# Patient Record
Sex: Male | Born: 1935 | Race: White | Hispanic: No | Smoking: Former smoker
Health system: Southern US, Community
[De-identification: ages and names within clinical notes are randomized; demographics above are authoritative.]

## PROBLEM LIST (undated history)

## (undated) DIAGNOSIS — E785 Hyperlipidemia, unspecified: Secondary | ICD-10-CM

## (undated) DIAGNOSIS — E119 Type 2 diabetes mellitus without complications: Secondary | ICD-10-CM

## (undated) DIAGNOSIS — I6529 Occlusion and stenosis of unspecified carotid artery: Secondary | ICD-10-CM

## (undated) DIAGNOSIS — G309 Alzheimer's disease, unspecified: Secondary | ICD-10-CM

## (undated) DIAGNOSIS — M109 Gout, unspecified: Principal | ICD-10-CM

## (undated) DIAGNOSIS — F419 Anxiety disorder, unspecified: Secondary | ICD-10-CM

## (undated) DIAGNOSIS — I639 Cerebral infarction, unspecified: Secondary | ICD-10-CM

## (undated) DIAGNOSIS — C801 Malignant (primary) neoplasm, unspecified: Secondary | ICD-10-CM

## (undated) DIAGNOSIS — I4891 Unspecified atrial fibrillation: Secondary | ICD-10-CM

## (undated) DIAGNOSIS — F0280 Dementia in other diseases classified elsewhere without behavioral disturbance: Secondary | ICD-10-CM

## (undated) DIAGNOSIS — F0390 Unspecified dementia without behavioral disturbance: Secondary | ICD-10-CM

## (undated) DIAGNOSIS — I10 Essential (primary) hypertension: Secondary | ICD-10-CM

## (undated) DIAGNOSIS — I2510 Atherosclerotic heart disease of native coronary artery without angina pectoris: Secondary | ICD-10-CM

## (undated) HISTORY — DX: Unspecified atrial fibrillation: I48.91

## (undated) HISTORY — DX: Cerebral infarction, unspecified: I63.9

## (undated) HISTORY — DX: Essential (primary) hypertension: I10

## (undated) HISTORY — PX: PEG PLACEMENT: SHX5437

## (undated) HISTORY — PX: CHOLECYSTECTOMY: SHX55

## (undated) HISTORY — DX: Hyperlipidemia, unspecified: E78.5

## (undated) HISTORY — DX: Malignant (primary) neoplasm, unspecified: C80.1

## (undated) HISTORY — DX: Atherosclerotic heart disease of native coronary artery without angina pectoris: I25.10

## (undated) HISTORY — DX: Unspecified dementia, unspecified severity, without behavioral disturbance, psychotic disturbance, mood disturbance, and anxiety: F03.90

## (undated) HISTORY — DX: Gout, unspecified: M10.9

## (undated) HISTORY — DX: Anxiety disorder, unspecified: F41.9

## (undated) HISTORY — PX: CAROTID ENDARTERECTOMY: SUR193

## (undated) HISTORY — DX: Occlusion and stenosis of unspecified carotid artery: I65.29

## (undated) HISTORY — DX: Type 2 diabetes mellitus without complications: E11.9

---

## 1994-02-04 HISTORY — PX: ENDARTERECTOMY: SHX5162

## 1995-07-06 HISTORY — PX: OTHER SURGICAL HISTORY: SHX169

## 1997-02-04 ENCOUNTER — Encounter: Payer: Self-pay | Admitting: Family Medicine

## 1997-02-04 LAB — HM SIGMOIDOSCOPY

## 1997-02-04 LAB — CONVERTED CEMR LAB: PSA: 0.4 ng/mL

## 1998-04-05 ENCOUNTER — Encounter: Payer: Self-pay | Admitting: Family Medicine

## 1998-04-05 LAB — CONVERTED CEMR LAB: PSA: 0.4 ng/mL

## 1999-06-05 ENCOUNTER — Encounter: Payer: Self-pay | Admitting: Family Medicine

## 2000-05-05 ENCOUNTER — Encounter: Payer: Self-pay | Admitting: Family Medicine

## 2001-06-03 ENCOUNTER — Emergency Department (HOSPITAL_COMMUNITY): Admission: EM | Admit: 2001-06-03 | Discharge: 2001-06-03 | Payer: Self-pay | Admitting: Emergency Medicine

## 2001-06-04 ENCOUNTER — Encounter: Payer: Self-pay | Admitting: Family Medicine

## 2001-06-04 LAB — CONVERTED CEMR LAB: PSA: 0.4 ng/mL

## 2001-07-07 ENCOUNTER — Encounter: Payer: Self-pay | Admitting: Vascular Surgery

## 2001-07-08 ENCOUNTER — Ambulatory Visit (HOSPITAL_COMMUNITY): Admission: RE | Admit: 2001-07-08 | Discharge: 2001-07-08 | Payer: Self-pay | Admitting: Vascular Surgery

## 2002-03-09 ENCOUNTER — Encounter: Payer: Self-pay | Admitting: Vascular Surgery

## 2002-03-11 ENCOUNTER — Encounter (INDEPENDENT_AMBULATORY_CARE_PROVIDER_SITE_OTHER): Payer: Self-pay | Admitting: *Deleted

## 2002-03-11 ENCOUNTER — Inpatient Hospital Stay (HOSPITAL_COMMUNITY): Admission: RE | Admit: 2002-03-11 | Discharge: 2002-03-12 | Payer: Self-pay | Admitting: Vascular Surgery

## 2002-06-05 ENCOUNTER — Encounter: Payer: Self-pay | Admitting: Family Medicine

## 2002-08-16 ENCOUNTER — Encounter: Payer: Self-pay | Admitting: Gastroenterology

## 2002-08-16 DIAGNOSIS — D126 Benign neoplasm of colon, unspecified: Secondary | ICD-10-CM

## 2003-06-05 ENCOUNTER — Encounter: Payer: Self-pay | Admitting: Family Medicine

## 2003-12-16 ENCOUNTER — Ambulatory Visit: Payer: Self-pay | Admitting: Family Medicine

## 2004-01-27 ENCOUNTER — Ambulatory Visit: Payer: Self-pay | Admitting: Family Medicine

## 2004-06-04 ENCOUNTER — Encounter: Payer: Self-pay | Admitting: Family Medicine

## 2004-06-04 LAB — CONVERTED CEMR LAB: PSA: 0.44 ng/mL

## 2004-06-07 ENCOUNTER — Ambulatory Visit: Payer: Self-pay | Admitting: Family Medicine

## 2004-06-13 ENCOUNTER — Ambulatory Visit: Payer: Self-pay | Admitting: Family Medicine

## 2004-07-24 ENCOUNTER — Ambulatory Visit: Payer: Self-pay | Admitting: Family Medicine

## 2004-08-11 ENCOUNTER — Emergency Department (HOSPITAL_COMMUNITY): Admission: EM | Admit: 2004-08-11 | Discharge: 2004-08-11 | Payer: Self-pay | Admitting: Emergency Medicine

## 2004-08-14 ENCOUNTER — Ambulatory Visit: Payer: Self-pay | Admitting: Family Medicine

## 2004-09-04 ENCOUNTER — Ambulatory Visit: Payer: Self-pay | Admitting: Family Medicine

## 2004-09-04 ENCOUNTER — Ambulatory Visit: Payer: Self-pay | Admitting: Professional

## 2004-09-06 ENCOUNTER — Ambulatory Visit: Payer: Self-pay | Admitting: Family Medicine

## 2004-09-11 ENCOUNTER — Ambulatory Visit: Payer: Self-pay | Admitting: Professional

## 2004-11-02 ENCOUNTER — Ambulatory Visit: Payer: Self-pay | Admitting: Family Medicine

## 2004-11-06 ENCOUNTER — Ambulatory Visit: Payer: Self-pay | Admitting: Family Medicine

## 2005-01-14 ENCOUNTER — Ambulatory Visit: Payer: Self-pay | Admitting: Family Medicine

## 2005-02-07 ENCOUNTER — Ambulatory Visit: Payer: Self-pay | Admitting: Family Medicine

## 2005-02-12 ENCOUNTER — Ambulatory Visit: Payer: Self-pay | Admitting: Family Medicine

## 2005-03-08 ENCOUNTER — Ambulatory Visit: Payer: Self-pay | Admitting: Family Medicine

## 2005-06-12 ENCOUNTER — Ambulatory Visit: Payer: Self-pay | Admitting: Family Medicine

## 2005-06-12 LAB — CONVERTED CEMR LAB
Hgb A1c MFr Bld: 5.6 %
Microalbumin U total vol: 9.7 mg/L
PSA: 0.81 ng/mL

## 2005-06-14 ENCOUNTER — Ambulatory Visit: Payer: Self-pay | Admitting: Family Medicine

## 2005-07-02 ENCOUNTER — Ambulatory Visit: Payer: Self-pay | Admitting: Family Medicine

## 2005-12-16 ENCOUNTER — Ambulatory Visit: Payer: Self-pay | Admitting: Family Medicine

## 2006-06-16 ENCOUNTER — Encounter: Payer: Self-pay | Admitting: Family Medicine

## 2006-06-16 DIAGNOSIS — E785 Hyperlipidemia, unspecified: Secondary | ICD-10-CM

## 2006-06-16 DIAGNOSIS — F528 Other sexual dysfunction not due to a substance or known physiological condition: Secondary | ICD-10-CM

## 2006-06-16 DIAGNOSIS — I2510 Atherosclerotic heart disease of native coronary artery without angina pectoris: Secondary | ICD-10-CM

## 2006-06-16 DIAGNOSIS — Z862 Personal history of diseases of the blood and blood-forming organs and certain disorders involving the immune mechanism: Secondary | ICD-10-CM

## 2006-06-16 DIAGNOSIS — Z87898 Personal history of other specified conditions: Secondary | ICD-10-CM

## 2006-06-16 DIAGNOSIS — Z8639 Personal history of other endocrine, nutritional and metabolic disease: Secondary | ICD-10-CM

## 2006-06-16 DIAGNOSIS — F411 Generalized anxiety disorder: Secondary | ICD-10-CM

## 2006-06-16 DIAGNOSIS — I10 Essential (primary) hypertension: Secondary | ICD-10-CM

## 2006-06-16 DIAGNOSIS — I6529 Occlusion and stenosis of unspecified carotid artery: Secondary | ICD-10-CM

## 2006-06-18 ENCOUNTER — Ambulatory Visit: Payer: Self-pay | Admitting: Family Medicine

## 2006-06-18 DIAGNOSIS — E119 Type 2 diabetes mellitus without complications: Secondary | ICD-10-CM

## 2006-06-18 DIAGNOSIS — N400 Enlarged prostate without lower urinary tract symptoms: Secondary | ICD-10-CM

## 2006-06-18 HISTORY — DX: Type 2 diabetes mellitus without complications: E11.9

## 2006-06-18 LAB — CONVERTED CEMR LAB
ALT: 23 units/L (ref 0–40)
AST: 27 units/L (ref 0–37)
Albumin: 3.9 g/dL (ref 3.5–5.2)
BUN: 24 mg/dL — ABNORMAL HIGH (ref 6–23)
Basophils Absolute: 0.1 10*3/uL (ref 0.0–0.1)
Bilirubin, Direct: 0.1 mg/dL (ref 0.0–0.3)
Calcium: 9.3 mg/dL (ref 8.4–10.5)
Chloride: 103 meq/L (ref 96–112)
Cholesterol: 189 mg/dL (ref 0–200)
Eosinophils Absolute: 0.3 10*3/uL (ref 0.0–0.6)
GFR calc Af Amer: 68 mL/min
GFR calc non Af Amer: 57 mL/min
Glucose, Bld: 114 mg/dL — ABNORMAL HIGH (ref 70–99)
HDL: 29.3 mg/dL — ABNORMAL LOW (ref >39.0)
Lymphocytes Relative: 21.6 % (ref 12.0–46.0)
MCHC: 34.3 g/dL (ref 30.0–36.0)
MCV: 90.9 fL (ref 78.0–100.0)
Microalb Creat Ratio: 7.5 mg/g (ref 0.0–30.0)
Monocytes Relative: 14.4 % — ABNORMAL HIGH (ref 3.0–11.0)
Neutro Abs: 4.2 10*3/uL (ref 1.4–7.7)
PSA: 0.73 ng/mL (ref 0.10–4.00)
Platelets: 247 10*3/uL (ref 150–400)
RBC: 4.86 M/uL (ref 4.22–5.81)
TSH: 2.28 microintl units/mL (ref 0.35–5.50)
Total CHOL/HDL Ratio: 6.5
Triglycerides: 182 mg/dL — ABNORMAL HIGH (ref 0–149)
WBC: 7.3 10*3/uL (ref 4.5–10.5)

## 2006-06-20 ENCOUNTER — Ambulatory Visit: Payer: Self-pay | Admitting: Family Medicine

## 2006-06-22 LAB — CONVERTED CEMR LAB: OCCULT 3: NEGATIVE

## 2006-06-23 LAB — CONVERTED CEMR LAB: OCCULT 2: NEGATIVE

## 2006-06-24 ENCOUNTER — Ambulatory Visit: Payer: Self-pay | Admitting: Vascular Surgery

## 2006-07-07 ENCOUNTER — Ambulatory Visit: Payer: Self-pay | Admitting: Family Medicine

## 2006-12-17 ENCOUNTER — Encounter (INDEPENDENT_AMBULATORY_CARE_PROVIDER_SITE_OTHER): Payer: Self-pay | Admitting: Internal Medicine

## 2006-12-17 ENCOUNTER — Emergency Department (HOSPITAL_COMMUNITY): Admission: EM | Admit: 2006-12-17 | Discharge: 2006-12-17 | Payer: Self-pay | Admitting: Orthopedic Surgery

## 2006-12-18 ENCOUNTER — Ambulatory Visit: Payer: Self-pay | Admitting: Family Medicine

## 2006-12-18 DIAGNOSIS — R42 Dizziness and giddiness: Secondary | ICD-10-CM | POA: Insufficient documentation

## 2006-12-24 ENCOUNTER — Ambulatory Visit: Payer: Self-pay | Admitting: Family Medicine

## 2006-12-26 ENCOUNTER — Telehealth: Payer: Self-pay | Admitting: Family Medicine

## 2006-12-29 ENCOUNTER — Encounter (INDEPENDENT_AMBULATORY_CARE_PROVIDER_SITE_OTHER): Payer: Self-pay | Admitting: *Deleted

## 2007-01-27 ENCOUNTER — Telehealth: Payer: Self-pay | Admitting: Family Medicine

## 2007-04-13 ENCOUNTER — Ambulatory Visit: Payer: Self-pay | Admitting: Family Medicine

## 2007-04-13 DIAGNOSIS — M545 Low back pain: Secondary | ICD-10-CM

## 2007-04-14 ENCOUNTER — Encounter: Admission: RE | Admit: 2007-04-14 | Discharge: 2007-04-14 | Payer: Self-pay | Admitting: Family Medicine

## 2007-06-05 HISTORY — PX: OTHER SURGICAL HISTORY: SHX169

## 2007-06-23 ENCOUNTER — Telehealth: Payer: Self-pay | Admitting: Family Medicine

## 2007-06-23 ENCOUNTER — Ambulatory Visit: Payer: Self-pay | Admitting: Vascular Surgery

## 2007-06-24 ENCOUNTER — Ambulatory Visit: Payer: Self-pay | Admitting: Family Medicine

## 2007-07-09 ENCOUNTER — Ambulatory Visit: Payer: Self-pay | Admitting: Family Medicine

## 2007-09-04 ENCOUNTER — Ambulatory Visit: Payer: Self-pay | Admitting: Gastroenterology

## 2007-09-04 DIAGNOSIS — K219 Gastro-esophageal reflux disease without esophagitis: Secondary | ICD-10-CM

## 2007-09-16 LAB — HM COLONOSCOPY

## 2007-09-17 ENCOUNTER — Encounter: Payer: Self-pay | Admitting: Gastroenterology

## 2007-09-17 ENCOUNTER — Ambulatory Visit: Payer: Self-pay | Admitting: Gastroenterology

## 2007-09-22 ENCOUNTER — Encounter: Payer: Self-pay | Admitting: Gastroenterology

## 2007-11-04 ENCOUNTER — Telehealth: Payer: Self-pay | Admitting: Family Medicine

## 2007-12-02 LAB — HM DIABETES EYE EXAM

## 2007-12-07 ENCOUNTER — Ambulatory Visit: Payer: Self-pay | Admitting: Family Medicine

## 2007-12-07 LAB — CONVERTED CEMR LAB
ALT: 19 units/L (ref 0–53)
AST: 25 units/L (ref 0–37)
Albumin: 4.1 g/dL (ref 3.5–5.2)
Alkaline Phosphatase: 71 units/L (ref 39–117)
BUN: 12 mg/dL (ref 6–23)
Basophils Relative: 0.4 % (ref 0.0–3.0)
Chloride: 95 meq/L — ABNORMAL LOW (ref 96–112)
Creatinine, Ser: 1.1 mg/dL (ref 0.4–1.5)
Eosinophils Relative: 2.5 % (ref 0.0–5.0)
Glucose, Bld: 103 mg/dL — ABNORMAL HIGH (ref 70–99)
HCT: 48.1 % (ref 39.0–52.0)
HDL: 40.3 mg/dL (ref >39.0)
Monocytes Relative: 9.3 % (ref 3.0–12.0)
Neutrophils Relative %: 67.7 % (ref 43.0–77.0)
PSA: 0.65 ng/mL (ref 0.10–4.00)
Platelets: 266 10*3/uL (ref 150–400)
Potassium: 4.4 meq/L (ref 3.5–5.1)
RBC: 5.13 M/uL (ref 4.22–5.81)
Total CHOL/HDL Ratio: 4.6
Total Protein: 7.6 g/dL (ref 6.0–8.3)
VLDL: 32 mg/dL (ref 0–40)
WBC: 8.4 10*3/uL (ref 4.5–10.5)

## 2007-12-10 ENCOUNTER — Ambulatory Visit: Payer: Self-pay | Admitting: Family Medicine

## 2007-12-24 ENCOUNTER — Encounter: Payer: Self-pay | Admitting: Family Medicine

## 2008-01-19 ENCOUNTER — Ambulatory Visit: Payer: Self-pay | Admitting: Vascular Surgery

## 2008-02-26 ENCOUNTER — Ambulatory Visit: Payer: Self-pay | Admitting: Family Medicine

## 2008-03-22 ENCOUNTER — Encounter: Payer: Self-pay | Admitting: Family Medicine

## 2008-05-05 ENCOUNTER — Telehealth: Payer: Self-pay | Admitting: Family Medicine

## 2008-06-02 ENCOUNTER — Telehealth: Payer: Self-pay | Admitting: Family Medicine

## 2008-10-06 ENCOUNTER — Telehealth: Payer: Self-pay | Admitting: Family Medicine

## 2008-12-09 ENCOUNTER — Ambulatory Visit: Payer: Self-pay | Admitting: Family Medicine

## 2008-12-09 LAB — CONVERTED CEMR LAB
ALT: 14 units/L (ref 0–53)
AST: 19 units/L (ref 0–37)
BUN: 16 mg/dL (ref 6–23)
Basophils Relative: 0.5 % (ref 0.0–3.0)
CO2: 30 meq/L (ref 19–32)
Chloride: 97 meq/L (ref 96–112)
Cholesterol: 136 mg/dL (ref 0–200)
Creatinine,U: 117.1 mg/dL
Eosinophils Absolute: 0.3 10*3/uL (ref 0.0–0.7)
Glucose, Bld: 104 mg/dL — ABNORMAL HIGH (ref 70–99)
Hemoglobin: 14.7 g/dL (ref 13.0–17.0)
LDL Cholesterol: 69 mg/dL (ref 0–99)
Lymphocytes Relative: 22.1 % (ref 12.0–46.0)
MCHC: 35.2 g/dL (ref 30.0–36.0)
Microalb Creat Ratio: 17.9 mg/g (ref 0.0–30.0)
Monocytes Relative: 11.7 % (ref 3.0–12.0)
Neutro Abs: 5.1 10*3/uL (ref 1.4–7.7)
Potassium: 4.4 meq/L (ref 3.5–5.1)
RBC: 4.45 M/uL (ref 4.22–5.81)
Total Bilirubin: 1.2 mg/dL (ref 0.3–1.2)
Total Protein: 7.3 g/dL (ref 6.0–8.3)

## 2008-12-11 LAB — CONVERTED CEMR LAB: Vit D, 25-Hydroxy: 29 ng/mL — ABNORMAL LOW (ref 30–89)

## 2008-12-13 ENCOUNTER — Ambulatory Visit: Payer: Self-pay | Admitting: Family Medicine

## 2009-02-01 ENCOUNTER — Ambulatory Visit: Payer: Self-pay | Admitting: Vascular Surgery

## 2009-02-07 ENCOUNTER — Telehealth: Payer: Self-pay | Admitting: Family Medicine

## 2009-03-14 ENCOUNTER — Ambulatory Visit: Payer: Self-pay | Admitting: Family Medicine

## 2009-03-30 ENCOUNTER — Ambulatory Visit: Payer: Self-pay | Admitting: Family Medicine

## 2009-03-30 DIAGNOSIS — R5381 Other malaise: Secondary | ICD-10-CM

## 2009-03-30 DIAGNOSIS — R5383 Other fatigue: Secondary | ICD-10-CM

## 2009-03-30 DIAGNOSIS — R011 Cardiac murmur, unspecified: Secondary | ICD-10-CM

## 2009-03-30 LAB — CONVERTED CEMR LAB
Eosinophils Relative: 2.1 % (ref 0.0–5.0)
Glucose, Bld: 108 mg/dL — ABNORMAL HIGH (ref 70–99)
Lymphocytes Relative: 19.4 % (ref 12.0–46.0)
Monocytes Relative: 10.9 % (ref 3.0–12.0)
Neutrophils Relative %: 67.4 % (ref 43.0–77.0)
Platelets: 223 10*3/uL (ref 150.0–400.0)
WBC: 9.8 10*3/uL (ref 4.5–10.5)

## 2009-04-13 ENCOUNTER — Ambulatory Visit: Payer: Self-pay | Admitting: Family Medicine

## 2009-04-13 DIAGNOSIS — F0390 Unspecified dementia without behavioral disturbance: Secondary | ICD-10-CM | POA: Insufficient documentation

## 2009-04-13 DIAGNOSIS — H9209 Otalgia, unspecified ear: Secondary | ICD-10-CM | POA: Insufficient documentation

## 2009-04-17 ENCOUNTER — Telehealth: Payer: Self-pay | Admitting: Family Medicine

## 2009-05-04 ENCOUNTER — Ambulatory Visit: Payer: Self-pay | Admitting: Family Medicine

## 2009-05-10 ENCOUNTER — Telehealth: Payer: Self-pay | Admitting: Family Medicine

## 2009-05-16 ENCOUNTER — Telehealth: Payer: Self-pay | Admitting: Family Medicine

## 2009-05-17 ENCOUNTER — Ambulatory Visit: Payer: Self-pay | Admitting: Family Medicine

## 2009-05-18 ENCOUNTER — Encounter: Payer: Self-pay | Admitting: Family Medicine

## 2009-05-24 ENCOUNTER — Ambulatory Visit: Payer: Self-pay | Admitting: Family Medicine

## 2009-05-24 ENCOUNTER — Telehealth: Payer: Self-pay | Admitting: Family Medicine

## 2009-07-04 ENCOUNTER — Ambulatory Visit: Payer: Self-pay | Admitting: Family Medicine

## 2009-07-26 ENCOUNTER — Ambulatory Visit: Payer: Self-pay | Admitting: Family Medicine

## 2009-09-11 ENCOUNTER — Telehealth: Payer: Self-pay | Admitting: Family Medicine

## 2009-09-12 ENCOUNTER — Encounter (INDEPENDENT_AMBULATORY_CARE_PROVIDER_SITE_OTHER): Payer: Self-pay | Admitting: *Deleted

## 2009-10-22 ENCOUNTER — Emergency Department (HOSPITAL_COMMUNITY): Admission: EM | Admit: 2009-10-22 | Discharge: 2009-10-22 | Payer: Self-pay | Admitting: Family Medicine

## 2009-10-26 ENCOUNTER — Ambulatory Visit: Payer: Self-pay | Admitting: Family Medicine

## 2009-10-26 DIAGNOSIS — K644 Residual hemorrhoidal skin tags: Secondary | ICD-10-CM | POA: Insufficient documentation

## 2009-11-02 ENCOUNTER — Telehealth: Payer: Self-pay | Admitting: Family Medicine

## 2010-01-03 ENCOUNTER — Ambulatory Visit: Payer: Self-pay | Admitting: Family Medicine

## 2010-01-11 ENCOUNTER — Telehealth: Payer: Self-pay | Admitting: Family Medicine

## 2010-02-04 DIAGNOSIS — M109 Gout, unspecified: Secondary | ICD-10-CM

## 2010-02-04 HISTORY — DX: Gout, unspecified: M10.9

## 2010-02-12 ENCOUNTER — Ambulatory Visit
Admission: RE | Admit: 2010-02-12 | Discharge: 2010-02-12 | Payer: Self-pay | Source: Home / Self Care | Attending: Family Medicine | Admitting: Family Medicine

## 2010-02-12 DIAGNOSIS — J209 Acute bronchitis, unspecified: Secondary | ICD-10-CM | POA: Insufficient documentation

## 2010-02-21 ENCOUNTER — Ambulatory Visit
Admission: RE | Admit: 2010-02-21 | Discharge: 2010-02-21 | Payer: Self-pay | Source: Home / Self Care | Attending: Family Medicine | Admitting: Family Medicine

## 2010-02-23 ENCOUNTER — Encounter
Admission: RE | Admit: 2010-02-23 | Discharge: 2010-02-23 | Payer: Self-pay | Source: Home / Self Care | Attending: Family Medicine | Admitting: Family Medicine

## 2010-02-23 HISTORY — PX: OTHER SURGICAL HISTORY: SHX169

## 2010-03-06 ENCOUNTER — Ambulatory Visit: Admit: 2010-03-06 | Payer: Self-pay | Admitting: Vascular Surgery

## 2010-03-06 ENCOUNTER — Ambulatory Visit
Admission: RE | Admit: 2010-03-06 | Discharge: 2010-03-06 | Payer: Self-pay | Source: Home / Self Care | Attending: Vascular Surgery | Admitting: Vascular Surgery

## 2010-03-07 NOTE — Assessment & Plan Note (Signed)
OFFICE VISIT  ROI, JAFARI DOB:  [birth year]                                       03/06/2010 [id number]#:[id number]  This patient returns for continued follow-up regarding his carotid occlusive disease.  He has a long history of vascular issues having had a right carotid endarterectomy by me in 1996 with a redo in 2004 and a left carotid endarterectomy in 1996.  Since that time he has not shown any evidence of recurrent stenosis.  His family very concerned about progression of symptoms such as memory loss and instability but no specific hemispheric TIAs such as hemiparesis, aphasia, amaurosis fugax, diplopia, blurred vision or syncope.  He had a recent MRI study performed, ordered by Dr. Hetty Ely.  I have reviewed the report.  This reveals chronic cerebellar small vessel and medium sized vessel ischemia but no evidence of acute stroke or hemorrhage. He does have some generalized volume loss.  CHRONIC MEDICAL PROBLEMS: 1. Coronary artery disease, previous coronary bypass grafting 1997. 2. Hypertension. 3. History of anxiety. 4. Hyperlipidemia. 5. Negative for diabetes or COPD.  SOCIAL HISTORY:  He is widowed, has 5 children and has not smoked since 1965.  He does not use alcohol. Marland Kitchen  FAMILY HISTORY:  Mother died of gangrene, father died age 11 of heart failure.  REVIEW OF SYSTEMS:  He denies any chest pain, dyspnea on exertion.  Does admit to some instability with ambulation, memory loss.  No hemoptysis or chronic bronchitis.  Does have anxiety.  All other systems are negative in the review of systems.  PHYSICAL EXAMINATION:  Blood pressure 128/70, heart rate 62, respirations 12.  General:  Well-developed, elderly male who is in no apparent distress, alert and  oriented x3.  HEENT:  Exam is normal. EOMs intact.  Lungs:  Clear to auscultation.  No rhonchi or wheezing. Cardiovascular:  Regular rhythm, no murmurs.  Carotid pulses 3+.  Soft bruit on the  right.  Abdomen:  Soft, nontender with no masses. Musculoskeletal:  Exam is free of major deformities.  Neurologic: Reveals a slow response to questioning and no specific lesions.  Skin: Free of rashes.  I ordered a carotid duplex exam today which reveals some mild increased velocities in the right common carotid artery but otherwise no significant flow reduction noted.  I have reassured the family regarding these findings and the fact that he would need a neurologic evaluation regarding his memory loss and small vessel intracerebral disease.  We will see him on an annual basis to follow his extracranial cerebral circulation unless he develops any new hemispheric symptoms.    Quita Skye Hart [place], M.D. Electronically Signed  JDL/MEDQ  D:  03/06/2010  T:  03/07/2010  Job:  [id number]

## 2010-03-08 ENCOUNTER — Telehealth: Payer: Self-pay | Admitting: Family Medicine

## 2010-03-08 NOTE — Assessment & Plan Note (Signed)
Summary: 1 MONTH FOLLOW UP/[facility]   Vital Signs:  Patient profile:   77 year old male Weight:      176.75 pounds Temp:     98.1 degrees F oral Pulse rate:   64 / minute Pulse rhythm:   regular BP sitting:   148 / 74  (left arm) Cuff size:   large  Vitals Entered By: Sydell Axon LPN (Jul 04, 2009 11:58 AM) CC: One month follow-up, had to stop Aricept because he upset his stomach   History of Present Illness: Pt here with son for recheck of Alzheimers. He was unable to tolerated the Aricept as it made him nauseous no matter when or with what it was given. He is back to nornal with GI upset now having been off the medication now for two weeks. He has been contacted by [facility].  Problems Prior to Update: 1)  Dementia  (ICD-294.8) 2)  Ear Pain, Right  (ICD-388.70) 3)  Systolic Murmur  (ICD-785.2) 4)  Pharyngitis  (ICD-462) 5)  Fatigue  (ICD-780.79) 6)  Esophageal Reflux  (ICD-530.81) 7)  Fm Hx Malignant Neoplasm Gastrointestinal Tract  (ICD-V16.0) 8)  Polyp, Colon  (ICD-211.3) 9)  Low Back Pain, Chronic  (ICD-724.2) 10)  Dizziness  (ICD-780.4) 11)  Screening For Malignannt Neoplasm, Site Nec  (ICD-V76.49) 12)  Disorder, Carbohydrate Metabolism Nos  (ICD-271.9) 13)  Hyprtrphy Prostate Bng w/o Urinary Obst/luts  (ICD-600.00) 14)  Erectile Dysfunction  (ICD-302.72) 15)  Benign Prostatic Hypertrophy, Hx of  (ICD-V13.8) 16)  Glucose Intolerance, Hx of  (ICD-V12.2) 17)  Diverticulosis, Colon Diverticula At 30 Cm On Flex  (ICD-562.10) 18)  Carotid Artery Disease  (ICD-433.10) 19)  Allergic Reaction (CASHEWS)  (ICD-995.3) 20)  Hypertension  (ICD-401.9) 21)  Hyperlipidemia With Cad, Elevated Trig  (ICD-272.4) 22)  Coronary Artery Disease  (ICD-414.00) 23)  Anxiety  (ICD-300.00)  Medications Prior to Update: 1)  Sertraline Hcl 50 Mg Tabs (Sertraline Hcl) .... Take 1 1/2 Tablet By Mouth Once A Day 2)  Xanax 0.25 Mg Tabs (Alprazolam) .... Take One By Mouth Three Times A Day 3)   Metoprolol Tartrate 50 Mg Tabs (Metoprolol Tartrate) .... Take  1 1/2 By Mouth Two Times A Day 4)  Hydrochlorothiazide 12.5 Mg Caps (Hydrochlorothiazide) .... Take One By Mouth Daily 5)  Viagra 50 Mg Tabs (Sildenafil Citrate) 6)  Efudex 5 % Crea (Fluorouracil) .... Aplly Two Times A Day As Needed 7)  Simvastatin 80 Mg Tabs (Simvastatin) .... One Tab By Mouth At Night 8)  Aspirin 81 Mg Tbec (Aspirin) .... Take One By Mouth Daily 9)  Vicodin 5-500 Mg Tabs (Hydrocodone-Acetaminophen) .... One Tsab By Mouth Two Times A Day 10)  Prednisone 10 Mg Tabs (Prednisone) .... 6 Tabs By Mouth in Am For 6 Days, Then Decrease By 1 Pill Every Three Days Until  1/2 Pill For 6 Days. 11)  Aricept 5 Mg Tabs (Donepezil Hcl) .... One Tab By Mouth Once Daily  Allergies: 1)  ! * Aleve 2)  * Sulfa (Sulfonamides) Group 3)  Altace (Ramipril) 4)  Niaspan (Niacin (Antihyperlipidemic))  Physical Exam  General:  Well-developed,well-nourished,in mild  distress from right ear pain ; alert,appropriate and cooperative throughout examination Head:  Normocephalic and atraumatic without obvious abnormalities. No apparent alopecia but male pattern balding. Sinuses NT.  Eyes:  No corneal or conjunctival inflammation noted. EOMI. PERRLA. Ears:  External ear exam shows no significant lesions or deformities.  Otoscopic examination reveals clear canals, tympanic membranes are intact bilaterally without bulging  or discharge. TMs are dull w/o erythema but have poor mobility.  Hearing is grossly normal bilaterally. I see no tympanic rupture. Nose:  External nasal examination shows no deformity or inflammation. Nasal mucosa are pink and moist without lesions or exudates. Mouth:  Oral mucosa and oropharynx without lesions or exudates.  Teeth in good repair.  Neck:  no carotid bruit or thyromegaly no cervical or supraclavicular lymphadenopathy  Lungs:  Normal respiratory effort, chest expands symmetrically. Lungs are clear to auscultation,  no crackles or wheezes. Heart:  Normal rate and regular rhythm. S1 and S2 normal without gallop, click, rub. Early II-III/VI sys murmur heard throughout. Abdomen:  Bowel sounds positive,abdomen soft and non-tender without masses, organomegaly or hernias noted.   Impression & Recommendations:  Problem # 1:  DEMENTIA (ICD-294.8) Assessment Unchanged Unable to tolerate Aricept. Will try Namenda alone as Exaelon also mentions GI upset. Given start kit. Script sent in.  Problem # 2:  HYPERTENSION (ICD-401.9) Assessment: Unchanged Think this white coat as he checks athome and is very good. Will follow. His updated medication list for this problem includes:    Metoprolol Tartrate 50 Mg Tabs (Metoprolol tartrate) .Marland Kitchen... Take  1 1/2 by mouth two times a day    Hydrochlorothiazide 12.5 Mg Caps (Hydrochlorothiazide) .Marland Kitchen... Take one by mouth daily  BP today: 148/74 Prior BP: 150/74 (05/24/2009)  Labs Reviewed: K+: 4.4 (12/09/2008) Creat: : 1.3 (12/09/2008)   Chol: 136 (12/09/2008)   HDL: 35.60 (12/09/2008)   LDL: 69 (12/09/2008)   TG: 159.0 (12/09/2008)  Complete Medication List: 1)  Sertraline Hcl 50 Mg Tabs (Sertraline hcl) .... Take 1 1/2 tablet by mouth once a day 2)  Xanax 0.25 Mg Tabs (Alprazolam) .... Take one by mouth three times a day 3)  Metoprolol Tartrate 50 Mg Tabs (Metoprolol tartrate) .... Take  1 1/2 by mouth two times a day 4)  Hydrochlorothiazide 12.5 Mg Caps (Hydrochlorothiazide) .... Take one by mouth daily 5)  Viagra 50 Mg Tabs (Sildenafil citrate) 6)  Efudex 5 % Crea (Fluorouracil) .... Aplly two times a day as needed 7)  Simvastatin 80 Mg Tabs (Simvastatin) .... One tab by mouth at night 8)  Aspirin 81 Mg Tbec (Aspirin) .... Take one by mouth daily 9)  Vicodin 5-500 Mg Tabs (Hydrocodone-acetaminophen) .... One tsab by mouth two times a day 10)  Namenda 10 Mg Tabs (Memantine hcl) .... One tab by mouth two times a day  Patient Instructions: 1)  Start Namenda. 2)  RTC in  Oct for recheck, sooner as needed. Prescriptions: NAMENDA 10 MG TABS (MEMANTINE HCL) one tab by mouth two times a day  #60 x 12   Entered and Authorized by:   Shaune Leeks MD   Signed by:   Shaune Leeks MD on 07/04/2009   Method used:   Electronically to        [name] (retail)       [address]       [place], [place]  [id number]       Ph: [phone]       Fax: [phone]   RxID:   [id number]   Current Allergies (reviewed today): ! * ALEVE * SULFA (SULFONAMIDES) GROUP ALTACE (RAMIPRIL) NIASPAN (NIACIN (ANTIHYPERLIPIDEMIC))

## 2010-03-08 NOTE — Progress Notes (Signed)
Summary: refill request for xanax  Phone Note Refill Request Message from:  son [name] [phone]  Refills Requested: Medication #1:  [name] 0.25 MG TABS Take one by mouth three times a day [facility]  Initial call taken by: Lowella Petties CMA, AAMA,  January 11, 2010 2:41 PM  Follow-up for Phone Call        Called to pharmacy. Follow-up by: Lowella Petties CMA, AAMA,  January 11, 2010 3:26 PM    Prescriptions: [name] 0.25 MG TABS (ALPRAZOLAM) Take one by mouth three times a day  #90 x 3   Entered and Authorized by:   Shaune Leeks MD   Signed by:   Shaune Leeks MD on 01/11/2010   Method used:   Telephoned to ...       [name] (retail)       [address]       [place], [place]  [id number]       Ph: [phone]       Fax: [phone]   RxID:   [id number]

## 2010-03-08 NOTE — Progress Notes (Signed)
Summary: Ear pain  Phone Note Call from Patient Call back at Home Phone [phone]   Caller: Patient Call For: Shaune Leeks MD Summary of Call: Patient says that he is having ear pain times 2-3 days, no cold, no sinus problems.  He was seen for this on 03/30/2011and wanted to know if he can get a Rx called in to the pharmacy or if he needs to be seen.  [facility]ses [facility].  Please advise. Initial call taken by: Linde Gillis CMA Duncan Dull),  May 10, 2009 1:33 PM  Follow-up for Phone Call        No answer, LMOM. Will try again later. Shaune Leeks MD  May 10, 2009 5:20 PM  Spoke with son,. Did well on Prednisone until last night when pain returned. Now wants to start Vicodin again. Will try Predniosone with prolonged taper and use Vicodin sparingly as needed. Follow-up by: Shaune Leeks MD,  May 10, 2009 6:13 PM    New/Updated Medications: PREDNISONE 10 MG TABS (PREDNISONE) 6 tabs by mouth in AM for 6 days, then decrease by 1 pill every three days until  1/2 pill for 6 days. Prescriptions: PREDNISONE 10 MG TABS (PREDNISONE) 6 tabs by mouth in AM for 6 days, then decrease by 1 pill every three days until  1/2 pill for 6 days.  #84 x 0   Entered and Authorized by:   Shaune Leeks MD   Signed by:   Shaune Leeks MD on 05/10/2009   Method used:   Electronically to        [name] [facility] (retail)       [address]       [place], [place]  [id number]       Ph: [phone]       Fax: [phone]   RxID:   [id number]

## 2010-03-08 NOTE — Consult Note (Signed)
Summary: Dr.Dwight [facility],Note  Dr[facility]ight [facility],Note   Imported By: Beau Fanny 05/19/2009 15:12:31  _____________________________________________________________________  External Attachment:    Type:   Image     Comment:   External Document

## 2010-03-08 NOTE — Letter (Signed)
Summary: AD8 Dementia Screening Interview  AD8 Dementia Screening Interview   Imported By: Beau Fanny 05/25/2009 08:40:57  _____________________________________________________________________  External Attachment:    Type:   Image     Comment:   External Document

## 2010-03-08 NOTE — Assessment & Plan Note (Signed)
Summary: COUGH, CONGESTION/12:00   Vital Signs:  Patient profile:   77 year old male Height:      62.5 inches Weight:      180.50 pounds BMI:     32.61 O2 Sat:      94 % on Room air Temp:     98.5 degrees F oral Pulse rate:   72 / minute Pulse rhythm:   regular Resp:     24 per minute BP sitting:   128 / 70  (left arm) Cuff size:   large  Vitals Entered By: Lewanda Rife LPN (February 12, 2010 12:15 PM)  O2 Flow:  Room air CC: head and chest congestion and productive cough with clear to yellow phlegm   History of Present Illness: CC: cold sxs  presents with son and daughter.  1+ wk h/o chest congestion and cold.  + productive cough with yellow sputum, RN, ST.  + drainage.  +nausea.  children starting to hear rattle.  Tried mucinex, delsym, BC powders.  Son states dad a bit altered as well (talking at night).  No fevers/chills, abd pain, v/d.  No rashes.  No HA  No sick contacts, no smokers at home.  No h/o asthma.  also concerned about dementia - haven't noticed namenda improve sxs.  daughter feels drastic decline in last year.  son doesn't know if alzheimer's type or other.  Current Medications (verified): 1)  Sertraline Hcl 50 Mg Tabs (Sertraline Hcl) .... Take 1 1/2 Tablet By Mouth Once A Day 2)  Xanax 0.25 Mg Tabs (Alprazolam) .... Take One By Mouth Three Times A Day 3)  Metoprolol Tartrate 50 Mg Tabs (Metoprolol Tartrate) .... Take  1 1/2 By Mouth Two Times A Day 4)  Hydrochlorothiazide 12.5 Mg Caps (Hydrochlorothiazide) .... Take One By Mouth Daily 5)  Efudex 5 % Crea (Fluorouracil) .... Aplly Two Times A Day As Needed 6)  Simvastatin 80 Mg Tabs (Simvastatin) .... One Tab By Mouth At Night 7)  Aspirin 81 Mg Tbec (Aspirin) .... Take One By Mouth Daily 8)  Namenda 10 Mg Tabs (Memantine Hcl) .... One Tab By Mouth Two Times A Day 9)  Proctosol Hc 2.5 % Crea (Hydrocortisone) .... Apply Daily and After Each Bm As Needed 10)  Mucinex 600 Mg Xr12h-Tab (Guaifenesin) .... Otc As  Directed. 11)  Delsym 30 Mg/87ml Lqcr (Dextromethorphan Polistirex) .... Otc As Directed.  Allergies: 1)  ! * Aleve 2)  * Sulfa (Sulfonamides) Group 3)  Altace (Ramipril) 4)  Niaspan (Niacin (Antihyperlipidemic))  Past History:  Past Medical History: Anxiety Coronary artery disease Hyperlipidemia Hypertension Dementia  derm-- Dr Irene Limbo - [facility]  Social History: remote mild smoking hx Marital Status: Married Widow 1991( wife fell in [place], hit head on rock) Children:  5 Occupation: Retired from [employer] Son lives with patient, helps out at home.   Review of Systems       per HPI  Physical Exam  General:  Well-developed,well-nourished,in no acute distress; alert,appropriate and cooperative throughout examination, congested facially with deep voice. Head:  Normocephalic and atraumatic without obvious abnormalities. No apparent alopecia but male pattern balding.  Eyes:  PERRLA, EOMI, no injection Ears:  TMs clear bilaterally Nose:  nares clear bilaterally Mouth:  MMM, no pharyngeal erythema/edema Neck:  no carotid bruit or thyromegaly no cervical or supraclavicular lymphadenopathy  Lungs:  coarse.  + end exp wheezing/rhonchi bilaterally. Heart:  Normal rate and regular rhythm. S1 and S2 normal without gallop, click, rub. Pulses:  2+ rad pulses, good cap refill (<2 sec) Extremities:  no c/c/e Skin:  Intact without suspicious lesions or rashes, signif Aks and benign moles with aging skin changes as well.   Impression & Recommendations:  Problem # 1:  ACUTE BRONCHITIS (ICD-466.0) in elderly with h/o dementia.  no h/o asthma, COPD.  + wheezing so added albuterol inhaler.  cover atypicals with azithromycin.  bronchitis likely causing impairment in rest leading to decreased cognition.  would want to see improvement in bronchitis prior to readdressing dementia, will have pt f/u with pcp.  His updated medication list for this problem includes:    Mucinex 600  Mg Xr12h-tab (Guaifenesin) ..... Otc as directed.    Delsym 30 Mg/33ml Lqcr (Dextromethorphan polistirex) ..... Otc as directed.    Ventolin Hfa 108 (90 Base) Mcg/act Aers (Albuterol sulfate) .Marland Kitchen... 2 puffs q6 hours as needed cough    Zithromax Z-pak 250 Mg Tabs (Azithromycin) .Marland Kitchen... Take as directed  Complete Medication List: 1)  Sertraline Hcl 50 Mg Tabs (Sertraline hcl) .... Take 1 1/2 tablet by mouth once a day 2)  Xanax 0.25 Mg Tabs (Alprazolam) .... Take one by mouth three times a day 3)  Metoprolol Tartrate 50 Mg Tabs (Metoprolol tartrate) .... Take  1 1/2 by mouth two times a day 4)  Hydrochlorothiazide 12.5 Mg Caps (Hydrochlorothiazide) .... Take one by mouth daily 5)  Efudex 5 % Crea (Fluorouracil) .... Aplly two times a day as needed 6)  Simvastatin 80 Mg Tabs (Simvastatin) .... One tab by mouth at night 7)  Aspirin 81 Mg Tbec (Aspirin) .... Take one by mouth daily 8)  Namenda 10 Mg Tabs (Memantine hcl) .... One tab by mouth two times a day 9)  Proctosol Hc 2.5 % Crea (Hydrocortisone) .... Apply daily and after each bm as needed 10)  Mucinex 600 Mg Xr12h-tab (Guaifenesin) .... Otc as directed. 11)  Delsym 30 Mg/47ml Lqcr (Dextromethorphan polistirex) .... Otc as directed. 12)  Ventolin Hfa 108 (90 Base) Mcg/act Aers (Albuterol sulfate) .... 2 puffs q6 hours as needed cough 13)  Zithromax Z-pak 250 Mg Tabs (Azithromycin) .... Take as directed  Patient Instructions: 1)  return in 1-2 for follow up breathing with Dr. Hetty Ely as well as dementia. 2)  Sounds like a bronchitis. 3)  back down on mid day xanax dose. 4)  Take plenty of fluid and plenty of rest over next few days. 5)  Treat with continued mucinex and cheratussin. 6)  Antibiotic to take - zpack as well as albuterol inhaler to use. 7)  Return sooner if any concerns, fever >101.5 or cough worsening Prescriptions: ZITHROMAX Z-PAK 250 MG TABS (AZITHROMYCIN) take as directed  #1 x 0   Entered and Authorized by:   Eustaquio Boyden  MD   Signed by:   Eustaquio Boyden  MD on 02/12/2010   Method used:   Electronically to        [name] ([facility]ail)       [address]       [place], [place]  [id number]       Ph: [phone]       Fax: [phone]   RxID:   [id number] VENTOLIN HFA 108 (90 BASE) MCG/ACT AERS (ALBUTEROL SULFATE) 2 puffs q6 hours as needed cough  #1 x 1   Entered and Authorized by:   Eustaquio Boyden  MD   Signed by:   Eustaquio Boyden  MD on 02/12/2010   Method used:   Electronically  to        [name] ([facility]ail)       [address]       [place], [place]  [id number]       Ph: [phone]       Fax: [phone]   RxID:   [id number]    Orders Added: 1)  Est. Patient Level III [53664]    Current Allergies (reviewed today): ! * ALEVE * SULFA (SULFONAMIDES) GROUP ALTACE (RAMIPRIL) NIASPAN (NIACIN (ANTIHYPERLIPIDEMIC))

## 2010-03-08 NOTE — Progress Notes (Signed)
Summary: refill request for proctosol  Phone Note Refill Request Call back at [phone] Message from:  son  [name] Requested: Medication #1:  PROCTOSOL HC 2.5 % CREA Apply daily and after each BM. [facility] [address]  Initial call taken by: Lowella Petties CMA,  November 02, 2009 12:10 PM  Follow-up for Phone Call       Follow-up by: Crawford Givens MD,  November 02, 2009 1:27 PM    Prescriptions: PROCTOSOL HC 2.5 % CREA (HYDROCORTISONE) Apply daily and after each BM  #30g x 1   Entered and Authorized by:   Crawford Givens MD   Signed by:   Crawford Givens MD on 11/02/2009   Method used:   Electronically to        [name] [address] [address] ([facility]ail)       [address]       [place], [place]  [id number]       Ph: [phone]       Fax: [phone]   RxID:   [id number]

## 2010-03-08 NOTE — Letter (Signed)
Summary: [name] letter  [name] [facility]  [address] [name], [place] [address]   Phone: [phone]  Fax: [phone]       09/12/2009 MRN: [id number]  [name] [address] [place], [place]  [address]  Dear [name], [facility] [place], and [facility] announce the retirement of Arta Silence, M.D., from full-time practice at the [facility] office effective August 03, 2009 and his plans of returning part-time.  It is important to Dr. Hetty Ely and to our practice that you understand that [facility] has seven physicians in our office for your health care needs.  We will continue to offer the same exceptional care that you have today.    Dr. Hetty Ely has spoken to many of you about his plans for retirement and returning part-time in the fall.   We will continue to work with you through the transition to schedule appointments for you in the office and meet the high standards that [facility] is committed to.   Again, it is with great pleasure that we share the news that Dr. Hetty Ely will return to [facility] at [facility] in October of 2011 with a reduced schedule.    If you have any questions, or would like to request an appointment with one of our physicians, please call us at [phone] and press the option for Scheduling an appointment.  We take pleasure in providing you with excellent patient care and look forward to seeing you at your next office visit.  Our [facility] Physicians are:  Tillman Abide, M.D. Laurita Quint, M.D. Roxy Manns, M.D. Kerby Nora, M.D. Hannah Beat, M.D. Ruthe Mannan, M.D. We proudly welcomed Raechel Ache, M.D. and Eustaquio Boyden, M.D. to the practice in July/August 2011.  Sincerely,  [facility]

## 2010-03-08 NOTE — Assessment & Plan Note (Signed)
Summary: CONGESTION   Vital Signs:  Patient profile:   77 year old male Height:      62.5 inches Weight:      184 pounds BMI:     33.24 Temp:     97.5 degrees F oral Pulse rate:   68 / minute Pulse rhythm:   regular BP sitting:   130 / 94  (right arm) Cuff size:   large  Vitals Entered By: Linde Gillis CMA [facility]) (January 03, 2010 12:06 PM) CC: congestion   History of Present Illness: Pt here with his son. He had been seen for rectal pain and bleeding a few months ago. That is better now. He is here today, he has no headache, he has not had fever or chills, no ear pain, ears feel stopped up sometimes, having rhinitis that is occas clear, some ST, some cough that is occas productive of creamy green color, no N/V or abd pain. He has had the sxs of rhinitis since Fri, then progressed. He has taken BC powder and Nyquil. We discussed effect of decongestants. He has not had a flu shot this year.  Problems Prior to Update: 1)  Hemorrhoids, External  (ICD-455.3) 2)  Dementia  (ICD-294.8) 3)  Ear Pain, Right  (ICD-388.70) 4)  Systolic Murmur  (ICD-785.2) 5)  Pharyngitis  (ICD-462) 6)  Fatigue  (ICD-780.79) 7)  Esophageal Reflux  (ICD-530.81) 8)  Fm Hx Malignant Neoplasm Gastrointestinal Tract  (ICD-V16.0) 9)  Polyp, Colon  (ICD-211.3) 10)  Low Back Pain, Chronic  (ICD-724.2) 11)  Dizziness  (ICD-780.4) 12)  Screening For Malignannt Neoplasm, Site Nec  (ICD-V76.49) 13)  Disorder, Carbohydrate Metabolism Nos  (ICD-271.9) 14)  Hyprtrphy Prostate Bng w/o Urinary Obst/luts  (ICD-600.00) 15)  Erectile Dysfunction  (ICD-302.72) 16)  Benign Prostatic Hypertrophy, Hx of  (ICD-V13.8) 17)  Glucose Intolerance, Hx of  (ICD-V12.2) 18)  Diverticulosis, Colon Diverticula At 30 Cm On Flex  (ICD-562.10) 19)  Carotid Artery Disease  (ICD-433.10) 20)  Allergic Reaction (CASHEWS)  (ICD-995.3) 21)  Hypertension  (ICD-401.9) 22)  Hyperlipidemia With Cad, Elevated Trig  (ICD-272.4) 23)  Coronary  Artery Disease  (ICD-414.00) 24)  Anxiety  (ICD-300.00)  Medications Prior to Update: 1)  Sertraline Hcl 50 Mg Tabs (Sertraline Hcl) .... Take 1 1/2 Tablet By Mouth Once A Day 2)  Xanax 0.25 Mg Tabs (Alprazolam) .... Take One By Mouth Three Times A Day 3)  Metoprolol Tartrate 50 Mg Tabs (Metoprolol Tartrate) .... Take  1 1/2 By Mouth Two Times A Day 4)  Hydrochlorothiazide 12.5 Mg Caps (Hydrochlorothiazide) .... Take One By Mouth Daily 5)  Efudex 5 % Crea (Fluorouracil) .... Aplly Two Times A Day As Needed 6)  Simvastatin 80 Mg Tabs (Simvastatin) .... One Tab By Mouth At Night 7)  Aspirin 81 Mg Tbec (Aspirin) .... Take One By Mouth Daily 8)  Namenda 10 Mg Tabs (Memantine Hcl) .... One Tab By Mouth Two Times A Day 9)  Proctosol Hc 2.5 % Crea (Hydrocortisone) .... Apply Daily and After Each Bm  Current Medications (verified): 1)  Sertraline Hcl 50 Mg Tabs (Sertraline Hcl) .... Take 1 1/2 Tablet By Mouth Once A Day 2)  Xanax 0.25 Mg Tabs (Alprazolam) .... Take One By Mouth Three Times A Day 3)  Metoprolol Tartrate 50 Mg Tabs (Metoprolol Tartrate) .... Take  1 1/2 By Mouth Two Times A Day 4)  Hydrochlorothiazide 12.5 Mg Caps (Hydrochlorothiazide) .... Take One By Mouth Daily 5)  Efudex 5 % Crea (Fluorouracil) .... Aplly  Two Times A Day As Needed 6)  Simvastatin 80 Mg Tabs (Simvastatin) .... One Tab By Mouth At Night 7)  Aspirin 81 Mg Tbec (Aspirin) .... Take One By Mouth Daily 8)  Namenda 10 Mg Tabs (Memantine Hcl) .... One Tab By Mouth Two Times A Day 9)  Proctosol Hc 2.5 % Crea (Hydrocortisone) .... Apply Daily and After Each Bm  Allergies: 1)  ! * Aleve 2)  * Sulfa (Sulfonamides) Group 3)  Altace (Ramipril) 4)  Niaspan (Niacin (Antihyperlipidemic))  Physical Exam  General:  Well-developed,well-nourished,in no acute distress; alert,appropriate and cooperative throughout examination, congested facially with deep voice. Head:  Normocephalic and atraumatic without obvious abnormalities.  No apparent alopecia but male pattern balding. Sinuses NT tyo direct palpation..  Eyes:  Conjunctiva inflamed, palp>bulbar with significant tearing. Ears:  External ear exam shows no significant lesions or deformities.  Otoscopic examination reveals clear canals, tympanic membranes are intact bilaterally without bulging  or discharge. TMs are dull w/o erythema but have poor mobility.  Hearing is grossly normal bilaterally. I see no tympanic rupture. Nose:  External nasal examination shows no deformity or inflammation. Nasal mucosa are pink and moist without lesions or exudates. Mouth:  Oral mucosa and oropharynx without lesions or exudates.  Teeth in good repair.  Neck:  no carotid bruit or thyromegaly no cervical or supraclavicular lymphadenopathy  Lungs:  Normal respiratory effort, chest expands symmetrically. Lungs are clear to auscultation, no crackles or wheezes. Heart:  Normal rate and regular rhythm. S1 and S2 normal without gallop, click, rub. Early II-III/VI sys murmur heard throughout.   Impression & Recommendations:  Problem # 1:  URI (ICD-465.9) Assessment New  See instructions. His updated medication list for this problem includes:    Aspirin 81 Mg Tbec (Aspirin) .Marland Kitchen... Take one by mouth daily  Instructed on symptomatic treatment. Call if symptoms persist or worsen.   Problem # 2:  PHARYNGITIS (ICD-462) Assessment: Deteriorated  Recurrent, see instructions. His updated medication list for this problem includes:    Aspirin 81 Mg Tbec (Aspirin) .Marland Kitchen... Take one by mouth daily  Instructed to complete antibiotics and call if not improved in 48 hours.   Problem # 3:  HYPERTENSION (ICD-401.9) Assessment: Deteriorated Reassess next time as pt has been taking some decongestant.  His updated medication list for this problem includes:    Metoprolol Tartrate 50 Mg Tabs (Metoprolol tartrate) .Marland Kitchen... Take  1 1/2 by mouth two times a day    Hydrochlorothiazide 12.5 Mg Caps  (Hydrochlorothiazide) .Marland Kitchen... Take one by mouth daily  BP today: 130/94 Prior BP: 136/68 (10/26/2009)  Labs Reviewed: K+: 4.4 (12/09/2008) Creat: : 1.3 (12/09/2008)   Chol: 136 (12/09/2008)   HDL: 35.60 (12/09/2008)   LDL: 69 (12/09/2008)   TG: 159.0 (12/09/2008)  Complete Medication List: 1)  Sertraline Hcl 50 Mg Tabs (Sertraline hcl) .... Take 1 1/2 tablet by mouth once a day 2)  Xanax 0.25 Mg Tabs (Alprazolam) .... Take one by mouth three times a day 3)  Metoprolol Tartrate 50 Mg Tabs (Metoprolol tartrate) .... Take  1 1/2 by mouth two times a day 4)  Hydrochlorothiazide 12.5 Mg Caps (Hydrochlorothiazide) .... Take one by mouth daily 5)  Efudex 5 % Crea (Fluorouracil) .... Aplly two times a day as needed 6)  Simvastatin 80 Mg Tabs (Simvastatin) .... One tab by mouth at night 7)  Aspirin 81 Mg Tbec (Aspirin) .... Take one by mouth daily 8)  Namenda 10 Mg Tabs (Memantine hcl) .... One tab by mouth  two times a day 9)  Proctosol Hc 2.5 % Crea (Hydrocortisone) .... Apply daily and after each bm  Other Orders: Flu Vaccine 74yrs + MEDICARE PATIENTS (D6644) Administration Flu vaccine - MCR (I3474)  Patient Instructions: 1)  Take Guaifenesin by [facility] or [name] and getting MUCOUS RELIEF EXPECTORANT (400mg ), take 11/2 tabs by mouth AM and NOON. 2)  Drink lots of fluids anytime taking Guaifenesin.  3)  Take Tyl ES 2 tabs by mouth three times a day. 4)  Gargle with warm salt water every 1/2 hr for 2 days. 5)  Take Claritin (Loratadine) 10mg , Zyrtec (Cetirizine) 10mg  or Allegra (Fexofenadine) 180mg  daily for the eyes. 6)  Call if sxs worsen.  7)  Delsym at night is fine for cough. 8)  Flu shot today.   Orders Added: 1)  Est. Patient Level III [25956] 2)  Flu Vaccine 65yrs + MEDICARE PATIENTS [Q2039] 3)  Administration Flu vaccine - MCR [G0008]    Current Allergies (reviewed today): ! * ALEVE * SULFA (SULFONAMIDES) GROUP ALTACE (RAMIPRIL) NIASPAN (NIACIN  (ANTIHYPERLIPIDEMIC))   Flu Vaccine Consent Questions     Do you have a history of severe allergic reactions to this vaccine? no    Any prior history of allergic reactions to egg and/or gelatin? no    Do you have a sensitivity to the preservative Thimersol? no    Do you have a past history of Guillan-Barre Syndrome? no    Do you currently have an acute febrile illness? no    Have you ever had a severe reaction to latex? no    Vaccine information given and explained to patient? yes    Are you currently pregnant? no    Lot Number:[id number]   Exp Date:08/04/2010   Site Given  Left Deltoid [email or web address]

## 2010-03-08 NOTE — Assessment & Plan Note (Signed)
Summary: pain with bm/alc   Vital Signs:  Patient profile:   77 year old male Height:      62.5 inches Weight:      179.75 pounds BMI:     32.47 Temp:     97.6 degrees F oral Pulse rate:   80 / minute Pulse rhythm:   regular BP sitting:   136 / 68  (left arm) Cuff size:   large  Vitals Entered By: Delilah Shan CMA [facility]) (October 26, 2009 12:09 PM) CC: Pain with BM   History of Present Illness: Pain with BM.  "Burning all the time."  Started Saturday.  Sunday at [facility] and given proctosol.  No relief with this initially.  More pain Monday and Tuesday.  Blood in bed noted this AM.  Still with some burning, but pain is some better now.  No fevers.  [facility].  Colonsocopy 2009.    Allergies: 1)  ! * Aleve 2)  * Sulfa (Sulfonamides) Group 3)  Altace (Ramipril) 4)  Niaspan (Niacin (Antihyperlipidemic))  Social History: Marital Status: Married Widow 1991( wife fell in [place], hit head on rock) Children:  5 Occupation: Retired from [employer] Son lives with patient, helps out at home.   Review of Systems       See HPI.  Otherwise negative.    Physical Exam  General:  Rectal exam with ext hemorrhoid, oozing small amount of BRB.  no fissure seen.  int hemorroid not grossly bloody but noted on anoscopy.   Impression & Recommendations:  Problem # 1:  HEMORRHOIDS, EXTERNAL (ICD-455.3) d/w patient and son.  This should get better.  No reason to suspect other cause- patient was worried about this.  I reassured him.  Rec a pillow, stool softener, not reading on toilet and not straining.  use cream and follow up as needed.    Complete Medication List: 1)  Sertraline Hcl 50 Mg Tabs (Sertraline hcl) .... Take 1 1/2 tablet by mouth once a day 2)  Xanax 0.25 Mg Tabs (Alprazolam) .... Take one by mouth three times a day 3)  Metoprolol Tartrate 50 Mg Tabs (Metoprolol tartrate) .... Take  1 1/2 by mouth two times a day 4)  Hydrochlorothiazide 12.5 Mg Caps (Hydrochlorothiazide)  .... Take one by mouth daily 5)  Efudex 5 % Crea (Fluorouracil) .... Aplly two times a day as needed 6)  Simvastatin 80 Mg Tabs (Simvastatin) .... One tab by mouth at night 7)  Aspirin 81 Mg Tbec (Aspirin) .... Take one by mouth daily 8)  Namenda 10 Mg Tabs (Memantine hcl) .... One tab by mouth two times a day 9)  Proctosol Hc 2.5 % Crea (Hydrocortisone) .... Apply daily and after each bm  Patient Instructions: 1)  Take colace 100mg  by mouth once daily to keep your stools soft.  Don't strain on the toilet and don't read on the toilet.  Keep using the cream.  This should gradually get better.  Take care.   Current Allergies (reviewed today): ! * ALEVE * SULFA (SULFONAMIDES) GROUP ALTACE (RAMIPRIL) NIASPAN (NIACIN (ANTIHYPERLIPIDEMIC))

## 2010-03-08 NOTE — Progress Notes (Signed)
Summary: refill request for xanax  Phone Note Refill Request Message from:  son  Refills Requested: Medication #1:  XANAX 0.25 MG TABS Take one by mouth three times a day Phoned request from pt's son, uses [facility] [address].  Initial call taken by: Lowella Petties CMA,  September 11, 2009 4:20 PM  Follow-up for Phone Call        please call this in, same sig, #90, 3rf.  thanks.  Follow-up by: Crawford Givens MD,  September 11, 2009 9:56 PM  Additional Follow-up for Phone Call Additional follow up Details #1::        Medication phoned to pharmacy.  Additional Follow-up by: Delilah Shan CMA (AAMA),  September 12, 2009 10:15 AM    Prescriptions: XANAX 0.25 MG TABS (ALPRAZOLAM) Take one by mouth three times a day  #90 x 3   Entered by:   Delilah Shan CMA (AAMA)   Authorized by:   Crawford Givens MD   Signed by:   Delilah Shan CMA (AAMA) on 09/12/2009   Method used:   Telephoned to ...       [name] (retail)       [address]       [place], [place]  [id number]       Ph: [phone]       Fax: [phone]   RxID:   [id number]

## 2010-03-08 NOTE — Progress Notes (Signed)
Summary: Rx Sertraline  Phone Note Refill Request Call back at [phone] Message from:  [facility] on May 24, 2009 1:06 PM  Refills Requested: Medication #1:  SERTRALINE HCL 50 MG TABS Take 1 1/2 tablet by mouth once a day   Last Refilled: 04/10/2009  Method Requested: Electronic Initial call taken by: Sydell Axon LPN,  May 24, 2009 1:06 PM    Prescriptions: SERTRALINE HCL 50 MG TABS (SERTRALINE HCL) Take 1 1/2 tablet by mouth once a day  #45 Tablet x 12   Entered and Authorized by:   Shaune Leeks MD   Signed by:   Shaune Leeks MD on 05/24/2009   Method used:   Electronically to        [name] (retail)       [address]       [place], [place]  [id number]       Ph: [phone]       Fax: [phone]   RxID:   [id number]

## 2010-03-08 NOTE — Assessment & Plan Note (Signed)
Summary: EARS HURTING, NAUSEA   Vital Signs:  Patient profile:   77 year old male Weight:      182 pounds Temp:     98.6 degrees F oral Pulse rate:   68 / minute Pulse rhythm:   regular BP sitting:   144 / 74  (left arm) Cuff size:   regular  Vitals Entered By: Sydell Axon LPN (March 30, 2009 11:31 AM) CC: Right ear pain and knot under ear, nauseated for several weeks   History of Present Illness: Pt here with son for right ear pain. Hde bhas no fever, no headache except in the ear. He feels nauseated but hasn't thrown up...for a week since the ear has been hurting. Some nasal discharge no color. No ST today but has been previously about a week ago. He has no cough, some SOB, some nausea with no vomiting. Appetite off, doesn't eat a lot. He has also had diarrhea.  He had nausea for a while off and on. Stamina is deteriorating. His short term memory is worse.  Problems Prior to Update: 1)  Esophageal Reflux  (ICD-530.81) 2)  Fm Hx Malignant Neoplasm Gastrointestinal Tract  (ICD-V16.0) 3)  Polyp, Colon  (ICD-211.3) 4)  Low Back Pain, Chronic  (ICD-724.2) 5)  Dizziness  (ICD-780.4) 6)  Screening For Malignannt Neoplasm, Site Nec  (ICD-V76.49) 7)  Disorder, Carbohydrate Metabolism Nos  (ICD-271.9) 8)  Hyprtrphy Prostate Bng w/o Urinary Obst/luts  (ICD-600.00) 9)  Erectile Dysfunction  (ICD-302.72) 10)  Benign Prostatic Hypertrophy, Hx of  (ICD-V13.8) 11)  Glucose Intolerance, Hx of  (ICD-V12.2) 12)  Diverticulosis, Colon Diverticula At 30 Cm On Flex  (ICD-562.10) 13)  Carotid Artery Disease  (ICD-433.10) 14)  Allergic Reaction (CASHEWS)  (ICD-995.3) 15)  Hypertension  (ICD-401.9) 16)  Hyperlipidemia With Cad, Elevated Trig  (ICD-272.4) 17)  Coronary Artery Disease  (ICD-414.00) 18)  Anxiety  (ICD-300.00)  Medications Prior to Update: 1)  Sertraline Hcl 50 Mg Tabs (Sertraline Hcl) .... Take 1 1/2 Tablet By Mouth Once A Day 2)  Xanax 0.25 Mg Tabs (Alprazolam) .... Take One  By Mouth Three Times A Day 3)  Metoprolol Tartrate 50 Mg Tabs (Metoprolol Tartrate) .... Take  1 1/2 By Mouth Two Times A Day 4)  Hydrochlorothiazide 12.5 Mg Caps (Hydrochlorothiazide) .... Take One By Mouth Daily 5)  Viagra 50 Mg Tabs (Sildenafil Citrate) 6)  Efudex 5 % Crea (Fluorouracil) .... Aplly Two Times A Day As Needed 7)  Simvastatin 80 Mg Tabs (Simvastatin) .... One Tab By Mouth At Night 8)  Aspirin 81 Mg Tbec (Aspirin) .... Take One By Mouth Daily  Allergies: 1)  ! * Aleve 2)  * Sulfa (Sulfonamides) Group 3)  Altace (Ramipril) 4)  Niaspan (Niacin (Antihyperlipidemic))  Physical Exam  General:  appears younger than stated age, in NAD Head:  Normocephalic and atraumatic without obvious abnormalities. No apparent alopecia but male pattern balding. Eyes:  No corneal or conjunctival inflammation noted. EOMI. Perrla. Funduscopic exam benign, without hemorrhages, exudates or papilledema. Vision grossly normal. Ears:  External ear exam shows no significant lesions or deformities.  Otoscopic examination reveals clear canals, tympanic membranes are intact bilaterally without bulging, retraction, inflammation or discharge. Hearing is grossly normal bilaterally. Nose:  External nasal examination shows no deformity or inflammation. Nasal mucosa are pink and moist without lesions or exudates. Mouth:  Oral mucosa and oropharynx without lesions or exudates.  Teeth in good repair. Minimal erythema of the pharynx. Neck:  no carotid bruit or thyromegaly no  cervical or supraclavicular lymphadenopathy  Chest Wall:  No deformities, masses, tenderness or gynecomastia noted. Lungs:  Normal respiratory effort, chest expands symmetrically. Lungs are clear to auscultation, no crackles or wheezes. Heart:  Normal rate and regular rhythm. S1 and S2 normal without gallop, click, rub. Early II-III/VI sys murmur heard throughout. Cervical Nodes:  One isolated submand lymph node, shoddy and  moblie.   Impression & Recommendations:  Problem # 1:  PHARYNGITIS (ICD-462) Assessment New  With right ear discomfort and right submand mobile shoddy one isolated lymph node. Get CBC today and follow pt instructions. His updated medication list for this problem includes:    Aspirin 81 Mg Tbec (Aspirin) .Marland Kitchen... Take one by mouth daily    Augmentin 500-125 Mg Tabs (Amoxicillin-pot clavulanate) ..... One tab by mouth three times a day  Instructed to complete antibiotics and call if not improved in 48 hours.   Problem # 2:  SYSTOLIC MURMUR (ZOX-096.2) Assessment: New  Will check old chart. I do not remember hearing such a pronounced heart murmur. Will see him back in two weeks.  EKG obtained (see interpretation); Will refer to cardiology for evaluation of this murmur.   Complete Medication List: 1)  Sertraline Hcl 50 Mg Tabs (Sertraline hcl) .... Take 1 1/2 tablet by mouth once a day 2)  Xanax 0.25 Mg Tabs (Alprazolam) .... Take one by mouth three times a day 3)  Metoprolol Tartrate 50 Mg Tabs (Metoprolol tartrate) .... Take  1 1/2 by mouth two times a day 4)  Hydrochlorothiazide 12.5 Mg Caps (Hydrochlorothiazide) .... Take one by mouth daily 5)  Viagra 50 Mg Tabs (Sildenafil citrate) 6)  Efudex 5 % Crea (Fluorouracil) .... Aplly two times a day as needed 7)  Simvastatin 80 Mg Tabs (Simvastatin) .... One tab by mouth at night 8)  Aspirin 81 Mg Tbec (Aspirin) .... Take one by mouth daily 9)  Augmentin 500-125 Mg Tabs (Amoxicillin-pot clavulanate) .... One tab by mouth three times a day 10)  Promethazine Hcl 25 Mg Tabs (Promethazine hcl) .... One tab by mouth every 6 hrs as needed nausea.  Other Orders: Venipuncture (04540) TLB-CBC Platelet - w/Differential (85025-CBCD) TLB-Glucose, QUANT (82947-GLU)  Patient Instructions: 1)  RTC 2 weeks. Discuss sxs, reassess murmur and discuss memory. 2)  Take Augmentin. 3)  Take Guaifenesin by [facility] or [name] and  getting MUCOUS RELIEF EXPECTORANT (400mg ), take 11/2 tabs by mouth AM and NOON. 4)  Drink lots of fluids anytime taking Guaifenesin.  5)  Gargle with warm salt water frequently. 6)  may use Phenergan for nausea as needed. Prescriptions: PROMETHAZINE HCL 25 MG TABS (PROMETHAZINE HCL) one tab by mouth every 6 hrs as needed nausea.  #20 x 0   Entered and Authorized by:   Shaune Leeks MD   Signed by:   Shaune Leeks MD on 03/30/2009   Method used:   Electronically to        [name] (retail)       [address]       [place], [place]  [id number]       Ph: [phone]       Fax: [phone]   RxID:   [id number] AUGMENTIN 500-125 MG TABS (AMOXICILLIN-POT CLAVULANATE) one tab by mouth three times a day  #30 x 0   Entered and Authorized by:   Shaune Leeks MD   Signed by:   Shaune Leeks MD on 03/30/2009  Method used:   Electronically to        [name] [address] (retail)       [address]       [place], [place]  [id number]       Ph: [phone]       Fax: [phone]   RxID:   [id number]   Current Allergies (reviewed today): ! * ALEVE * SULFA (SULFONAMIDES) GROUP ALTACE (RAMIPRIL) NIASPAN (NIACIN (ANTIHYPERLIPIDEMIC))

## 2010-03-08 NOTE — Assessment & Plan Note (Signed)
Summary: 2 WEEK FOLLOW UP/[facility]   Vital Signs:  Patient profile:   77 year old male Weight:      173.75 pounds Temp:     98.4 degrees F oral Pulse rate:   64 / minute Pulse rhythm:   regular BP sitting:   180 / 90  (left arm) Cuff size:   large  Vitals Entered By: Sydell Axon LPN (May 17, 2009 12:13 PM) CC: 2 week follow-up, right ear pain, has an appt with ENT tomorrow, needs a refill on Xanax   History of Present Illness: Had acute pain episode last night and then his pain eased off and he really hasn't had any since. He had some discharge from the ear after the pain. He has a followup appt with ENT tomm. He wonders if he should keep the appt. He needs refill of his Xanax.  Problems Prior to Update: 1)  Memory Loss  (ICD-780.93) 2)  Ear Pain, Right  (ICD-388.70) 3)  Systolic Murmur  (ICD-785.2) 4)  Pharyngitis  (ICD-462) 5)  Fatigue  (ICD-780.79) 6)  Esophageal Reflux  (ICD-530.81) 7)  Fm Hx Malignant Neoplasm Gastrointestinal Tract  (ICD-V16.0) 8)  Polyp, Colon  (ICD-211.3) 9)  Low Back Pain, Chronic  (ICD-724.2) 10)  Dizziness  (ICD-780.4) 11)  Screening For Malignannt Neoplasm, Site Nec  (ICD-V76.49) 12)  Disorder, Carbohydrate Metabolism Nos  (ICD-271.9) 13)  Hyprtrphy Prostate Bng w/o Urinary Obst/luts  (ICD-600.00) 14)  Erectile Dysfunction  (ICD-302.72) 15)  Benign Prostatic Hypertrophy, Hx of  (ICD-V13.8) 16)  Glucose Intolerance, Hx of  (ICD-V12.2) 17)  Diverticulosis, Colon Diverticula At 30 Cm On Flex  (ICD-562.10) 18)  Carotid Artery Disease  (ICD-433.10) 19)  Allergic Reaction (CASHEWS)  (ICD-995.3) 20)  Hypertension  (ICD-401.9) 21)  Hyperlipidemia With Cad, Elevated Trig  (ICD-272.4) 22)  Coronary Artery Disease  (ICD-414.00) 23)  Anxiety  (ICD-300.00)  Medications Prior to Update: 1)  Sertraline Hcl 50 Mg Tabs (Sertraline Hcl) .... Take 1 1/2 Tablet By Mouth Once A Day 2)  Xanax 0.25 Mg Tabs (Alprazolam) .... Take One By Mouth Three Times A Day 3)   Metoprolol Tartrate 50 Mg Tabs (Metoprolol Tartrate) .... Take  1 1/2 By Mouth Two Times A Day 4)  Hydrochlorothiazide 12.5 Mg Caps (Hydrochlorothiazide) .... Take One By Mouth Daily 5)  Viagra 50 Mg Tabs (Sildenafil Citrate) 6)  Efudex 5 % Crea (Fluorouracil) .... Aplly Two Times A Day As Needed 7)  Simvastatin 80 Mg Tabs (Simvastatin) .... One Tab By Mouth At Night 8)  Aspirin 81 Mg Tbec (Aspirin) .... Take One By Mouth Daily 9)  Vicodin 5-500 Mg Tabs (Hydrocodone-Acetaminophen) .... One Tsab By Mouth Two Times A Day 10)  Prednisone 10 Mg Tabs (Prednisone) .... 6 Tabs By Mouth in Am For 6 Days, Then Decrease By 1 Pill Every Three Days Until  1/2 Pill For 6 Days.  Allergies: 1)  ! * Aleve 2)  * Sulfa (Sulfonamides) Group 3)  Altace (Ramipril) 4)  Niaspan (Niacin (Antihyperlipidemic))  Physical Exam  General:  Well-developed,well-nourished,in mild  distress from right ear pain ; alert,appropriate and cooperative throughout examination Head:  Normocephalic and atraumatic without obvious abnormalities. No apparent alopecia but male pattern balding. Sinuses NT.  Eyes:  No corneal or conjunctival inflammation noted. EOMI. PERRLA. Ears:  External ear exam shows no significant lesions or deformities.  Otoscopic examination reveals clear canals, tympanic membranes are intact bilaterally without bulging  or discharge. TMs are dull w/o erythema but have poor mobility.  Hearing is grossly normal bilaterally. I see no tympanic rupture.   Impression & Recommendations:  Problem # 1:  EAR PAIN, RIGHT (ICD-388.70) Assessment Improved Keep appt with ENT. Cont taper of Prednisone.  Problem # 2:  MEMORY LOSS (ICD-780.93) Assessment: Unchanged Will schwedule 30 min appt to assess next time.  Problem # 3:  ANXIETY (ICD-300.00) Assessment: Unchanged Stable on curr meds. Has not accelerated use of Xanax. May cont at curr dose. His updated medication list for this problem includes:    Sertraline Hcl  50 Mg Tabs (Sertraline hcl) .Marland Kitchen... Take 1 1/2 tablet by mouth once a day    Xanax 0.25 Mg Tabs (Alprazolam) .Marland Kitchen... Take one by mouth three times a day  Discussed medication use and relaxation techniques.   Problem # 4:  HYPERTENSION (ICD-401.9) Assessment: Unchanged He wants to follow. Will recheck next time and adjust as required. His updated medication list for this problem includes:    Metoprolol Tartrate 50 Mg Tabs (Metoprolol tartrate) .Marland Kitchen... Take  1 1/2 by mouth two times a day    Hydrochlorothiazide 12.5 Mg Caps (Hydrochlorothiazide) .Marland Kitchen... Take one by mouth daily  BP today: 180/90 Prior BP: 148/70 (05/04/2009)  Labs Reviewed: K+: 4.4 (12/09/2008) Creat: : 1.3 (12/09/2008)   Chol: 136 (12/09/2008)   HDL: 35.60 (12/09/2008)   LDL: 69 (12/09/2008)   TG: 159.0 (12/09/2008)  Complete Medication List: 1)  Sertraline Hcl 50 Mg Tabs (Sertraline hcl) .... Take 1 1/2 tablet by mouth once a day 2)  Xanax 0.25 Mg Tabs (Alprazolam) .... Take one by mouth three times a day 3)  Metoprolol Tartrate 50 Mg Tabs (Metoprolol tartrate) .... Take  1 1/2 by mouth two times a day 4)  Hydrochlorothiazide 12.5 Mg Caps (Hydrochlorothiazide) .... Take one by mouth daily 5)  Viagra 50 Mg Tabs (Sildenafil citrate) 6)  Efudex 5 % Crea (Fluorouracil) .... Aplly two times a day as needed 7)  Simvastatin 80 Mg Tabs (Simvastatin) .... One tab by mouth at night 8)  Aspirin 81 Mg Tbec (Aspirin) .... Take one by mouth daily 9)  Vicodin 5-500 Mg Tabs (Hydrocodone-acetaminophen) .... One tsab by mouth two times a day 10)  Prednisone 10 Mg Tabs (Prednisone) .... 6 tabs by mouth in am for 6 days, then decrease by 1 pill every three days until  1/2 pill for 6 days.  Patient Instructions: 1)  RTC for 30 min appt sometime for dementia evaluation. Prescriptions: XANAX 0.25 MG TABS (ALPRAZOLAM) Take one by mouth three times a day  #90 x 3   Entered and Authorized by:   Shaune Leeks MD   Signed by:   Shaune Leeks MD on 05/17/2009   Method used:   Print then Give to Patient   RxID:   [id number]   Current Allergies (reviewed today): ! * ALEVE * SULFA (SULFONAMIDES) GROUP ALTACE (RAMIPRIL) NIASPAN (NIACIN (ANTIHYPERLIPIDEMIC))

## 2010-03-08 NOTE — Assessment & Plan Note (Signed)
Summary: 2 week follow up/[facility]   Vital Signs:  Patient profile:   77 year old male Weight:      182.25 pounds Temp:     98.3 degrees F oral Pulse rate:   68 / minute Pulse rhythm:   regular BP sitting:   140 / 70  (left arm) Cuff size:   large  Vitals Entered By: Sydell Axon LPN (April 13, 2009 11:23 AM) CC: 2 Week follow-up, still nauseated and right ear pain   History of Present Illness: Pt here for recheck of ear pain which after exam last time I thought was probably ETD and treated as such but also covered him with Augmentin for nonspecific Lymphadenopathy.. His CBC was nml as well and his TM was dull but noninfected looking. He continues with ear pain, into the jaw at times and the nausea he had continues, unaffected by promethazine.  He has not really had fever and has had no rash. He has had right Carotid Surgery and he feels a small nodule at the  top of his scar. His son is also still concerned about memory loss.   Problems Prior to Update: 1)  Systolic Murmur  (ICD-785.2) 2)  Pharyngitis  (ICD-462) 3)  Fatigue  (ICD-780.79) 4)  Esophageal Reflux  (ICD-530.81) 5)  Fm Hx Malignant Neoplasm Gastrointestinal Tract  (ICD-V16.0) 6)  Polyp, Colon  (ICD-211.3) 7)  Low Back Pain, Chronic  (ICD-724.2) 8)  Dizziness  (ICD-780.4) 9)  Screening For Malignannt Neoplasm, Site Nec  (ICD-V76.49) 10)  Disorder, Carbohydrate Metabolism Nos  (ICD-271.9) 11)  Hyprtrphy Prostate Bng w/o Urinary Obst/luts  (ICD-600.00) 12)  Erectile Dysfunction  (ICD-302.72) 13)  Benign Prostatic Hypertrophy, Hx of  (ICD-V13.8) 14)  Glucose Intolerance, Hx of  (ICD-V12.2) 15)  Diverticulosis, Colon Diverticula At 30 Cm On Flex  (ICD-562.10) 16)  Carotid Artery Disease  (ICD-433.10) 17)  Allergic Reaction (CASHEWS)  (ICD-995.3) 18)  Hypertension  (ICD-401.9) 19)  Hyperlipidemia With Cad, Elevated Trig  (ICD-272.4) 20)  Coronary Artery Disease  (ICD-414.00) 21)  Anxiety  (ICD-300.00)  Medications Prior  to Update: 1)  Sertraline Hcl 50 Mg Tabs (Sertraline Hcl) .... Take 1 1/2 Tablet By Mouth Once A Day 2)  Xanax 0.25 Mg Tabs (Alprazolam) .... Take One By Mouth Three Times A Day 3)  Metoprolol Tartrate 50 Mg Tabs (Metoprolol Tartrate) .... Take  1 1/2 By Mouth Two Times A Day 4)  Hydrochlorothiazide 12.5 Mg Caps (Hydrochlorothiazide) .... Take One By Mouth Daily 5)  Viagra 50 Mg Tabs (Sildenafil Citrate) 6)  Efudex 5 % Crea (Fluorouracil) .... Aplly Two Times A Day As Needed 7)  Simvastatin 80 Mg Tabs (Simvastatin) .... One Tab By Mouth At Night 8)  Aspirin 81 Mg Tbec (Aspirin) .... Take One By Mouth Daily 9)  Augmentin 500-125 Mg Tabs (Amoxicillin-Pot Clavulanate) .... One Tab By Mouth Three Times A Day 10)  Promethazine Hcl 25 Mg Tabs (Promethazine Hcl) .... One Tab By Mouth Every 6 Hrs As Needed Nausea.  Allergies: 1)  ! * Aleve 2)  * Sulfa (Sulfonamides) Group 3)  Altace (Ramipril) 4)  Niaspan (Niacin (Antihyperlipidemic))  Physical Exam  General:  Well-developed,well-nourished,in mild  distress from right ear pain ; alert,appropriate and cooperative throughout examination Head:  Normocephalic and atraumatic without obvious abnormalities. No apparent alopecia but male pattern balding. Eyes:  No corneal or conjunctival inflammation noted. EOMI. PERRLA. Ears:  External ear exam shows no significant lesions or deformities.  Otoscopic examination reveals clear canals, tympanic membranes  are intact bilaterally without bulging  or discharge. TMs are dull w/o erythema but have poor mobility.  Hearing is grossly normal bilaterally. Nose:  External nasal examination shows no deformity or inflammation. Nasal mucosa are pink and moist without lesions or exudates. Mouth:  Oral mucosa and oropharynx without lesions or exudates.  Teeth in good repair. Minimal erythema of the pharynx. Neck:  no carotid bruit or thyromegaly no cervical or supraclavicular lymphadenopathy    Impression &  Recommendations:  Problem # 1:  EAR PAIN, RIGHT (ICD-388.70) Assessment Unchanged  Pt says no impr....if anything, bothers him more and I think his nausea is from the pain. Will refer to ENT for eval beefore assuming Shingles without rash.  Trial of Vicodin for pain. He has finished the AUgmentin. The following medications were removed from the medication list:    Augmentin 500-125 Mg Tabs (Amoxicillin-pot clavulanate) ..... One tab by mouth three times a day  Orders: ENT Referral (ENT)  Discussed symptom control.   Problem # 2:  SYSTOLIC MURMUR (ZOX-096.2) Assessment: Unchanged  Will recheck next time.  Problem # 3:  MEMORY LOSS (ICD-780.93) Assessment: New Will eval next time if able.  Complete Medication List: 1)  Sertraline Hcl 50 Mg Tabs (Sertraline hcl) .... Take 1 1/2 tablet by mouth once a day 2)  Xanax 0.25 Mg Tabs (Alprazolam) .... Take one by mouth three times a day 3)  Metoprolol Tartrate 50 Mg Tabs (Metoprolol tartrate) .... Take  1 1/2 by mouth two times a day 4)  Hydrochlorothiazide 12.5 Mg Caps (Hydrochlorothiazide) .... Take one by mouth daily 5)  Viagra 50 Mg Tabs (Sildenafil citrate) 6)  Efudex 5 % Crea (Fluorouracil) .... Aplly two times a day as needed 7)  Simvastatin 80 Mg Tabs (Simvastatin) .... One tab by mouth at night 8)  Aspirin 81 Mg Tbec (Aspirin) .... Take one by mouth daily 9)  Vicodin 5-500 Mg Tabs (Hydrocodone-acetaminophen) .... One tsab by mouth two times a day  Patient Instructions: 1)  Refer to ENT. 2)  See me after ENT appt. 3)  Reassess murmur next time and discuss memory. Prescriptions: VICODIN 5-500 MG TABS (HYDROCODONE-ACETAMINOPHEN) one tsab by mouth two times a day  #60 x 0   Entered and Authorized by:   Shaune Leeks MD   Signed by:   Shaune Leeks MD on 04/13/2009   Method used:   Printed then faxed to ...       [name] (retail)       [address]       [place], [place]   [id number]       Ph: [phone]       Fax: [phone]   RxID:   [id number]   Current Allergies (reviewed today): ! * ALEVE * SULFA (SULFONAMIDES) GROUP ALTACE (RAMIPRIL) NIASPAN (NIACIN (ANTIHYPERLIPIDEMIC))

## 2010-03-08 NOTE — Progress Notes (Signed)
Summary: update on condition  Phone Note Call from Patient   Caller: Charleen Kirks  [phone], [phone] Summary of Call: Pt saw the ENT last thursday, he said pt's jaw bone was jumping out of joint and he thought that was what was causing pt's earache.  He prescribed 1800 mg's of ibuprofen daily, which messed up pt's stomach.  Son didnt get the vicodin script filled but is gong to.  He will take that as well as one half of a 600 mg ibuprofen. Initial call taken by: Lowella Petties CMA,  April 17, 2009 2:18 PM  Follow-up for Phone Call        Noted.  Have him return in 21/2 weeks pls. Follow-up by: Shaune Leeks MD,  April 17, 2009 5:04 PM  Additional Follow-up for Phone Call Additional follow up Details #1::        Patient's son notified. Appt scheduled with Dr. Hetty Ely on 05/04/09 at 12 noon. Additional Follow-up by: Sydell Axon LPN,  April 17, 2009 5:22 PM

## 2010-03-08 NOTE — Assessment & Plan Note (Signed)
Summary: 2 WK F/U/CLE   Vital Signs:  Patient profile:   77 year old male Weight:      182.75 pounds Temp:     98.2 degrees F oral Pulse rate:   72 / minute Pulse rhythm:   regular BP sitting:   118 / 70  (left arm) Cuff size:   large  Vitals Entered By: Sydell Axon LPN (July 26, 2009 11:44 AM) CC: 2 week follow-up   History of Present Illness: Pt here with his son for 2 week followup of starting Namenda. He has had no problems. He has refills. He has tolerated the medication well. He feels fine even after falling from a chair thia morning trying to adjust a bird feeder "busting my butt." He did not hit his head and had no LOC. He will be sore tommorrow. He feels well today and has no complaints.  Problems Prior to Update: 1)  Dementia  (ICD-294.8) 2)  Ear Pain, Right  (ICD-388.70) 3)  Systolic Murmur  (ICD-785.2) 4)  Pharyngitis  (ICD-462) 5)  Fatigue  (ICD-780.79) 6)  Esophageal Reflux  (ICD-530.81) 7)  Fm Hx Malignant Neoplasm Gastrointestinal Tract  (ICD-V16.0) 8)  Polyp, Colon  (ICD-211.3) 9)  Low Back Pain, Chronic  (ICD-724.2) 10)  Dizziness  (ICD-780.4) 11)  Screening For Malignannt Neoplasm, Site Nec  (ICD-V76.49) 12)  Disorder, Carbohydrate Metabolism Nos  (ICD-271.9) 13)  Hyprtrphy Prostate Bng w/o Urinary Obst/luts  (ICD-600.00) 14)  Erectile Dysfunction  (ICD-302.72) 15)  Benign Prostatic Hypertrophy, Hx of  (ICD-V13.8) 16)  Glucose Intolerance, Hx of  (ICD-V12.2) 17)  Diverticulosis, Colon Diverticula At 30 Cm On Flex  (ICD-562.10) 18)  Carotid Artery Disease  (ICD-433.10) 19)  Allergic Reaction (CASHEWS)  (ICD-995.3) 20)  Hypertension  (ICD-401.9) 21)  Hyperlipidemia With Cad, Elevated Trig  (ICD-272.4) 22)  Coronary Artery Disease  (ICD-414.00) 23)  Anxiety  (ICD-300.00)  Medications Prior to Update: 1)  Sertraline Hcl 50 Mg Tabs (Sertraline Hcl) .... Take 1 1/2 Tablet By Mouth Once A Day 2)  Xanax 0.25 Mg Tabs (Alprazolam) .... Take One By Mouth Three  Times A Day 3)  Metoprolol Tartrate 50 Mg Tabs (Metoprolol Tartrate) .... Take  1 1/2 By Mouth Two Times A Day 4)  Hydrochlorothiazide 12.5 Mg Caps (Hydrochlorothiazide) .... Take One By Mouth Daily 5)  Viagra 50 Mg Tabs (Sildenafil Citrate) 6)  Efudex 5 % Crea (Fluorouracil) .... Aplly Two Times A Day As Needed 7)  Simvastatin 80 Mg Tabs (Simvastatin) .... One Tab By Mouth At Night 8)  Aspirin 81 Mg Tbec (Aspirin) .... Take One By Mouth Daily 9)  Vicodin 5-500 Mg Tabs (Hydrocodone-Acetaminophen) .... One Tsab By Mouth Two Times A Day 10)  Namenda 10 Mg Tabs (Memantine Hcl) .... One Tab By Mouth Two Times A Day  Allergies: 1)  ! * Aleve 2)  * Sulfa (Sulfonamides) Group 3)  Altace (Ramipril) 4)  Niaspan (Niacin (Antihyperlipidemic))  Physical Exam  General:  Well-developed,well-nourished,; alert,appropriate and cooperative throughout examination Head:  Normocephalic and atraumatic without obvious abnormalities. No apparent alopecia but male pattern balding. Sinuses NT.  Eyes:  No corneal or conjunctival inflammation noted. EOMI. PERRLA. Ears:  External ear exam shows no significant lesions or deformities.  Otoscopic examination reveals clear canals, tympanic membranes are intact bilaterally without bulging  or discharge. TMs are dull w/o erythema but have poor mobility.  Hearing is grossly normal bilaterally. I see no tympanic rupture. Nose:  External nasal examination shows no deformity or inflammation. Nasal  mucosa are pink and moist without lesions or exudates. Mouth:  Oral mucosa and oropharynx without lesions or exudates.  Teeth in good repair.  Neck:  no carotid bruit or thyromegaly no cervical or supraclavicular lymphadenopathy  Chest Wall:  No deformities, masses, tenderness or gynecomastia noted. Lungs:  Normal respiratory effort, chest expands symmetrically. Lungs are clear to auscultation, no crackles or wheezes. Heart:  Normal rate and regular rhythm. S1 and S2 normal without  gallop, click, rub. Early II-III/VI sys murmur heard throughout. Abdomen:  Bowel sounds positive,abdomen soft and non-tender without masses, organomegaly or hernias noted. Psych:  Cognition and judgment appear intact. Alert and cooperative with normal attention span and concentration. No apparent delusions, illusions, hallucinations, good eye contact and personal interaction.   Impression & Recommendations:  Problem # 1:  DEMENTIA (ICD-294.8) Assessment Unchanged Stable, tolerating Namenda withoput difficulty. Continue.  Problem # 2:  HYPERTENSION (ICD-401.9) Assessment: Improved Better today. Will follow. His updated medication list for this problem includes:    Metoprolol Tartrate 50 Mg Tabs (Metoprolol tartrate) .Marland Kitchen... Take  1 1/2 by mouth two times a day    Hydrochlorothiazide 12.5 Mg Caps (Hydrochlorothiazide) .Marland Kitchen... Take one by mouth daily  BP today: 118/70 Prior BP: 148/74 (07/04/2009)  Labs Reviewed: K+: 4.4 (12/09/2008) Creat: : 1.3 (12/09/2008)   Chol: 136 (12/09/2008)   HDL: 35.60 (12/09/2008)   LDL: 69 (12/09/2008)   TG: 159.0 (12/09/2008)  Complete Medication List: 1)  Sertraline Hcl 50 Mg Tabs (Sertraline hcl) .... Take 1 1/2 tablet by mouth once a day 2)  Xanax 0.25 Mg Tabs (Alprazolam) .... Take one by mouth three times a day 3)  Metoprolol Tartrate 50 Mg Tabs (Metoprolol tartrate) .... Take  1 1/2 by mouth two times a day 4)  Hydrochlorothiazide 12.5 Mg Caps (Hydrochlorothiazide) .... Take one by mouth daily 5)  Viagra 50 Mg Tabs (Sildenafil citrate) 6)  Efudex 5 % Crea (Fluorouracil) .... Aplly two times a day as needed 7)  Simvastatin 80 Mg Tabs (Simvastatin) .... One tab by mouth at night 8)  Aspirin 81 Mg Tbec (Aspirin) .... Take one by mouth daily 9)  Vicodin 5-500 Mg Tabs (Hydrocodone-acetaminophen) .... One tsab by mouth two times a day 10)  Namenda 10 Mg Tabs (Memantine hcl) .... One tab by mouth two times a day  Patient Instructions: 1)  RTC 6  mos.  Current Allergies (reviewed today): ! * ALEVE * SULFA (SULFONAMIDES) GROUP ALTACE (RAMIPRIL) NIASPAN (NIACIN (ANTIHYPERLIPIDEMIC))

## 2010-03-08 NOTE — Consult Note (Signed)
Summary: Dr.Dwight Meriam [facility],Note  Dr.Dwight Bates,[facility],Note   Imported By: Beau Fanny 04/13/2009 16:40:39  _____________________________________________________________________  External Attachment:    Type:   Image     Comment:   External Document

## 2010-03-08 NOTE — Progress Notes (Signed)
Summary: refill request for alprazolam  Phone Note Refill Request Message from:  Fax from Pharmacy  Refills Requested: Medication #1:  XANAX 0.25 MG TABS Take one by mouth three times a day   Last Refilled: 04/14/2009 Faxed request from [facility], phone [phone].  Initial call taken by: Lowella Petties CMA,  May 16, 2009 3:36 PM  Follow-up for Phone Call        Called to [facility]. Follow-up by: Lowella Petties CMA,  May 16, 2009 4:47 PM    Prescriptions: Prudy Feeler 0.25 MG TABS (ALPRAZOLAM) Take one by mouth three times a day  #90 x 3   Entered and Authorized by:   Shaune Leeks MD   Signed by:   Shaune Leeks MD on 05/16/2009   Method used:   Telephoned to ...       [name] (retail)       [address]       [place], [place]  [id number]       Ph: [phone]       Fax: [phone]   RxID:   [id number]

## 2010-03-08 NOTE — Assessment & Plan Note (Signed)
Summary: ROA FOR DEMENTIA/JRR   Vital Signs:  Patient profile:   77 year old male Weight:      180.50 pounds Temp:     98.0 degrees F oral Pulse rate:   68 / minute Pulse rhythm:   regular BP sitting:   124 / 70  (left arm) Cuff size:   large  Vitals Entered By: Sydell Axon LPN (February 21, 2010 11:47 AM) CC: Follow-up on dementia and nauseated   History of Present Illness: Pt here with one son and daughter for followup of early brochitis or pneumonia. He was seen by Dr Reece Agar one week ago. He is finished with his Zithromax and clinically sounds to be doing better. He is not coughing much and hasn't had fever or chills.  However, his son and daughter relate him having decreased clinically in the last few weeks with memory, now forgetting to eat. He is still independent of tioletting functions but all five of his children are concerned for an acute change recently...not in mobility, interaction terms but in terms of memory functions. Handling money has become a concern as well.  The son has been holding off getting help from [facility], trying to wait until further stabilty occurs. He has had some minor GI upset recently as well. The only change to medications recently is taking the zithrmax for five days and decreasing Xanax from three times a day to two times a day, which has helped the pt be more aware and responsive. He has been on Xanax a long time along with Zoloft for anxiety, a consistently significant complaint of this pt for many years.   Problems Prior to Update: 1)  Acute Bronchitis  (ICD-466.0) 2)  Hemorrhoids, External  (ICD-455.3) 3)  Dementia  (ICD-294.8) 4)  Ear Pain, Right  (ICD-388.70) 5)  Systolic Murmur  (ICD-785.2) 6)  Fatigue  (ICD-780.79) 7)  Esophageal Reflux  (ICD-530.81) 8)  Fm Hx Malignant Neoplasm Gastrointestinal Tract  (ICD-V16.0) 9)  Polyp, Colon  (ICD-211.3) 10)  Low Back Pain, Chronic  (ICD-724.2) 11)  Dizziness  (ICD-780.4) 12)  Screening For  Malignannt Neoplasm, Site Nec  (ICD-V76.49) 13)  Disorder, Carbohydrate Metabolism Nos  (ICD-271.9) 14)  Hyprtrphy Prostate Bng w/o Urinary Obst/luts  (ICD-600.00) 15)  Erectile Dysfunction  (ICD-302.72) 16)  Benign Prostatic Hypertrophy, Hx of  (ICD-V13.8) 17)  Glucose Intolerance, Hx of  (ICD-V12.2) 18)  Diverticulosis, Colon Diverticula At 30 Cm On Flex  (ICD-562.10) 19)  Carotid Artery Disease  (ICD-433.10) 20)  Allergic Reaction (CASHEWS)  (ICD-995.3) 21)  Hypertension  (ICD-401.9) 22)  Hyperlipidemia With Cad, Elevated Trig  (ICD-272.4) 23)  Coronary Artery Disease  (ICD-414.00) 24)  Anxiety  (ICD-300.00)  Medications Prior to Update: 1)  Sertraline Hcl 50 Mg Tabs (Sertraline Hcl) .... Take 1 1/2 Tablet By Mouth Once A Day 2)  Xanax 0.25 Mg Tabs (Alprazolam) .... Take One By Mouth Three Times A Day 3)  Metoprolol Tartrate 50 Mg Tabs (Metoprolol Tartrate) .... Take  1 1/2 By Mouth Two Times A Day 4)  Hydrochlorothiazide 12.5 Mg Caps (Hydrochlorothiazide) .... Take One By Mouth Daily 5)  Efudex 5 % Crea (Fluorouracil) .... Aplly Two Times A Day As Needed 6)  Simvastatin 80 Mg Tabs (Simvastatin) .... One Tab By Mouth At Night 7)  Aspirin 81 Mg Tbec (Aspirin) .... Take One By Mouth Daily 8)  Namenda 10 Mg Tabs (Memantine Hcl) .... One Tab By Mouth Two Times A Day 9)  Proctosol Hc 2.5 % Crea (Hydrocortisone) .Marland KitchenMarland KitchenMarland Kitchen  Apply Daily and After Each Bm As Needed 10)  Mucinex 600 Mg Xr12h-Tab (Guaifenesin) .... Otc As Directed. 11)  Delsym 30 Mg/54ml Lqcr (Dextromethorphan Polistirex) .... Otc As Directed. 12)  Ventolin Hfa 108 (90 Base) Mcg/act Aers (Albuterol Sulfate) .... 2 Puffs Q6 Hours As Needed Cough 13)  Zithromax Z-Pak 250 Mg Tabs (Azithromycin) .... Take As Directed  Allergies: 1)  ! * Aleve 2)  * Sulfa (Sulfonamides) Group 3)  Altace (Ramipril) 4)  Niaspan (Niacin (Antihyperlipidemic))  Physical Exam  General:  Well-developed,well-nourished,in no acute distress; alert,appropriate  and cooperative throughout examination, normal voice.. Head:  Normocephalic and atraumatic without obvious abnormalities. No apparent alopecia but male pattern balding. Sinuses NT. Eyes:  Conjunctiva clear bilaterally. PERRLA, EOMI Ears:  TMs clear bilaterally, mild cerumen Nose:  nares clear bilaterally Mouth:  MMM, no pharyngeal erythema/edema Neck:  no carotid bruit or thyromegaly no cervical or supraclavicular lymphadenopathy  Lungs:  Lungs are clear to auscultation, no crackles or wheezes. Good air exchange. Heart:  Normal rate and regular rhythm. S1 and S2 normal without gallop, click, rub. Neurologic:  No cranial nerve deficits noted. Station and gait are slow. Sensory, motor and coordinative functions appear intact. No clear reproducible nystagmus, but dizzier with lying down and sitting up. Finger-to-nose normal, heel-to-shin normal, and Romberg negative, but does sway some.   Psych:  Cognition and judgment appear intact, altho memory decreased. No formal assessment done today.  Alert and cooperative with normal attention span and concentration. No apparent delusions, illusions, hallucinations, good eye contact and personal interaction. Smiling and joking about amount of money in his wallet.   Impression & Recommendations:  Problem # 1:  DEMENTIA (ICD-294.8) Assessment Deteriorated  Pt now not remembering to eat (an acute change), he is still able to go to the bathroom. He gets lost driving and can't find his way back (a progressively worsening problem).  He forgets to take medications (but this has been seen for some time.). Financial problems are also a problem (this is fairly new and a concern for being taken advantage of). Will get MRI to insure no structural problem which could cause an acute acceleration of his sxs.  Orders: Radiology Referral (Radiology)  Problem # 2:  ACUTE BRONCHITIS (ICD-466.0) Assessment: Improved Lung function appears back to normal. Cautioned about  recurrence at the 10-14 day mark. Call if seen. The following medications were removed from the medication list:    Mucinex 600 Mg Xr12h-tab (Guaifenesin) ..... Otc as directed.    Delsym 30 Mg/85ml Lqcr (Dextromethorphan polistirex) ..... Otc as directed.    Zithromax Z-pak 250 Mg Tabs (Azithromycin) .Marland Kitchen... Take as directed His updated medication list for this problem includes:    Ventolin Hfa 108 (90 Base) Mcg/act Aers (Albuterol sulfate) .Marland Kitchen... 2 puffs q6 hours as needed cough  Complete Medication List: 1)  Sertraline Hcl 50 Mg Tabs (Sertraline hcl) .... Take 1 1/2 tablet by mouth once a day 2)  Xanax 0.25 Mg Tabs (Alprazolam) .... Take one by mouth two times a day 3)  Metoprolol Tartrate 50 Mg Tabs (Metoprolol tartrate) .... Take  1 1/2 by mouth two times a day 4)  Hydrochlorothiazide 12.5 Mg Caps (Hydrochlorothiazide) .... Take one by mouth daily 5)  Efudex 5 % Crea (Fluorouracil) .... Aplly two times a day as needed 6)  Simvastatin 80 Mg Tabs (Simvastatin) .... One tab by mouth at night 7)  Aspirin 81 Mg Tbec (Aspirin) .... Take one by mouth daily 8)  Namenda 10 Mg Tabs (  Memantine hcl) .... One tab by mouth two times a day 9)  Proctosol Hc 2.5 % Crea (Hydrocortisone) .... Apply daily and after each bm as needed 10)  Ventolin Hfa 108 (90 Base) Mcg/act Aers (Albuterol sulfate) .... 2 puffs q6 hours as needed cough  Patient Instructions: 1)  Refer for head MRI for recent accelerated dementia.  2)  Suggest getting back in touch with [facility] for organization support.   Orders Added: 1)  Radiology Referral [Radiology] 2)  Est. Patient Level III [91478]    Current Allergies (reviewed today): ! * ALEVE * SULFA (SULFONAMIDES) GROUP ALTACE (RAMIPRIL) NIASPAN (NIACIN (ANTIHYPERLIPIDEMIC))

## 2010-03-08 NOTE — Miscellaneous (Signed)
Summary: Health Care Power of [facility] Power of Attorney   Imported By: Beau Fanny 03/30/2009 13:45:46  _____________________________________________________________________  External Attachment:    Type:   Image     Comment:   External Document

## 2010-03-08 NOTE — Assessment & Plan Note (Signed)
Summary: 30 min appt dementia evaluationr/bh   Vital Signs:  Patient profile:   77 year old male Weight:      174.50 pounds Temp:     97.7 degrees F oral Pulse rate:   72 / minute Pulse rhythm:   regular BP sitting:   150 / 74  (left arm) Cuff size:   large  Vitals Entered By: Sydell Axon LPN (May 24, 2009 11:38 AM) CC: 30 Minute appointment to check for dementia   History of Present Illness: Pt here with son for finally being able to evaluate pt for memory loss. He himself has been aware of problems for a year or more, his son has gotten more concerned recently as his memory is getting progressively worse. He still is able to drive safely but gets lost easily. He readily admits this but sees no danger in the problem.  His last visit here was for ear pain that spontaneously resolved with fluid from the ear. I could see no pathology but was concerned for TM rupture I could not visualize and had the pt seen by ENT. This produced a nml exam and the pt has had no further problems.  Problems Prior to Update: 1)  Memory Loss  (ICD-780.93) 2)  Ear Pain, Right  (ICD-388.70) 3)  Systolic Murmur  (ICD-785.2) 4)  Pharyngitis  (ICD-462) 5)  Fatigue  (ICD-780.79) 6)  Esophageal Reflux  (ICD-530.81) 7)  Fm Hx Malignant Neoplasm Gastrointestinal Tract  (ICD-V16.0) 8)  Polyp, Colon  (ICD-211.3) 9)  Low Back Pain, Chronic  (ICD-724.2) 10)  Dizziness  (ICD-780.4) 11)  Screening For Malignannt Neoplasm, Site Nec  (ICD-V76.49) 12)  Disorder, Carbohydrate Metabolism Nos  (ICD-271.9) 13)  Hyprtrphy Prostate Bng w/o Urinary Obst/luts  (ICD-600.00) 14)  Erectile Dysfunction  (ICD-302.72) 15)  Benign Prostatic Hypertrophy, Hx of  (ICD-V13.8) 16)  Glucose Intolerance, Hx of  (ICD-V12.2) 17)  Diverticulosis, Colon Diverticula At 30 Cm On Flex  (ICD-562.10) 18)  Carotid Artery Disease  (ICD-433.10) 19)  Allergic Reaction (CASHEWS)  (ICD-995.3) 20)  Hypertension  (ICD-401.9) 21)  Hyperlipidemia With  Cad, Elevated Trig  (ICD-272.4) 22)  Coronary Artery Disease  (ICD-414.00) 23)  Anxiety  (ICD-300.00)  Medications Prior to Update: 1)  Sertraline Hcl 50 Mg Tabs (Sertraline Hcl) .... Take 1 1/2 Tablet By Mouth Once A Day 2)  Xanax 0.25 Mg Tabs (Alprazolam) .... Take One By Mouth Three Times A Day 3)  Metoprolol Tartrate 50 Mg Tabs (Metoprolol Tartrate) .... Take  1 1/2 By Mouth Two Times A Day 4)  Hydrochlorothiazide 12.5 Mg Caps (Hydrochlorothiazide) .... Take One By Mouth Daily 5)  Viagra 50 Mg Tabs (Sildenafil Citrate) 6)  Efudex 5 % Crea (Fluorouracil) .... Aplly Two Times A Day As Needed 7)  Simvastatin 80 Mg Tabs (Simvastatin) .... One Tab By Mouth At Night 8)  Aspirin 81 Mg Tbec (Aspirin) .... Take One By Mouth Daily 9)  Vicodin 5-500 Mg Tabs (Hydrocodone-Acetaminophen) .... One Tsab By Mouth Two Times A Day 10)  Prednisone 10 Mg Tabs (Prednisone) .... 6 Tabs By Mouth in Am For 6 Days, Then Decrease By 1 Pill Every Three Days Until  1/2 Pill For 6 Days.  Allergies: 1)  ! * Aleve 2)  * Sulfa (Sulfonamides) Group 3)  Altace (Ramipril) 4)  Niaspan (Niacin (Antihyperlipidemic))  Physical Exam  General:  Well-developed,well-nourished,in mild  distress from right ear pain ; alert,appropriate and cooperative throughout examination Head:  Normocephalic and atraumatic without obvious abnormalities. No apparent alopecia  but male pattern balding. Sinuses NT.  Eyes:  No corneal or conjunctival inflammation noted. EOMI. PERRLA. Ears:  External ear exam shows no significant lesions or deformities.  Otoscopic examination reveals clear canals, tympanic membranes are intact bilaterally without bulging  or discharge. TMs are dull w/o erythema but have poor mobility.  Hearing is grossly normal bilaterally. I see no tympanic rupture. Nose:  External nasal examination shows no deformity or inflammation. Nasal mucosa are pink and moist without lesions or exudates. Mouth:  Oral mucosa and oropharynx  without lesions or exudates.  Teeth in good repair.  Neck:  no carotid bruit or thyromegaly no cervical or supraclavicular lymphadenopathy  Chest Wall:  No deformities, masses, tenderness or gynecomastia noted. Breasts:  No masses or gynecomastia noted Lungs:  Normal respiratory effort, chest expands symmetrically. Lungs are clear to auscultation, no crackles or wheezes. Heart:  Normal rate and regular rhythm. S1 and S2 normal without gallop, click, rub. Early II-III/VI sys murmur heard throughout. Neurologic:  No cranial nerve deficits noted. Station and gait are slow. Sensory, motor and coordinative functions appear intact. No clear reproducible nystagmus, but dizzier with lying down and sitting up. Finger-to-nose normal, heel-to-shin normal, and Romberg negative, but does sway some.   Skin:  Intact without suspicious lesions or rashes, signif Aks and benign moles with aging skin changes as well. Cervical Nodes:  No lymphadenopathy noted Psych:  Cognition and judgment appear intact. Alert and cooperative with normal attention span and concentration. No apparent delusions, illusions, hallucinations, good eye contact and personal interaction.   Impression & Recommendations:  Problem # 1:  DEMENTIA (ICD-294.8) Assessment New Multiple screening tools used to assess. AD8 Interview 7/8  by pt and son. Mini Cog positive  for three item recall (2/3 and 0/3), animalgram (10 in 1 min), word naming (6 in 1 min)  and clock drawing (2/4). Mini Mental Status Exam positive 20/30 using WORLD, 17/20 using serial sevens. Extended Mini Mentral Status positive 32/50...64/100 All significantly positive for dementia which apppears to be Alzheimer's Disease. Will start pt on Aricept today with goal of increasing in one month and adding Namenda to preserve as much function as possible. All aspects discussed with pt and son.  Complete Medication List: 1)  Sertraline Hcl 50 Mg Tabs (Sertraline hcl) .... Take 1 1/2  tablet by mouth once a day 2)  Xanax 0.25 Mg Tabs (Alprazolam) .... Take one by mouth three times a day 3)  Metoprolol Tartrate 50 Mg Tabs (Metoprolol tartrate) .... Take  1 1/2 by mouth two times a day 4)  Hydrochlorothiazide 12.5 Mg Caps (Hydrochlorothiazide) .... Take one by mouth daily 5)  Viagra 50 Mg Tabs (Sildenafil citrate) 6)  Efudex 5 % Crea (Fluorouracil) .... Aplly two times a day as needed 7)  Simvastatin 80 Mg Tabs (Simvastatin) .... One tab by mouth at night 8)  Aspirin 81 Mg Tbec (Aspirin) .... Take one by mouth daily 9)  Vicodin 5-500 Mg Tabs (Hydrocodone-acetaminophen) .... One tsab by mouth two times a day 10)  Prednisone 10 Mg Tabs (Prednisone) .... 6 tabs by mouth in am for 6 days, then decrease by 1 pill every three days until  1/2 pill for 6 days. 11)  Aricept 5 Mg Tabs (Donepezil hcl) .... One tab by mouth once daily  Patient Instructions: 1)  RTC 1 month 2)  45 mins spent with pt and son. Prescriptions: ARICEPT 5 MG TABS (DONEPEZIL HCL) one tab by mouth once daily  #30 x 1  Entered and Authorized by:   Shaune Leeks MD   Signed by:   Shaune Leeks MD on 05/24/2009   Method used:   Electronically to        [name] (retail)       [address]       [place], [place]  [id number]       Ph: [phone]       Fax: [phone]   RxID:   [id number]   Current Allergies (reviewed today): ! * ALEVE * SULFA (SULFONAMIDES) GROUP ALTACE (RAMIPRIL) NIASPAN (NIACIN (ANTIHYPERLIPIDEMIC))

## 2010-03-08 NOTE — Miscellaneous (Signed)
Summary: Living Will  Living Will   Imported By: Beau Fanny 05/04/2009 13:07:49  _____________________________________________________________________  External Attachment:    Type:   Image     Comment:   External Document

## 2010-03-08 NOTE — Progress Notes (Signed)
SummaryPrudy Alvarado  Phone Note Refill Request Message from:  [facility] [phone] on February 07, 2009 5:09 PM  Refills Requested: Medication #1:  XANAX 0.25 MG TABS Take one by mouth three times a day   Last Refilled: 01/10/2009  Method Requested: Telephone to Pharmacy Initial call taken by: Mervin Hack CMA Duncan Dull),  February 07, 2009 5:09 PM  Follow-up for Phone Call        Rx called to pharmacy Follow-up by: Sydell Axon LPN,  February 08, 2009 9:41 AM    Prescriptions: [name] 0.25 MG TABS (ALPRAZOLAM) Take one by mouth three times a day  #90 x 3   Entered and Authorized by:   Shaune Leeks MD   Signed by:   Shaune Leeks MD on 02/07/2009   Method used:   Telephoned to ...       [name] (retail)       [address]       [place], [place]  [id number]       Ph: [phone]       Fax: [phone]   RxID:   [id number]

## 2010-03-08 NOTE — Assessment & Plan Note (Signed)
Summary: Follow-up   Vital Signs:  Patient profile:   77 year old male Weight:      178.75 pounds Temp:     98.1 degrees F oral Pulse rate:   64 / minute Pulse rhythm:   regular BP sitting:   148 / 70  (left arm) Cuff size:   large  Vitals Entered By: Sydell Axon LPN (May 04, 2009 11:59 AM) CC: follow-up visit   History of Present Illness: Pt is here with his son for followup of right ear pain. 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 He was told to use IBP regularly. It has not helped. He was up last nite most of the night with pain. He has used Auralgan with mild success and has used heat. He has also been taking Vicodin with limited temporal improvement. He also relates continued dizziness which seems as best I can tell to be related to head movement and sounds still as if it is vertigo; he did not discuss this with Dr Jenne Pane. The son is also interested in testiing for dementia which I will try to do next time.  Problems Prior to Update: 1)  Memory Loss  (ICD-780.93) 2)  Ear Pain, Right  (ICD-388.70) 3)  Systolic Murmur  (ICD-785.2) 4)  Pharyngitis  (ICD-462) 5)  Fatigue  (ICD-780.79) 6)  Esophageal Reflux  (ICD-530.81) 7)  Fm Hx Malignant Neoplasm Gastrointestinal Tract  (ICD-V16.0) 8)  Polyp, Colon  (ICD-211.3) 9)  Low Back Pain, Chronic  (ICD-724.2) 10)  Dizziness  (ICD-780.4) 11)  Screening For Malignannt Neoplasm, Site Nec  (ICD-V76.49) 12)  Disorder, Carbohydrate Metabolism Nos  (ICD-271.9) 13)  Hyprtrphy Prostate Bng w/o Urinary Obst/luts  (ICD-600.00) 14)  Erectile Dysfunction  (ICD-302.72) 15)  Benign Prostatic Hypertrophy, Hx of  (ICD-V13.8) 16)  Glucose Intolerance, Hx of  (ICD-V12.2) 17)  Diverticulosis, Colon Diverticula At 30 Cm On Flex  (ICD-562.10) 18)  Carotid Artery Disease  (ICD-433.10) 19)  Allergic Reaction (CASHEWS)  (ICD-995.3) 20)   Hypertension  (ICD-401.9) 21)  Hyperlipidemia With Cad, Elevated Trig  (ICD-272.4) 22)  Coronary Artery Disease  (ICD-414.00) 23)  Anxiety  (ICD-300.00)  Medications Prior to Update: 1)  Sertraline Hcl 50 Mg Tabs (Sertraline Hcl) .... Take 1 1/2 Tablet By Mouth Once A Day 2)  Xanax 0.25 Mg Tabs (Alprazolam) .... Take One By Mouth Three Times A Day 3)  Metoprolol Tartrate 50 Mg Tabs (Metoprolol Tartrate) .... Take  1 1/2 By Mouth Two Times A Day 4)  Hydrochlorothiazide 12.5 Mg Caps (Hydrochlorothiazide) .... Take One By Mouth Daily 5)  Viagra 50 Mg Tabs (Sildenafil Citrate) 6)  Efudex 5 % Crea (Fluorouracil) .... Aplly Two Times A Day As Needed 7)  Simvastatin 80 Mg Tabs (Simvastatin) .... One Tab By Mouth At Night 8)  Aspirin 81 Mg Tbec (Aspirin) .... Take One By Mouth Daily 9)  Vicodin 5-500 Mg Tabs (Hydrocodone-Acetaminophen) .... One Tsab By Mouth Two Times A Day  Allergies: 1)  ! * Aleve 2)  * Sulfa (Sulfonamides) Group 3)  Altace (Ramipril) 4)  Niaspan (Niacin (Antihyperlipidemic))  Physical Exam  General:  Well-developed,well-nourished,in mild  distress from right ear pain ; alert,appropriate and cooperative throughout examination Head:  Normocephalic and atraumatic without obvious abnormalities. No apparent alopecia but male pattern balding. Sinuses NT.  Eyes:  No corneal or conjunctival inflammation noted. EOMI. PERRLA.  Ears:  External ear exam shows no significant lesions or deformities.  Otoscopic examination reveals clear canals, tympanic membranes are intact bilaterally without bulging  or discharge. TMs are dull w/o erythema but have poor mobility.  Hearing is grossly normal bilaterally. Nose:  External nasal examination shows no deformity or inflammation. Nasal mucosa are pink and moist without lesions or exudates. Mouth:  Oral mucosa and oropharynx without lesions or exudates.  Teeth in good repair. Minimal erythema of the pharynx. L TMJ clicks, R glides reasonably  well. Neck:  no carotid bruit or thyromegaly no cervical or supraclavicular lymphadenopathy  Lungs:  Normal respiratory effort, chest expands symmetrically. Lungs are clear to auscultation, no crackles or wheezes. Heart:  Normal rate and regular rhythm. S1 and S2 normal without gallop, click, rub. Early II-III/VI sys murmur heard throughout. Neurologic:  No cranial nerve deficits noted. Station and gait are slow due to feeling off balance. Sensory, motor and coordinative functions appear intact. No clear reproducible nystagmus, but dizzier with lying down and sitting up. Finger-to-nose normal, heel-to-shin normal, and Romberg negative, but does sway some.     Impression & Recommendations:  Problem # 1:  EAR PAIN, RIGHT (ICD-388.70) Assessment Unchanged  See instructions. Will try steroids. May cont Auralgan and heat.  Discussed symptom control.   Problem # 2:  DIZZINESS (ICD-780.4) Assessment: Unchanged  Google Vertigo, see if daughter still has copy of exercises to do for vertigo or try Brandt/Daroff exercises. Showed him the site for the exercises on the web.  Problem # 3:  MEMORY LOSS (ICD-780.93) Assessment: Unchanged Will try to evaluate next visit.  Complete Medication List: 1)  Sertraline Hcl 50 Mg Tabs (Sertraline hcl) .... Take 1 1/2 tablet by mouth once a day 2)  Xanax 0.25 Mg Tabs (Alprazolam) .... Take one by mouth three times a day 3)  Metoprolol Tartrate 50 Mg Tabs (Metoprolol tartrate) .... Take  1 1/2 by mouth two times a day 4)  Hydrochlorothiazide 12.5 Mg Caps (Hydrochlorothiazide) .... Take one by mouth daily 5)  Viagra 50 Mg Tabs (Sildenafil citrate) 6)  Efudex 5 % Crea (Fluorouracil) .... Aplly two times a day as needed 7)  Simvastatin 80 Mg Tabs (Simvastatin) .... One tab by mouth at night 8)  Aspirin 81 Mg Tbec (Aspirin) .... Take one by mouth daily 9)  Vicodin 5-500 Mg Tabs (Hydrocodone-acetaminophen) .... One tsab by mouth two times a day 10)  Prednisone  20 Mg Tabs (Prednisone) .... 3 tabs by mouth in am.  Patient Instructions: 1)  RTC 2 weeks 2)  Apply heat as needed. 3)  Start Steroids, Prednisone 20mg  tablets. 4)  Try to do Brandt/Daroff Exercises for Vertigo. 5)  Take Tyl ES 2 tabs by mouth three times a day. Prescriptions: PREDNISONE 20 MG TABS (PREDNISONE) 3 tabs by mouth in AM.  #18 x 0   Entered and Authorized by:   Shaune Leeks MD   Signed by:   Shaune Leeks MD on 05/04/2009   Method used:   Electronically to        [name] (retail)       [address]       [place], [place]  [id number]       Ph: [phone]       Fax: [phone]   RxID:   [id number]   Current Allergies (reviewed today): ! * ALEVE * SULFA (SULFONAMIDES) GROUP ALTACE (RAMIPRIL) NIASPAN (NIACIN (ANTIHYPERLIPIDEMIC))

## 2010-03-10 ENCOUNTER — Inpatient Hospital Stay (INDEPENDENT_AMBULATORY_CARE_PROVIDER_SITE_OTHER)
Admission: RE | Admit: 2010-03-10 | Discharge: 2010-03-10 | Disposition: A | Discharge status: Home / Self Care | Payer: Medicare Other | Source: Ambulatory Visit | Attending: Family Medicine | Admitting: Family Medicine

## 2010-03-10 ENCOUNTER — Ambulatory Visit (HOSPITAL_COMMUNITY)
Admission: RE | Admit: 2010-03-10 | Discharge: 2010-03-10 | Disposition: A | Discharge status: Home / Self Care | Payer: Medicare Other | Source: Ambulatory Visit | Attending: Family Medicine | Admitting: Family Medicine

## 2010-03-10 DIAGNOSIS — M25473 Effusion, unspecified ankle: Secondary | ICD-10-CM | POA: Insufficient documentation

## 2010-03-10 DIAGNOSIS — M19079 Primary osteoarthritis, unspecified ankle and foot: Secondary | ICD-10-CM | POA: Insufficient documentation

## 2010-03-10 DIAGNOSIS — M25579 Pain in unspecified ankle and joints of unspecified foot: Secondary | ICD-10-CM | POA: Insufficient documentation

## 2010-03-10 DIAGNOSIS — M25476 Effusion, unspecified foot: Secondary | ICD-10-CM | POA: Insufficient documentation

## 2010-03-10 DIAGNOSIS — M109 Gout, unspecified: Secondary | ICD-10-CM

## 2010-03-10 LAB — GRAM STAIN

## 2010-03-10 LAB — SYNOVIAL CELL COUNT + DIFF, W/ CRYSTALS
Neutrophil, Synovial: 79 % — ABNORMAL HIGH (ref 0–25)
WBC, Synovial: 25700 /mm3 — ABNORMAL HIGH (ref 0–200)

## 2010-03-12 NOTE — Procedures (Unsigned)
CAROTID DUPLEX EXAM  INDICATION:  Followup carotid artery disease.  HISTORY: Diabetes:  No. Cardiac:  CABG. Hypertension:  Yes. Smoking:  Previous. Previous Surgery:  Bilateral carotid endarterectomy with Dacron patch angioplasty 1996, redo of right carotid endarterectomy 03/11/2002, both by Dr. Hart Rochester. CV History:  Asymptomatic. Amaurosis Fugax No, Paresthesias No, Hemiparesis No                                      RIGHT             LEFT Brachial systolic pressure: Brachial Doppler waveforms: Vertebral direction of flow:        Antegrade         Antegrade DUPLEX VELOCITIES (cm/sec) CCA peak systolic                   P=234, D=182      84 ECA peak systolic                   69                102 ICA peak systolic                   60                139 ICA end diastolic                   20                19 PLAQUE MORPHOLOGY:                  Mixed             Mixed PLAQUE AMOUNT:                      Mild              Mild/moderate PLAQUE LOCATION:                    ICA, CCA          ICA, CCA  IMPRESSION: 1. Bilateral internal carotid artery velocities suggest a 1% to 39%     stenosis, status post carotid endarterectomies. 2. Right common carotid artery stenosis.  ___________________________________________ Quita Skye Hart Rochester, M.D.  EM/MEDQ  D:  03/06/2010  T:  03/06/2010  Job:  [id number]

## 2010-03-14 NOTE — Progress Notes (Signed)
Summary: wants phone call   Phone Note Call from Patient Call back at [phone]   Caller: Son- [name] Call For: Shaune Leeks MD Summary of Call: Patient is asking if you could call him at your convenience regarding some quesions he has about his dad.  Initial call taken by: Melody Comas,  March 08, 2010 2:49 PM  Follow-up for Phone Call        Pt's son requesting Dad see a neurologist. Dr Hart Rochester "could not answer the question about brain blood supply and effect" and said he should see a neurologist. Will refer. Follow-up by: Shaune Leeks MD,  March 08, 2010 5:12 PM

## 2010-03-26 ENCOUNTER — Other Ambulatory Visit: Payer: Self-pay | Admitting: Dermatology

## 2010-04-10 ENCOUNTER — Other Ambulatory Visit: Payer: Self-pay | Admitting: Dermatology

## 2010-04-12 ENCOUNTER — Telehealth: Payer: Self-pay | Admitting: Family Medicine

## 2010-04-17 NOTE — Progress Notes (Signed)
Summary: refill request for simvastatin  Phone Note Refill Request Message from:  sonJillyn Hidden [phone]  Refills Requested: Medication #1:  SIMVASTATIN 80 MG TABS one tab by mouth at night Pt's son is requesting refills be sent to [facility], last refill said no more till pt had cholesterol checked.  Initial call taken by: Lowella Petties CMA, AAMA,  April 12, 2010 10:24 AM  Follow-up for Phone Call        Pls have pt get Hepatic Panel and Chol Profile done 272.4 and Bmet 401.9 Script sent. Follow-up by: Shaune Leeks MD,  April 12, 2010 11:12 AM  Additional Follow-up for Phone Call Additional follow up Details #1::        Advised pt's son, he will call back to schedule lab appt. Additional Follow-up by: Lowella Petties CMA, AAMA,  April 12, 2010 11:21 AM    Prescriptions: SIMVASTATIN 80 MG TABS (SIMVASTATIN) one tab by mouth at night  #30 Tablet x 0   Entered and Authorized by:   Shaune Leeks MD   Signed by:   Shaune Leeks MD on 04/12/2010   Method used:   Electronically to        [name] (retail)       [address]       [place], [place]  [id number]       Ph: [phone]       Fax: [phone]   RxID:   [id number]

## 2010-05-04 ENCOUNTER — Other Ambulatory Visit: Payer: Self-pay | Admitting: Family Medicine

## 2010-05-04 DIAGNOSIS — E785 Hyperlipidemia, unspecified: Secondary | ICD-10-CM

## 2010-05-04 DIAGNOSIS — I10 Essential (primary) hypertension: Secondary | ICD-10-CM

## 2010-05-07 ENCOUNTER — Other Ambulatory Visit (INDEPENDENT_AMBULATORY_CARE_PROVIDER_SITE_OTHER): Payer: Medicare Other | Admitting: Family Medicine

## 2010-05-07 ENCOUNTER — Encounter: Payer: Self-pay | Admitting: Internal Medicine

## 2010-05-07 ENCOUNTER — Ambulatory Visit (INDEPENDENT_AMBULATORY_CARE_PROVIDER_SITE_OTHER): Payer: Medicare Other | Admitting: Internal Medicine

## 2010-05-07 VITALS — BP 122/90 | HR 94 | Temp 98.0°F | Ht 65.0 in | Wt 181.0 lb

## 2010-05-07 DIAGNOSIS — E785 Hyperlipidemia, unspecified: Secondary | ICD-10-CM

## 2010-05-07 DIAGNOSIS — M109 Gout, unspecified: Secondary | ICD-10-CM

## 2010-05-07 DIAGNOSIS — I10 Essential (primary) hypertension: Secondary | ICD-10-CM

## 2010-05-07 LAB — COMPREHENSIVE METABOLIC PANEL
Albumin: 3.6 g/dL (ref 3.5–5.2)
Alkaline Phosphatase: 69 U/L (ref 39–117)
CO2: 30 mEq/L (ref 19–32)
Glucose, Bld: 104 mg/dL — ABNORMAL HIGH (ref 70–99)
Potassium: 4.3 mEq/L (ref 3.5–5.1)
Sodium: 127 mEq/L — ABNORMAL LOW (ref 135–145)
Total Protein: 6.4 g/dL (ref 6.0–8.3)

## 2010-05-07 LAB — LIPID PANEL
Cholesterol: 142 mg/dL (ref 0–200)
LDL Cholesterol: 77 mg/dL (ref 0–99)

## 2010-05-07 NOTE — Progress Notes (Signed)
  Subjective:    Patient ID: [name], male    DOB: [birth year], 77 y.o.   MRN: [id number]  HPI Having pain in his left ankle This happened 2 months ago and went to urgent care Got shot of cortisone in ankle and gave colcrys Apparently got crystal anaylsis and there were uric acid crystals  Colcrys worked but gave him diarrhea--just took bid  Has not tried any other pain meds---stomach issues  Past Medical History  Diagnosis Date  . Anxiety   . Hypertension   . Hyperlipidemia   . CAD (coronary artery disease)   . Dementia   . Gout, unspecified 2012    crystal analysis at urgent care    Past Surgical History  Procedure Date  . Cholecystectomy   . Endarterectomy   . Skin cancer removal 06/2007    Family History  Problem Relation Age of Onset  . Cancer Sister     colon  . Hypertension Brother   . Cancer Brother     History   Social History  . Marital Status: Widowed    Spouse Name: N/A    Number of Children: N/A  . Years of Education: N/A   Occupational History  . Not on file.   Social History Main Topics  . Smoking status: Former Games developer  . Smokeless tobacco: Not on file  . Alcohol Use:   . Drug Use:   . Sexually Active:    Other Topics Concern  . Not on file   Social History Narrative  . No narrative on file      Review of Systems No trauma Appetite okay     Objective:   Physical Exam  Constitutional: He appears well-developed and well-nourished. No distress.  Musculoskeletal:       Redness along lateral left ankle Not overly tender Mild limp when walking but can fully bear weight          Assessment & Plan:

## 2010-05-07 NOTE — Patient Instructions (Signed)
Please stop the hydrochlorthiazide (HCTZ) Take the colcrys daily till it runs out

## 2010-05-09 NOTE — Progress Notes (Signed)
Patient's son [name]) notified as instructed by telephone. Was informed by [name] that he will keep a close check on patient's BP and gouty symptoms and will call back if problems get worse. [name] stated that he will call back and schedule the follow-up appointment with Dr. Hetty Ely.

## 2010-05-09 NOTE — Progress Notes (Signed)
Quick Note:  Patient's son [name]) notified as instructed by telephone. Was informed by [name] that he will keep a close check on patient's BP and gouty symptoms and will call back if symptoms get worse. [name] stated that he will call back and schedule the follow-up appointment with Dr. Hetty Ely. ______

## 2010-05-09 NOTE — Progress Notes (Signed)
I agree the HCTZ could be culprit in gout flaring. Stop HCTZ but then needs to be seen in approx 4 weeks to assess BP.

## 2010-05-10 ENCOUNTER — Other Ambulatory Visit: Payer: Self-pay | Admitting: Family Medicine

## 2010-06-02 ENCOUNTER — Other Ambulatory Visit: Payer: Self-pay | Admitting: Family Medicine

## 2010-06-04 ENCOUNTER — Other Ambulatory Visit: Payer: Self-pay | Admitting: *Deleted

## 2010-06-04 MED ORDER — SERTRALINE HCL 50 MG PO TABS: 50.0000 mg | ORAL_TABLET | Freq: Every day | ORAL | Status: DC

## 2010-06-05 MED ORDER — ALPRAZOLAM 0.25 MG PO TABS: 0.2500 mg | ORAL_TABLET | Freq: Two times a day (BID) | ORAL | Status: DC

## 2010-06-05 NOTE — Telephone Encounter (Signed)
Please call refill 30/0.

## 2010-06-06 NOTE — Telephone Encounter (Signed)
Rx called to pharmacy as instructed. 

## 2010-06-08 ENCOUNTER — Encounter: Payer: Self-pay | Admitting: Family Medicine

## 2010-06-11 ENCOUNTER — Ambulatory Visit (INDEPENDENT_AMBULATORY_CARE_PROVIDER_SITE_OTHER): Payer: Medicare Other | Admitting: Family Medicine

## 2010-06-11 ENCOUNTER — Encounter: Payer: Self-pay | Admitting: Family Medicine

## 2010-06-11 ENCOUNTER — Ambulatory Visit: Payer: Medicare Other | Admitting: Family Medicine

## 2010-06-11 DIAGNOSIS — R221 Localized swelling, mass and lump, neck: Secondary | ICD-10-CM

## 2010-06-11 DIAGNOSIS — R220 Localized swelling, mass and lump, head: Secondary | ICD-10-CM

## 2010-06-11 DIAGNOSIS — I10 Essential (primary) hypertension: Secondary | ICD-10-CM

## 2010-06-11 DIAGNOSIS — I720 Aneurysm of carotid artery: Secondary | ICD-10-CM | POA: Insufficient documentation

## 2010-06-11 MED ORDER — MEMANTINE HCL 10 MG PO TABS: 10.0000 mg | ORAL_TABLET | Freq: Two times a day (BID) | ORAL | Status: DC

## 2010-06-11 MED ORDER — METOPROLOL TARTRATE 50 MG PO TABS: 100.0000 mg | ORAL_TABLET | Freq: Two times a day (BID) | ORAL | Status: DC

## 2010-06-11 MED ORDER — SIMVASTATIN 80 MG PO TABS: 40.0000 mg | ORAL_TABLET | Freq: Every day | ORAL | Status: DC

## 2010-06-11 MED ORDER — SERTRALINE HCL 50 MG PO TABS: 50.0000 mg | ORAL_TABLET | Freq: Every day | ORAL | Status: DC

## 2010-06-11 NOTE — Assessment & Plan Note (Addendum)
Inc BB and recheck in 2 weeks.

## 2010-06-11 NOTE — Progress Notes (Signed)
HTN-  Off HCTZ due to gout.  BP rechecked today.  No CP, SOB, BLE edema.   He'd only been on 50mg  of metoprolol bid.    Swollen lesion in neck.  Noted on Thursday.  It may have come up before, but had resolved.  Pt able to give some history but he has h/o dementia.  Not ttp.  No FCNAVD. No purulent rhinorrhea.  No cough.  No ear pain.  H/o skin cancer on face w/o known recurrence.    Meds, vitals, and allergies reviewed.   ROS: See HPI.  Otherwise, noncontributory.  Nad but not oriented to time ncat Op wnl, no erythema Neck supple and w/o LA or lesion except for 1.5cm lesion on R side of anterior neck, not ttp.  No skin erythema No clavicular or axillary nodes bilaterally rrr ctab Ext w/o edema  Recheck BP 170/80.

## 2010-06-11 NOTE — Assessment & Plan Note (Addendum)
Likely benign LA.  Will recheck in 2 weeks.  Benign exam o/w and w/o red flag sx by history (per patient or family).  All meds verified with family and updated copy give to daughter.

## 2010-06-11 NOTE — Patient Instructions (Signed)
I would bring in all meds to any appointment. I would cut the simvastatin in half.  Take 1/2 tab at night (now 40mg ).  This is less likely to cause muscle aches. Come back for a follow up appointment in 10 days so I can recheck your BP and your neck.   Let us know if you have fevers, more spots in your neck, or other concerns in the meantime.   See medication changes noted on the check out sheet.

## 2010-06-19 NOTE — Procedures (Signed)
CAROTID DUPLEX EXAM   INDICATION:  Follow-up bilateral carotid endarterectomies.   HISTORY:  Diabetes:  No.  Cardiac:  CABG.  Hypertension:  Yes.  Smoking:  Previous.  Previous Surgery:  Bilateral carotid endarterectomies in 1996 with redo  on the right side on 03/11/2002.  CV History:  Currently asymptomatic.  Amaurosis Fugax No, Paresthesias No, Hemiparesis No.                                       RIGHT             LEFT  Brachial systolic pressure:         156               158  Brachial Doppler waveforms:         Normal            Normal  Vertebral direction of flow:        Antegrade         Antegrade  DUPLEX VELOCITIES (cm/sec)  CCA peak systolic                   P=105, D=231      P=108, D=142  ECA peak systolic                   146               121  ICA peak systolic                   67                81  ICA end diastolic                   13                18  PLAQUE MORPHOLOGY:                  Mixed             Mixed  PLAQUE AMOUNT:                      Mild to moderate  Mild to moderate  PLAQUE LOCATION:                    ICA/ECA           ICA/CCA   IMPRESSION:  1. Patent bilateral carotid endarterectomy sites with 1-39% stenosis      of the bilateral internal carotid arteries noted.  2. Increased velocities noted in the bilateral distal common carotid      arteries, proximal to the endarterectomy patches, of 231 cm/s on      the right and 142 cm/s on the left.  3. No significant change noted when compared to the previous exam on      06/23/2007.   ___________________________________________  Quita Skye. Hart Rochester, M.D.   CH/MEDQ  D:  01/19/2008  T:  01/19/2008  Job:  [id number]

## 2010-06-19 NOTE — Procedures (Signed)
CAROTID DUPLEX EXAM   INDICATION:  Followup, carotid artery disease with increased feeling of  weakness, as if going to black out.   HISTORY:  Diabetes:  No.  Cardiac:  CABG.  Hypertension:  Yes.  Smoking:  Quit.  Previous Surgery:  Redo, right CEA with DPA in 03/11/02, bilateral CEA  with DPA in 1996, both by Dr. Hart Alvarado.  CV History:  No.  Amaurosis Fugax No, Paresthesias No, Hemiparesis No                                       RIGHT             LEFT  Brachial systolic pressure:         165               164  Brachial Doppler waveforms:         Biphasic          Biphasic  Vertebral direction of flow:        Antegrade         Antegrade  DUPLEX VELOCITIES (cm/sec)  CCA peak systolic                   M=129, D=215      M=87, D=140  ECA peak systolic                   69                98  ICA peak systolic                   94                104  ICA end diastolic                   12                11  PLAQUE MORPHOLOGY:                  Mixed             Mixed  PLAQUE AMOUNT:                      Mild              Mild  PLAQUE LOCATION:                    ICA/CCA           ICA/CCA   IMPRESSION:  1. Bilateral internal carotid artery stenosis of 1-39%.  2. However, right common carotid artery, mid = 129 cm/s, dropping to      80 cm/s, then back up to 215 cm/s at the proximal patch area.  3. Left common carotid artery, mid = 87 cm/s, increasing to 140 cm/s      at proximal patch.   ___________________________________________  [name]. [name], M.D.   AS/MEDQ  D:  06/23/2007  T:  06/23/2007  Job:  [id number]

## 2010-06-19 NOTE — Assessment & Plan Note (Signed)
OFFICE VISIT   [name], [name]  DOB:  14-Jan-1925                                       06/23/2007  EAVWU#:[id number]   The patient returns for annual followup of his carotid occlusive  disease.  He has had a right carotid endarterectomy and a redo right  carotid endarterectomy in 1996 and 2004, and a left carotid  endarterectomy in 1996.  He has no neurologic symptoms at this time and  we have been following him on an annual basis for recurrence.  He denies  any chest pain, dyspnea on exertion, PND, orthopnea.  He has no  hemiparesis, aphasia, amaurosis fugax, blurred vision or syncope.  He  has had some mild dizziness particularly while in the heat.  He also has  back pain which is being evaluated.  He has no lower extremity  claudication symptoms.  He is taking an aspirin a day.   PHYSICAL EXAM:  Blood pressure is 118/70, heart rate is 66, respirations  are 18.  Carotid pulses are 3+ with no significant bruits.  Neurologic:  Exam is normal.  No palpable adenopathy in neck.  Chest:  Clear to  auscultation.  Abdomen:  Soft, nontender with no masses.  Has 3+  femoral, popliteal pulses bilaterally with well-perfused lower  extremities.   Carotid duplex exam reveals no evidence of internal carotid flow  reduction.  He does have some mild stenosis at the junction of the  patch, the right slightly worse than the left but neither of these are  severe.   I think we will continue to watch him on an annual basis, unless he  develops any symptomatology.  He will return in 1 year for followup  scan.   Quita Skye Hart Rochester, M.D.  Electronically Signed   JDL/MEDQ  D:  06/23/2007  T:  06/24/2007  Job:  [id number]

## 2010-06-22 NOTE — Op Note (Signed)
NAME:  LONNEY, REVAK NO.:  [id number]   MEDICAL RECORD NO.:  [id number]                   PATIENT TYPE:  INP   LOCATION:  2872                                 FACILITY:  [facility]   PHYSICIAN:  Quita Skye. Hart Rochester, M.D.               DATE OF BIRTH:  1924-06-09   DATE OF PROCEDURE:  03/11/2002  DATE OF DISCHARGE:                                 OPERATIVE REPORT   PREOPERATIVE DIAGNOSIS:  Recurrent right common carotid stenosis -  asymptomatic.   POSTOPERATIVE DIAGNOSIS:  Recurrent right common carotid stenosis -  asymptomatic.   OPERATION:  Redo right carotid endarterectomy with Dacron patch angioplasty.   SURGEON:  Quita Skye. Hart Rochester, M.D.   FIRST ASSISTANT:  Eber Hong, P.A.   ANESTHESIA:  General endotracheal.   BRIEF HISTORY:  This patient previously underwent bilateral carotid  endarterectomies and has had a moderate stenosis in his common carotid  artery at the base of the endarterectomy site which has been followed with  Duplex scanning.  This has progressed to a severe state, and angiograms  confirmed the findings.  He is now scheduled for a redo carotid  endarterectomy and remains asymptomatic from a neurologic standpoint.   PROCEDURE:  The patient was taken to the operating room, placed in the  supine position, at which time satisfactory general endotracheal anesthesia  was administered.  The right neck was prepped with Betadine scrub and  solution and draped in routine sterile manner.  Incision was made through  the previous scar, carried down through subcutaneous tissue using the  scalpel.  Incision was carried down slightly more proximally than before,  and the common carotid artery was carefully dissected free.  Identification  of the vagus nerve was performed, and injury to the nerve was carefully  avoided.  The common internal and external carotid arteries were dissected  free up just past the bifurcation.  The entire  endarterectomy was not re-  exposed since the recurrent lesion was in the common carotid artery, and the  internal carotid was widely patent by the angiogram.  A #10 shunt was  prepared, and the patient was heparinized.  The carotid vessels were then  occluded with vascular clamps, longitudinal opening made in the common  carotid proximal to the previous patch, extended with the Potts scissors  through the patch.  There was a severe stenosis beginning just proximal to  the patch and extending into the tip of the patch causing about an 80-90%  narrowing.  This was slightly ulcerated.  The arteriotomy was extended up to  the carotid bulb.  The internal carotid above this had excellent  backbleeding, and the intimal lining was smooth and nonulcerated.  No shunt  was utilized because of the good backbleeding.  A standard endarterectomy  was performed starting at the proximal common carotid artery and up to the  bifurcation at which point the intima was  trimmed with the Potts scissors  leaving a smooth transition point.  A few tacking sutures of 6-0 Prolene  were inserted distally.  Following this and thorough irrigation, a new patch  was sewn into place with 6-0 Prolene.  Prior to completion of the closure,  appropriate flushing was performed, and following completion, external  carotid was opened initially followed by the internal carotid.  There was  excellent Doppler flow in both vessels.  Protamine was then given to reverse  the heparin.  Following adequate hemostasis, the wound was irrigated with saline.  A  Jackson-Pratt drain was brought out through an inferiorly-based stab wound,  secured with a nylon suture, and the wound closed with Vicryl in  subcuticular fashion.  Sterile dressing was applied, and the patient taken  to the recovery room in satisfactory condition.                                               Quita Skye Hart Rochester, M.D.    JDL/MEDQ  D:  03/11/2002  T:  03/11/2002   Job:  [id number]

## 2010-06-22 NOTE — Consult Note (Signed)
[place]  Patient:    [name], [name] Visit Number: [id number] MRN: [id number]          Service Type: DSU Location: [facility] 2899 25 Attending Physician:  Alyson Locket Dictated by:   Paulina Fusi, M.D. Proc. Date: 08/10/01 Admit Date:  07/08/2001 Discharge Date: 07/08/2001   CC:         Larina Earthly, M.D.  Caralee Ates, M.D.   Consultation Report  This is an interpretation of an intracranial arteriogram done on July 08, 2001, by Dr. Arbie Cookey and Dr. Elyn Peers.  Intracranial cerebral arteriography; the right internal carotid artery is opacified via common carotid artery injection.  There is no syphon stenosis. The supraclinoid ICA is normal.  The anterior and middle cerebral vessels appear normal without evidence of proximal stenosis or branch vessel disease. The venous phase is normal.  The left internal carotid artery is opacified via a common carotid artery injection.  There is no siphon or superclinoid stenosis.  The anterior and middle cerebral vessels appear normal without evidence of stenosis or aneurysm. No distal branch lesion is seen.  The venous phase is normal.  IMPRESSION:  Normal intracranial cerebral arteriography. Dictated by:   Paulina Fusi, M.D. Attending Physician:  Alyson Locket DD:  08/10/01 TD:  08/12/01 Job: [id number]

## 2010-06-22 NOTE — Op Note (Signed)
[place]. [facility]  Patient:    ANTWAIN, CALIENDO Visit Number: [id number] MRN: [id number]          Service Type: DSU Location: [facility] 2899 25 Attending Physician:  Alyson Locket Dictated by:   Larina Earthly, M.D. Proc. Date: 07/08/01 Admit Date:  07/08/2001 Discharge Date: 07/08/2001   CC:         Fayrene Fearing D. Hart Rochester, M.D.   Operative Report  PREOPERATIVE DIAGNOSIS:  Recurrent right carotid artery stenosis.  POSTOPERATIVE DIAGNOSIS:  Recurrent right carotid artery stenosis.  OPERATION PERFORMED:  Arch and cerebral arteriogram.  SURGEON:  Larina Earthly, M.D.  ASSISTANT:  Caralee Ates, M.D.  ANESTHESIA:  1% lidocaine local.  COMPLICATIONS:  None.  DISPOSITION:  To holding area stable.  DESCRIPTION OF PROCEDURE:  The patient was taken to the peripheral vascular cath lab and placed in supine position where the area of the right groin was prepped and draped in the usual sterile fashion.  Using local anesthesia and a single wall puncture, the right common femoral artery was entered and a guide wire was passed up to the level of arch.  A 5 French sheath was passed over the guide wire and a pigtail catheter was positioned in the aortic arch.  A left anterior oblique projection of approximately 30 degrees was used and this revealed widely patent innominate artery take off.  There was tortuosity of the subclavian and carotid on the right, no evidence of stenosis of the great vessels.  The right vertebral artery was the dominant vertebral artery.  There was late filling of the left vertebral artery.  There was moderate less than 50% stenosis of the left common carotid artery at the take off.  The left subclavian artery was widely patent.  Next the head hunter catheter was exchanged for the pigtail catheter and the innominate and then the right common carotid artery was selectively catheterized.  AP and lateral cervical intracranial views were taken  through this catheter and the right common carotid artery.  The intracranial portion of this exam will be dictated by [facility].  This revealed widely patent common carotid artery up to the area of the prior endarterectomy.  There was narrowing at the proximalmost portion of the prior endarterectomy.  There was approximately 50% narrowing in two planes with approximately 75% overall stenosis.  There was no evidence of stenosis in the carotid bifurcation or the internal carotid above the endarterectomy.  The only stenotic area was at the proximalmost portion of the prior endarterectomy.  Next the catheter was removed from the right common and the left common carotid artery was selectively catheterized.  Again AP and lateral, cervical and intracranial views were undertaken.  The prior endarterectomy was widely patent on the left.  There was a mild degree of narrowing at the take off of the proximal portion of the patch.  The patient tolerated the procedure well without immediate complication, was transferred to the holding area after the sheath was pulled. Dictated by:   Larina Earthly, M.D. Attending Physician:  Alyson Locket DD:  07/08/01 TD:  07/09/01 Job: [id number]

## 2010-06-22 NOTE — H&P (Signed)
NAME:  [name], [name] NO.:  [id number]   MEDICAL RECORD NO.:  [id number]                   PATIENT TYPE:  INP   LOCATION:  NA                                   FACILITY:  [facility]   PHYSICIAN:  Lissa Merlin, P.A.                    DATE OF BIRTH:  01-08-1925   DATE OF ADMISSION:  02/08/2002  DATE OF DISCHARGE:                                HISTORY & PHYSICAL   PHYSICIANS:  1. Attending physician Quita Skye. Hart Rochester, M.D.  2. Cardiology Nicki Guadalajara, M.D.  3. Primary care Laurita Quint, M.D.   CHIEF COMPLAINT:  Progressive carotid artery disease.   HISTORY OF PRESENT ILLNESS:  This is a 77 year old white male who has known  carotid artery disease. He previously had bilateral staged carotid  endarterectomy by Dr. Hart Rochester in 1996. He was later found to have a right  common carotid stenosis  proximal to the endarterectomy site. This was  followed for several years. He recently was found to have severe stenosis,  95% on duplex scan. He saw Dr. Hart Rochester at the office on January 05, 2002.  Because of  its now severe nature, Dr. Hart Rochester recommended proceeding with  redo carotid endarterectomy.   The patient has continued to be symptom free with regards to his stenosis.  He has had no TIA symptoms, specifically denying visual changes, speech  problems, weakness in his extremities. The patient has, however, had a head  cold. He had some diarrhea over  the last three days. He has asked if it  would be possible to postpone his surgery for a short time while he  recovers. After talking  with Dr. Hart Rochester, this is a reasonable request, and  his surgery is moved to Monday, February 08, 2002, which is only about three  weeks away.   PAST MEDICAL HISTORY:  1. Carotid artery disease.  2. Coronary artery disease.  3. Hypertension.  4. Hypercholesteremia.  5. Prostatitis.  6. Anxiety.   PAST SURGICAL HISTORY:  1. Right and left carotid endarterectomies in 1996 by Dr.  Hart Rochester.  2. Coronary artery bypass graft in 1997 by Dr. Donata Clay.  3. Cholecystectomy and vagectomy in 1995.  4. Tonsillectomy and adenoidectomy as a child.   REVIEW OF SYSTEMS:  He has had a mildly productive yellow cough lately. He  does not know if  he has had any fever. He says that he has had some chills,  malaise and some episodes of diarrhea. He also has a scratchy throat and ear  pain. He denies chest pain, palpitations, visual changes, speech changes,  weakness or numbness in his extremities. He denies shortness of breath and  denies dizziness.   MEDICATIONS:  1. Enteric coated aspirin q.d.  2. Lescol 40 mg q.d.  3. Lotensin 5 mg q.d.  4. Alprazolam 0.25 mg 2 to 3 times a day p.r.n.  5. Metoprolol  50 mg b.i.d.  6. Hydrochlorothiazide 50 mg q.d.   ALLERGIES:  SULFA.   FAMILY HISTORY:  Mother deceased at age 75 from ischemic bowel. Father  deceased at age 46 from heart problems. Sister deceased from colon cancer at  age 24. Brother deceased from bone cancer at age 48.   SOCIAL HISTORY:  He is a widower. He has five children who are supportive.  He is retired from a [employer]. He smoked 40 years ago but has had  none since. He denies alcohol use.   PHYSICAL EXAMINATION:  VITAL SIGNS:  Blood pressure 140/70, pulse 68 and  regular, temperature 98.2.  GENERAL:  A well developed, well nourished white male in no acute distress.  Alert and oriented x 3.  HEENT:  Normocephalic, atraumatic. Pupils equally round and reactive to  light. Extraocular movements intact. Visual acuity intact OU. Oropharynx is  slightly erythematous but overall not  very impressive. He has some clear  nasal discharge. The TMs are normal with no evidence of infection. Cone of  light intact.  NECK:  Supple, no JVD. He  has bilateral carotid bruits. He does  have some  prominent submandibular lymph nodes .  CHEST:  Symmetrical, breath sounds are clear and equal  anterior and  posterior. There  are no rhonchi.  CARDIAC:  Regular rate and rhythm, S1, S2 with no murmurs, rubs, gallops.  EXTREMITIES:  Warm and well perfused with no cyanosis, clubbing or edema. He  has palpable lower extremity pulses.  ABDOMEN:  Soft, nontender, nondistended with bowel sounds present, no masses  felt, no bruits heard.  NEUROLOGIC:  Grossly nonfocal. Deep tendon reflexes 2+. Gait is steady.  Muscular strength is +5/5.   ASSESSMENT AND PLAN:  This is a 77 year old white male with a history of  right and left carotid endarterectomies. He now presents with severe  recurrent right common carotid artery stenosis. Dr. Hart Rochester has recommended  redo the right carotid endarterectomy. This is scheduled for February 08, 2002. The patient has some  cold symptoms that he would like to subside  before proceeding with surgery. He is completely neurologically intact and  has had no symptoms with regards to his lesion.                                                Lissa Merlin, P.A.    JL/MEDQ  D:  01/19/2002  T:  01/20/2002  Job:  [id number]

## 2010-06-22 NOTE — H&P (Signed)
NAME:  [name], [name] NO.:  [id number]   MEDICAL RECORD NO.:  [id number]                   PATIENT TYPE:   LOCATION:                                       FACILITY:   PHYSICIAN:  Eber Hong, P.A.           DATE OF BIRTH:  16-Dec-1924   DATE OF ADMISSION:  03/11/2002  DATE OF DISCHARGE:                                HISTORY & PHYSICAL   CHIEF COMPLAINT:  Recurrent right carotid stenosis.   BRIEF HISTORY:  This patient is a 77 year old, white male with bilateral  carotid endarterectomies in September of 1996 by Dr. Hart Rochester.  He has been  followed routinely and has had a progression of his right carotid stenosis.  He recently underwent arteriogram which showed stenosis at the distal end of  the patch.  The stenosis continued to increase and his last operative  studies showed 95% stenosis of the right carotid by Doppler.  The patient  remains asymptomatic.  He has been followed by Dr. Hart Rochester and it was Dr.  Candie Chroman opinion that he should undergo re-do right carotid endarterectomy  to prevent stroke.  He is admitted electively for the same.   PAST MEDICAL HISTORY:  1. Coronary artery disease, status post coronary artery bypass grafting.  2. Hypertension.  This has probably been treated for approximately 15 years.     Has a history of bilateral carotid stenosis with bilateral carotid     endarterectomies.  3. History of anxiety.  4. History of hypercholesterolemia.   PAST SURGICAL HISTORY:  1. Right carotid endarterectomy and Dacron patch angioplasty 10/25/94.  2. Left carotid endarterectomy and Dacron patch angioplasty in 1996 by Dr.     Hart Rochester.  3. Coronary artery bypass grafting times four with left internal mammary to     the LAD, saphenous vein graft to the circum marginal sequentially,     saphenous vein graft to the second diagonal in 7/97, by Dr. Donata Clay.  4. He is status post cholecystectomy.   CURRENT MEDICATIONS:  Lescol 40 mg  every day at h.s.; Lopressor 50 mg  b.i.d.; Alprazolam (Xanax) 0.25 mg t.i.d., he has been on Xanax for two  years.  Hydrochlorothiazide 50 mg every day; Lotensin 5 mg every day and  aspirin 325 mg, one every day.   ALLERGIES:  Sulfa causes a rash.   REVIEW OF SYSTEMS:  This is essentially negative except for his history of  coronary artery disease, he has had some intermittent chest discomfort  approximately two weeks ago.  He has some reflux which he treats with an  over-the-counter preparation, he is not sure what it is.  He has dyspnea on  exertion after walking two miles.  GI:  Negative.  GU:  Negative.  EXTREMITIES:  Negative.   FAMILY HISTORY:  Mother died of gangrene.  Father died at 37, says his heart  gave out.   SOCIAL HISTORY:  He  is widowed, retired Visual merchandiser.  He does not use alcohol.  He smoked for approximately 20 years, probably a pack to less than a pack  per day.   PHYSICAL EXAMINATION:  GENERAL:  This is a well-nourished, well-developed,  white male in no acute distress.  VITAL SIGNS:  Blood pressure is 160/90 in the right arm, 140/90 in the left  arm.  Pulse was 76 and regular.  Respirations were 20 and unlabored.  HEAD:  Normocephalic.  EYES:  PERLA, EOM intact, fundi not visualized.  EARS/NOSE/THROAT/MOUTH:  Grossly within normal limits.  NECK:  Supple, no JVD, no bruits.  No thyromegaly or lymphadenopathy.  CHEST:  Symmetrical.  No wheezes, rales or rhonchi.  Midline  incision/sternotomy is well healed.  CARDIAC:  Regular rate and rhythm, no murmurs, rubs or gallops.  ABDOMEN:  Soft, nontender, positive bowel sounds, no palpable organomegaly,  slightly obese.  GU/RECTAL:  Deferred.  EXTREMITIES:  No clubbing, cyanosis or edema.  He has a well healed left leg  venectomy site.  Pulses are +2 in the carotids, femorals; +1 in the  popliteal, DP and PT bilaterally.  NEUROLOGICAL:  No focal deficits.   IMPRESSION:  1. Recurrent right carotid stenosis, up to 95%  by Doppler, without symptoms.  2. Coronary artery disease, status post coronary artery bypass graft times     four.  3. Hypertension.  4. Hypercholesterolemia.   PLAN:  Patient is admitted for elective carotid endarterectomy in the a.m.  with further work up and treatment as indicated.                                               Eber Hong, P.A.    WDJ/MEDQ  D:  03/09/2002  T:  03/09/2002  Job:  [id number]

## 2010-06-25 ENCOUNTER — Ambulatory Visit (INDEPENDENT_AMBULATORY_CARE_PROVIDER_SITE_OTHER): Payer: Medicare Other | Admitting: Family Medicine

## 2010-06-25 ENCOUNTER — Ambulatory Visit
Admission: RE | Admit: 2010-06-25 | Discharge: 2010-06-25 | Disposition: A | Discharge status: Home / Self Care | Payer: Medicare Other | Source: Ambulatory Visit | Attending: Family Medicine | Admitting: Family Medicine

## 2010-06-25 ENCOUNTER — Encounter: Payer: Self-pay | Admitting: Family Medicine

## 2010-06-25 VITALS — BP 144/76 | HR 66 | Temp 98.0°F | Wt 186.1 lb

## 2010-06-25 DIAGNOSIS — R221 Localized swelling, mass and lump, neck: Secondary | ICD-10-CM

## 2010-06-25 DIAGNOSIS — I10 Essential (primary) hypertension: Secondary | ICD-10-CM

## 2010-06-25 DIAGNOSIS — R220 Localized swelling, mass and lump, head: Secondary | ICD-10-CM

## 2010-06-25 DIAGNOSIS — M109 Gout, unspecified: Secondary | ICD-10-CM

## 2010-06-25 MED ORDER — HYDROCHLOROTHIAZIDE 12.5 MG PO TABS: 12.5000 mg | ORAL_TABLET | Freq: Every day | ORAL | Status: DC

## 2010-06-25 MED ORDER — ALLOPURINOL 100 MG PO TABS: 100.0000 mg | ORAL_TABLET | Freq: Every day | ORAL | Status: DC

## 2010-06-25 NOTE — Assessment & Plan Note (Addendum)
D/w pt/family about hctz and gout. If BP controlled and meds tolerated, then this should be fine.  If not, we'll have to consider other agents, such as a long acting nitrate.  Continue low dose of BB.

## 2010-06-25 NOTE — Patient Instructions (Signed)
Start taking allopurinol and hydrochlorothiazide, 1 tab of each, a day. Take 1 metoprolol twice a day. Let me know about BP readings next week. See Shirlee Limerick about your referral before your leave today. Take care.

## 2010-06-25 NOTE — Assessment & Plan Note (Addendum)
Begin low dose allopurinol and d/w pt/family.  He may have a flare.

## 2010-06-25 NOTE — Assessment & Plan Note (Addendum)
Refer for u/s.  This initially felt like it was mobile and I thought it was likely a LN.  Give the exam, I would proceed with u/s.  I told family and patient that I couldn't be sure of dx at this time given the exam.

## 2010-06-26 ENCOUNTER — Telehealth: Payer: Self-pay | Admitting: *Deleted

## 2010-06-26 ENCOUNTER — Encounter: Payer: Self-pay | Admitting: Family Medicine

## 2010-06-26 DIAGNOSIS — I10 Essential (primary) hypertension: Secondary | ICD-10-CM

## 2010-06-26 DIAGNOSIS — R221 Localized swelling, mass and lump, neck: Secondary | ICD-10-CM

## 2010-06-26 NOTE — Telephone Encounter (Signed)
2.5 cm mass adjacent to the carotid bulb. Considerations include glomus tumor, adenopathy, or thrombosed pseudoaneurysm. Recommend neck ct with contrast for further characterization.

## 2010-06-26 NOTE — Telephone Encounter (Signed)
I called the pt's son.  I would proceed with CT neck to eval the lesion.  It is an undetermined lesion and this may help classify it.  D/w them about check renal function with the contrast.  He understood.  He'd like to get it done next week.  I think this is reasonable.    Shirlee Limerick- Please let me know if you need order for Cr to recheck renal function.

## 2010-06-26 NOTE — Progress Notes (Signed)
HTN- unable to tolerate inc dose in BB due to bradycardia.  BP was improved, pulse was <60.  No CP.  Not sob.  Here with family.  Asking about going back on HCTZ.  BP had been under better control on this, but he has h/o gout.  Daughters interested in trying HCTZ with allopurinol. They are aware that both can cause gout flare. I would like to avoid CCB due to h/o brady and he is intolerant to ACE.  See plan.   Neck mass- here for fu.  No FCNAV.  Mass still present.   See plan.  Meds, vitals, and allergies reviewed.   ROS: See HPI.  Otherwise, noncontributory.  Nad but not oriented to time  ncat  Op wnl, no erythema  Neck supple and w/o LA or lesion except for 1.5cm lesion on R side of anterior neck, not ttp. No skin erythema.  The lesion doesn't move with skin manipulation.   rrr  ctab  Ext w/o edema

## 2010-06-27 NOTE — Telephone Encounter (Signed)
Dr Para March please put a lab order in for  A Bun/Creat before he can have any contrast. Thanks, Shirlee Limerick

## 2010-06-27 NOTE — Telephone Encounter (Signed)
Done

## 2010-06-28 NOTE — Telephone Encounter (Signed)
Lab appt on 06/29/2010, bun/creat. CT Neck at [facility] on 07/04/2010 at 12:45, son [name] notified of these appts. MK

## 2010-06-29 ENCOUNTER — Other Ambulatory Visit (INDEPENDENT_AMBULATORY_CARE_PROVIDER_SITE_OTHER): Payer: Medicare Other | Admitting: Family Medicine

## 2010-06-29 DIAGNOSIS — I10 Essential (primary) hypertension: Secondary | ICD-10-CM

## 2010-07-04 ENCOUNTER — Ambulatory Visit (INDEPENDENT_AMBULATORY_CARE_PROVIDER_SITE_OTHER)
Admission: RE | Admit: 2010-07-04 | Discharge: 2010-07-04 | Disposition: A | Discharge status: Home / Self Care | Payer: Medicare Other | Source: Ambulatory Visit | Attending: Family Medicine | Admitting: Family Medicine

## 2010-07-04 DIAGNOSIS — R220 Localized swelling, mass and lump, head: Secondary | ICD-10-CM

## 2010-07-04 DIAGNOSIS — R221 Localized swelling, mass and lump, neck: Secondary | ICD-10-CM

## 2010-07-04 MED ORDER — IOHEXOL 300 MG/ML  SOLN
75.0000 mL | Freq: Once | INTRAMUSCULAR | Status: AC | PRN
  Administered 2010-07-04: 75 mL via INTRAVENOUS

## 2010-07-05 ENCOUNTER — Telehealth: Payer: Self-pay | Admitting: *Deleted

## 2010-07-05 NOTE — Telephone Encounter (Signed)
I called Dr. Darrick Penna with vasc surgery Hart Rochester was out).  He'll have their clinic arrange for fu.  I called the patient's son and talked to him about this.  He understood.  I will await vasc surgery input.

## 2010-07-05 NOTE — Telephone Encounter (Signed)
Pt's son is asking that you review pt's CT results from yesterday and advise.

## 2010-07-10 ENCOUNTER — Ambulatory Visit (INDEPENDENT_AMBULATORY_CARE_PROVIDER_SITE_OTHER): Payer: Medicare Other | Admitting: Vascular Surgery

## 2010-07-10 DIAGNOSIS — I6529 Occlusion and stenosis of unspecified carotid artery: Secondary | ICD-10-CM

## 2010-07-11 NOTE — Assessment & Plan Note (Addendum)
OFFICE VISIT  TARAY, NORMOYLE DOB:  1924-09-11                                       07/10/2010 ZOXWR#:[id number]  The patient was referred today by Dr. 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  This patient is well known to me, having undergone a right carotid endarterectomy in 1996, a redo right carotid in 2004 with primary stenosis at the base of the patch in the common carotid area.  He has had subsequent duplex scans over the years, most recently in January 2012 where no pseudoaneurysm was noted.  Family noticed a "lump" in the right side of the neck.  A CT angiogram was ordered by Dr. Hetty Ely and performed a few weeks ago.  I have reviewed this.  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  This measures up to 2.7 cm in diameter.  The patient denies any hemiparesis, aphasia, amaurosis fugax, diplopia or syncope.  CHRONIC MEDICAL PROBLEMS: 1. Coronary artery disease. 2. Hypertension. 3. Hyperlipidemia.  SOCIAL HISTORY:  The patient is widowed, has 5 children, is retired, does not use tobacco or alcohol.  REVIEW OF SYSTEMS:  Denies any chest pain, dyspnea on exertion, PND, orthopnea.  Positive for anxiety.  All other systems are negative on complete review of systems.  PHYSICAL EXAMINATION:  Vital Signs:  Blood pressure 135/69, heart rate 85, respirations 20.  General:  He is a well-developed, elderly male in no apparent distress.  HEENT:  Exam is normal for age.  EOMs intact. Lungs:  Clear to auscultation.  No rhonchi or wheezing.  Cardiovascular: Exam reveals a regular rhythm with no murmurs.  Carotid pulses 3+.  No bruits are audible.  There is a small amount of fullness in the right neck near the carotid bifurcation, but no discrete mass is palpable. Abdomen:  Soft, nontender with no  masses.  Extremities:  He has 2+ femoral pulses bilaterally.  Neurologic:  Exam is normal.  I think the best way to determine any need for treatment would be to proceed with cerebral angiogram, which I have scheduled for Dr. Myra Gianotti to do on Tuesday, the 19th of June. We can then assess whether, in fact, he needs any treatment, and if so, if a carotid stent would be feasible, which would certainly be optimal.  I discussed with the patient today including the risks involved and will schedule that for Tuesday, the 19th of June, by Dr. Ivar Bury D. Hart Rochester, M.D. Electronically Signed  JDL/MEDQ  D:  07/10/2010  T:  07/11/2010  Job:  [id number]

## 2010-07-12 ENCOUNTER — Other Ambulatory Visit: Payer: Self-pay | Admitting: Family Medicine

## 2010-07-18 ENCOUNTER — Other Ambulatory Visit: Payer: Self-pay | Admitting: Family Medicine

## 2010-07-18 NOTE — Telephone Encounter (Signed)
Received refill request electronically from pharmacy. Is is okay to refill this?

## 2010-07-19 NOTE — Telephone Encounter (Signed)
Script called to [facility]

## 2010-07-19 NOTE — Telephone Encounter (Signed)
Please call in

## 2010-07-24 ENCOUNTER — Ambulatory Visit (HOSPITAL_COMMUNITY)
Admission: RE | Admit: 2010-07-24 | Discharge: 2010-07-24 | Disposition: A | Discharge status: Home / Self Care | Payer: Medicare Other | Source: Ambulatory Visit | Attending: Surgery | Admitting: Surgery

## 2010-07-24 DIAGNOSIS — I720 Aneurysm of carotid artery: Secondary | ICD-10-CM | POA: Insufficient documentation

## 2010-07-24 DIAGNOSIS — I739 Peripheral vascular disease, unspecified: Secondary | ICD-10-CM

## 2010-07-24 DIAGNOSIS — I6529 Occlusion and stenosis of unspecified carotid artery: Secondary | ICD-10-CM

## 2010-07-24 DIAGNOSIS — Y838 Other surgical procedures as the cause of abnormal reaction of the patient, or of later complication, without mention of misadventure at the time of the procedure: Secondary | ICD-10-CM | POA: Insufficient documentation

## 2010-07-24 DIAGNOSIS — Z01810 Encounter for preprocedural cardiovascular examination: Secondary | ICD-10-CM | POA: Insufficient documentation

## 2010-07-24 DIAGNOSIS — G988 Other disorders of nervous system: Secondary | ICD-10-CM | POA: Insufficient documentation

## 2010-07-24 LAB — POCT I-STAT, CHEM 8
BUN: 18 mg/dL (ref 6–23)
Creatinine, Ser: 1.3 mg/dL (ref 0.50–1.35)
Glucose, Bld: 116 mg/dL — ABNORMAL HIGH (ref 70–99)
Hemoglobin: 16 g/dL (ref 13.0–17.0)
Potassium: 3.9 mEq/L (ref 3.5–5.1)

## 2010-07-31 ENCOUNTER — Ambulatory Visit (INDEPENDENT_AMBULATORY_CARE_PROVIDER_SITE_OTHER): Payer: Medicare Other | Admitting: Vascular Surgery

## 2010-07-31 ENCOUNTER — Other Ambulatory Visit: Payer: Medicare Other

## 2010-07-31 DIAGNOSIS — I6529 Occlusion and stenosis of unspecified carotid artery: Secondary | ICD-10-CM

## 2010-08-01 ENCOUNTER — Other Ambulatory Visit: Payer: Self-pay | Admitting: *Deleted

## 2010-08-01 MED ORDER — ALPRAZOLAM 0.25 MG PO TABS: 0.2500 mg | ORAL_TABLET | Freq: Two times a day (BID) | ORAL | Status: DC

## 2010-08-01 NOTE — Telephone Encounter (Signed)
Please call in

## 2010-08-01 NOTE — Assessment & Plan Note (Signed)
OFFICE VISIT  WILLAM, MUNFORD DOB:  July 10, 1924                                       07/31/2010 HQION#:[id number]  The patient returns today for further discussion regarding his right carotid pseudoaneurysm which I evaluated a few weeks ago.  He had an angiogram performed by Dr. Myra Gianotti June 19th, which I have reviewed and also demonstrated to the family.  He does have what appears to be a patch of disruption in his right common carotid artery at the bifurcation of the internal and external with a fairly significant bulge.  The internal carotid is a widely patent vessel, as is the external carotid.  After studying this, I am very concerned that this could have a significant risk of disruption or distal embolization.  The patient has had no neurologic symptoms at this time.  He has only noticed a lump on the right side of the neck a few months ago.  CHRONIC MEDICAL PROBLEMS: 1. Coronary artery disease which is stable. 2. Hypertension. 3. Hyperlipidemia.  SOCIAL HISTORY:  He is widowed and has 5 children.  He is retired.  Does not use tobacco or alcohol.  PHYSICAL EXAMINATION:  Physical exam today is unchanged from a few weeks ago.  Vital Signs:  Blood pressure 131/77, heart rate 68, respirations 22.  General:  He is an elderly male who is in no apparent distress, fairly active.  Neck:  Exam reveals a palpable mass in the right angle of the mandible measuring about 2.5 to 3 cm in diameter.  He does have a 2+ to 3+ carotid pulse on the right.  Neurologic:  Exam is normal.  I think we should seriously consider surgical intervention, although the previous operative note from 1996 did describe a very high dissection up near the base of the skull.  Possibly, we could treat this without having to dissect real high in the neck by replacing the patch, or it may require placement of an interposition graft.  I have discussed these problems with the patient,  including the son and the daughter.  We will obtain a nuclear medicine Cardiolite study to determine his cardiac status, and then he will return in 2 weeks to further discuss his options.    Quita Skye Hart Rochester, M.D. Electronically Signed  JDL/MEDQ  D:  07/31/2010  T:  08/01/2010  Job:  [id number]

## 2010-08-02 NOTE — Telephone Encounter (Signed)
Rx phoned to pharmacy.  

## 2010-08-17 ENCOUNTER — Ambulatory Visit (INDEPENDENT_AMBULATORY_CARE_PROVIDER_SITE_OTHER): Payer: Medicare Other | Admitting: Cardiology

## 2010-08-17 ENCOUNTER — Encounter: Payer: Self-pay | Admitting: Cardiology

## 2010-08-17 DIAGNOSIS — Z01810 Encounter for preprocedural cardiovascular examination: Secondary | ICD-10-CM

## 2010-08-17 DIAGNOSIS — I4892 Unspecified atrial flutter: Secondary | ICD-10-CM

## 2010-08-17 DIAGNOSIS — Z01818 Encounter for other preprocedural examination: Secondary | ICD-10-CM

## 2010-08-17 DIAGNOSIS — I10 Essential (primary) hypertension: Secondary | ICD-10-CM

## 2010-08-17 DIAGNOSIS — I4891 Unspecified atrial fibrillation: Secondary | ICD-10-CM

## 2010-08-17 NOTE — Patient Instructions (Addendum)
Your physician has requested that you have a lexiscan myoview. For further information please visit [email or web address] Please follow instruction sheet, as given.  Please continue your current medications.  Follow up with Dr Antoine Poche after your surgery.

## 2010-08-17 NOTE — Assessment & Plan Note (Signed)
The blood pressure is at target. No change in medications is indicated. We will continue with therapeutic lifestyle changes (TLC).  

## 2010-08-17 NOTE — Assessment & Plan Note (Signed)
The patient will need screening with a stress perfusion study. He would not be a little walk on a treadmill and so he will have a [employer].

## 2010-08-17 NOTE — Assessment & Plan Note (Signed)
This is a new diagnosis. He will eventually need Coumadin. I discussed this with his family. Since he has an upcoming surgery I will not start this now. We will be happy to be involved at the time of surgery should rate control be an issue. We need to be notified at the time of discharge as to when we can start Coumadin.

## 2010-08-17 NOTE — Progress Notes (Signed)
HPI Patient presents for evaluation preoperatively prior to having repair of her right carotid pseudoaneurysm. He has a history of coronary disease followed previously by another cardiologist though he hasn't seen anybody in some time. His history is limited by dementia that is provided by his son. He is relatively sedentary but he does some minimal activities in his yard. He does get dyspneic and fatigued when walking even moderate shortness and some level ground. He denies any chest pressure, neck or arm discomfort. He has no palpitations, presyncope or syncope. He has had no weight gain or edema. Of note today's EKG demonstrates atrial fibrillation which is apparently a new finding.  Allergies  Allergen Reactions  . Naproxen Sodium     REACTION: rash all over  . Niacin     REACTION: unspecified  . Ramipril     REACTION: syncope  . Sulfonamide Derivatives     REACTION: unspecified (no HCTZ allergy)    Current Outpatient Prescriptions  Medication Sig Dispense Refill  . allopurinol (ZYLOPRIM) 100 MG tablet Take 1 tablet (100 mg total) by mouth daily.  90 tablet  3  . ALPRAZolam (XANAX) 0.25 MG tablet Take 1 tablet (0.25 mg total) by mouth 2 (two) times daily.  60 tablet  1  . aspirin 81 MG tablet Take 81 mg by mouth daily.        . hydrochlorothiazide (HYDRODIURIL) 12.5 MG tablet Take 1 tablet (12.5 mg total) by mouth daily.  90 tablet  3  . memantine (NAMENDA) 10 MG tablet Take 1 tablet (10 mg total) by mouth 2 (two) times daily.  60 tablet  11  . metoprolol (LOPRESSOR) 50 MG tablet Take 50 mg by mouth 2 (two) times daily.        . sertraline (ZOLOFT) 50 MG tablet Take 1 tablet (50 mg total) by mouth daily.  45 tablet  11  . simvastatin (ZOCOR) 80 MG tablet Take 0.5 tablets (40 mg total) by mouth at bedtime.  30 tablet  11    Past Medical History  Diagnosis Date  . Anxiety   . Hypertension   . Hyperlipidemia   . CAD (coronary artery disease)   . Dementia   . Gout, unspecified 2012      crystal analysis at urgent care    Past Surgical History  Procedure Date  . Cholecystectomy   . Endarterectomy 1996    Bilateral (2 weeks apart)  . Skin cancer removal 06/2007    (? basal cell)  . 4 vessel cabg 06/97  . Cardiolite 1999    Tresa Endo) this year no change  . Carotid US 08/00    unchanged ([facility])  . Carotid US 09/01    unchanged ([facility])  . Carotid US 11/02    Mild progression  . Persantine cardiolite 06/19/01    mild inferior ischemia  . Carotid arteriogram 07/08/01    (Early) 75% stenosis  . Carotid endarterectomy 03/11/02  . Persantine cardiolite 06/22/03    scar EF 68%  . Carotid US 06/21/05    unchanged ([facility] - [facility] Rochester)  . Persantine myoview 07/09/05    no change EF 68%  . Mri brain 02/23/10    No acute abnormality Chronic Cerebral Ischemia Stable Ventr Prominence due to Volume loss    Family History  Problem Relation Age of Onset  . Cancer Sister     colon  . Hypertension Brother   . Cancer Brother     intestine  . Cancer Brother  Brain CA  . Cancer Sister     melanoma  . Coronary artery disease Son     71    History   Social History  . Marital Status: Widowed    Spouse Name: N/A    Number of Children: 5  . Years of Education: N/A   Occupational History  . Retired from [employer]    Social History Main Topics  . Smoking status: Former Smoker    Types: Cigarettes  . Smokeless tobacco: Not on file   Comment: Remote mild smoking history  . Alcohol Use: Not on file  . Drug Use: Not on file  . Sexually Active: Not on file   Other Topics Concern  . Not on file   Social History Narrative   Son lives with patient, helps out at home.    ROS:  The patient is pleasantly confused but denies any active symptoms.  PHYSICAL EXAM BP 90/66  Pulse 72  Ht 5\' 5"  (1.651 m)  Wt 189 lb (85.73 kg)  BMI 31.45 kg/m2 GENERAL:  Well appearing HEENT:  Pupils equal round and reactive, fundi not visualized, oral mucosa unremarkable NECK:  No  jugular venous distention, waveform within normal limits, carotid upstroke brisk and symmetric, no thyromegaly, carotid scars LYMPHATICS:  No cervical, inguinal adenopathy LUNGS:  Clear to auscultation bilaterally BACK:  No CVA tenderness CHEST:  Unremarkable HEART:  PMI not displaced or sustained,S1 and S2 within normal limits, no S3, no S4, no clicks, no rubs, no murmurs, irregular ABD:  Flat, positive bowel sounds normal in frequency in pitch, no bruits, no rebound, no guarding, no midline pulsatile mass, no hepatomegaly, no splenomegaly EXT:  2 plus pulses throughout, moderate edema, no cyanosis no clubbing  SKIN:  No rashes no nodules NEURO:  Cranial nerves II through XII grossly intact, motor grossly intact throughout PSYCH:  Cognitively intact, oriented to person place and time  EKG:  Atrial flutter with variable conduction, right axis deviation, old anteroseptal infarct  ASSESSMENT AND PLAN

## 2010-08-19 NOTE — Op Note (Signed)
NAME:  [name], [name] NO.:  [id number]  MEDICAL RECORD NO.:  [id number]  LOCATION:  SDSC                         FACILITY:  [facility]  PHYSICIAN:  Juleen China IV, MDDATE OF BIRTH:  1924/12/20  DATE OF PROCEDURE:  07/24/2010 DATE OF DISCHARGE:  07/24/2010                              OPERATIVE REPORT   PREOPERATIVE DIAGNOSIS:  Right carotid pseudoaneurysm.  POSTOPERATIVE DIAGNOSIS:  Right carotid pseudoaneurysm.  PROCEDURES PERFORMED: 1. Ultrasound access right femoral artery. 2. Abdominal aortic angiogram. 3. Aortic arch angiogram. 4. Bilateral carotid angiogram (first and second order     catheterization).  INDICATIONS:  [name] is an 77 year old gentleman who is status post bilateral carotid endarterectomies with a redo on the right.  Ultrasound has detected as pseudoaneurysm and comes today for further diagnostic imaging.  PROCEDURE IN DETAILS:  The patient was identified in the holding area and taken to room A, placed supine on the table.  The right groin was prepped and draped in usual fashion.  Time-out was called.  The right femoral artery was evaluated with ultrasound and found to be widely patent.  A digital ultrasound image was acquired.  The right femoral artery was accessed under ultrasound guidance.  An 18-gauge needle and 0.35 wire was advanced into the iliac artery with no resistance.  A 5- French sheath was placed.  I then advanced a pigtail catheter in the right common iliac artery and could not get to go any further, therefore I hand-injected run was performed with a catheter in the right common iliac artery.  This demonstrated the course of the right common iliac. I was then able to use a KMP catheter and a Bentson wire to get across the area and then into the aorta.  I then placed a pigtail catheter in the ascending aorta and the aortic arch angiogram was performed.  Next, using a Bernstein II catheter and Bentson wire, the right  common carotid artery was selected, a right carotid angiogram was performed with intracranial images.  Next, the left common carotid artery was selected with a Bernstein II catheter and a Bentson wire and a left carotid angiogram was performed with intracranial images.  The intracranial portion will be interpreted separately by Neuroradiology  Abdominal aortogram:  High-grade approximately 80% stenosis at the origin of the right common iliac artery.  Bilateral common iliac arteries are patent and calcified.  Aortic arch angiogram:  A type 1 aortic arch is identified.  The innominate artery is widely patent.  Right subclavian artery is patent. The right vertebral artery originates from the right subclavian artery. The left subclavian artery is patent.  Left vertebral artery originates from the left subclavian artery.  No definitive stenosis identified.  Right carotid angiogram:  The right common carotid artery is patent. There is approximately 30% luminal narrowing in the distal common carotid artery.  Dilatation of the carotid artery most likely corresponding with the location of the patch is seen consistent with pseudoaneurysm.  There is no evidence of common or internal or external carotid stenosis.  Left carotid angiogram:  Left common carotid artery is widely patent. The left internal carotid artery and external carotid artery are widely patent.  At this point in time, decision was made to terminate the procedure. Catheters and wires were removed.  The patient was taken to the holding area for sheath pull.  He was neurologically intact after the procedure.  IMPRESSION: 1. Dilated distal right common carotid artery consistent with     pseudoaneurysm.  No evidence of carotid stenosis on the right. 2. Postoperative changes in the left carotid system without     significant stenosis.     Sean Ny, MD     VWB/MEDQ  D:  07/24/2010  T:  07/25/2010  Job:   [id number]  Electronically Signed by Arelia Longest IV MD on 08/19/2010 11:01:53 PM

## 2010-08-21 ENCOUNTER — Ambulatory Visit: Payer: Medicare Other | Admitting: Vascular Surgery

## 2010-08-21 ENCOUNTER — Encounter: Payer: Self-pay | Admitting: *Deleted

## 2010-08-22 ENCOUNTER — Ambulatory Visit (HOSPITAL_COMMUNITY): Payer: Medicare Other | Attending: Cardiology | Admitting: Radiology

## 2010-08-22 VITALS — Ht 65.0 in | Wt 187.0 lb

## 2010-08-22 DIAGNOSIS — I959 Hypotension, unspecified: Secondary | ICD-10-CM

## 2010-08-22 DIAGNOSIS — R0989 Other specified symptoms and signs involving the circulatory and respiratory systems: Secondary | ICD-10-CM

## 2010-08-22 DIAGNOSIS — Z01810 Encounter for preprocedural cardiovascular examination: Secondary | ICD-10-CM | POA: Insufficient documentation

## 2010-08-22 DIAGNOSIS — I25810 Atherosclerosis of coronary artery bypass graft(s) without angina pectoris: Secondary | ICD-10-CM

## 2010-08-22 DIAGNOSIS — I4891 Unspecified atrial fibrillation: Secondary | ICD-10-CM

## 2010-08-22 DIAGNOSIS — R0609 Other forms of dyspnea: Secondary | ICD-10-CM

## 2010-08-22 MED ORDER — TECHNETIUM TC 99M TETROFOSMIN IV KIT
11.0000 | PACK | Freq: Once | INTRAVENOUS | Status: AC | PRN
  Administered 2010-08-22: 11 via INTRAVENOUS

## 2010-08-22 MED ORDER — SODIUM CHLORIDE 0.9 % IV SOLN
INTRAVENOUS | Status: DC
  Administered 2010-08-22: 14:00:00 via INTRAVENOUS

## 2010-08-22 MED ORDER — TECHNETIUM TC 99M TETROFOSMIN IV KIT
33.0000 | PACK | Freq: Once | INTRAVENOUS | Status: AC | PRN
  Administered 2010-08-22: 33 via INTRAVENOUS

## 2010-08-22 MED ORDER — REGADENOSON 0.4 MG/5ML IV SOLN
0.4000 mg | Freq: Once | INTRAVENOUS | Status: AC
  Administered 2010-08-22: 0.4 mg via INTRAVENOUS

## 2010-08-22 NOTE — Progress Notes (Addendum)
[facility] SITE 3 NUCLEAR MED [address] [place] [address] [phone]  Cardiology Nuclear Med Study  [name] is a 77 y.o. male [id number] [birth year]   Nuclear Med Background Indication for Stress Test:  Evaluation for Ischemia; Graft Patency, Abnormal EKG:New Atrial Flutter and Surgical Clearance pending repair of (R) carotid pseudoaneurysm by Dr. Josephina Gip History:  '97 CABG and '07 OZH:[id number] scar, mild antero-lateral ischemia, EF=63%; no change from previous study. Cardiac Risk Factors: Carotid Disease, Family History - CAD, History of Smoking, Hypertension, Lipids and Obesity  Symptoms:  DOE and Fatigue   Nuclear Pre-Procedure Caffeine/Decaff Intake:  None NPO After: 7:30am   Lungs:  Clear.  O2 Sat 97% on RA. IV 0.9% NS with Angio Cath:  22g  IV Site: R Wrist  IV Started by:  Irean Hong, RN  Chest Size (in):  44 Cup Size: n/a  Height: 5\' 5"  (1.651 m)  Weight:  187 lb (84.823 kg)  BMI:  Body mass index is 31.12 kg/(m^2). Tech Comments:  Held metoprolol this am    Nuclear Med Study 1 or 2 day study: 1 day  Stress Test Type:  Lexiscan  Reading MD: Marca Ancona, MD  Order Authorizing Provider:  Rollene Rotunda, MD  Resting Radionuclide: Technetium 68m Tetrofosmin  Resting Radionuclide Dose: 11.0 mCi   Stress Radionuclide:  Technetium 24m Tetrofosmin  Stress Radionuclide Dose: 33.0 mCi           Stress Protocol Rest HR: 73 Stress HR: 113  Rest BP: 139/88 Stress BP: 88/51  Exercise Time (min): n/a METS: n/a   Predicted Max HR: 137 bpm % Max HR: 82.48 bpm Rate Pressure Product: 8249   Dose of Adenosine (mg):  n/a Dose of Lexiscan: 0.4 mg  Dose of Atropine (mg): n/a Dose of Dobutamine: n/a mcg/kg/min (at max HR)  Stress Test Technologist: Smiley Houseman, CMA-N  Nuclear Technologist:  Doyne Keel, CNMT     Rest Procedure:  Myocardial perfusion imaging was performed at rest 45 minutes following the intravenous  administration of Technetium 32m Tetrofosmin.  Rest ECG: Atrial flutter with frequent PVC's.  Stress Procedure:  The patient received IV Lexiscan 0.4 mg over 15-seconds.  Technetium 54m Tetrofosmin injected at 30-seconds.  There were no significant ST-T wave changes, but he did have a hypotensive response to [employer].  His BP dropped to 77/54 and he became lightheaded.  He was given 500 cc of normal saline with relief and BP back up to 123/62.  Quantitative spect images were obtained after a 45 minute delay.  Stress ECG: No significant change from baseline ECG  QPS Raw Data Images:  Normal; no motion artifact; normal heart/lung ratio. Stress Images:  Moderate basal inferior perfusion defect.  Rest Images:  Mild basal inferior perfusion defect.  Subtraction (SDS):  Partially reversible basal inferior perfusion defect.  Transient Ischemic Dilatation (Normal <1.22):  1.07 Lung/Heart Ratio (Normal <0.45):  0.38  Quantitative Gated Spect Images QGS EDV:  n/a QGS ESV:  n/a QGS cine images:  Study not gated QGS EF: Study not gated  Impression Exercise Capacity:  Lexiscan with no exercise. BP Response:  Hypotensive blood pressure response. Clinical Symptoms:  Lightheaded in the setting of hypotension.  ECG Impression:  Atrial fibrillation at baseline.  No significant ST segment changes.  Frequent PVCs.  Comparison with Prior Nuclear Study: No images to compare  Overall Impression:  Low risk stress nuclear study.  There is a partially reversible basal inferior perfusion defect.  Cannot rule out a small area of ischemia but may be diaphragmatic attenuation.   Marca Ancona  Low risk study.  No further cardiovascular studies.  He needs continued risk reduction and I will be treating his new afib as mentioned.  He will be at acceptable risk for planned surgery.  Rollene Rotunda

## 2010-08-23 NOTE — Progress Notes (Signed)
nuc med report routed to Dr. Antoine Poche 08/23/10 [name]

## 2010-08-23 NOTE — Progress Notes (Signed)
Son [name] is aware of results of stress test and that pt is cleared for surgery.

## 2010-08-26 HISTORY — PX: PSEUDOANEURYSM REPAIR: SHX2272

## 2010-08-28 ENCOUNTER — Ambulatory Visit (INDEPENDENT_AMBULATORY_CARE_PROVIDER_SITE_OTHER): Payer: Medicare Other | Admitting: Vascular Surgery

## 2010-08-28 ENCOUNTER — Telehealth: Payer: Self-pay | Admitting: Cardiology

## 2010-08-28 ENCOUNTER — Encounter: Payer: Self-pay | Admitting: Vascular Surgery

## 2010-08-28 VITALS — BP 106/65 | HR 54 | Resp 24 | Ht 64.0 in | Wt 189.0 lb

## 2010-08-28 DIAGNOSIS — I720 Aneurysm of carotid artery: Secondary | ICD-10-CM

## 2010-08-28 DIAGNOSIS — IMO0002 Reserved for concepts with insufficient information to code with codable children: Secondary | ICD-10-CM

## 2010-08-28 DIAGNOSIS — I4891 Unspecified atrial fibrillation: Secondary | ICD-10-CM

## 2010-08-28 NOTE — Progress Notes (Signed)
Subjective:     Patient ID: [name], male   DOB: 05-11-1924, 77 y.o.   MRN: [id number]  HPI [name] returns today with his family to discuss possible surgery for his pseudoaneurysm in the right carotid endarterectomy site. He originally had right carotid surgery in 1996 and had redo surgery in 2004. He was recently found to have a pulsatile mass in the right side of his neck and an angiogram performed by Dr. Myra Gianotti revealed a definite pseudoaneurysm with apparent patch disruption at the carotid bifurcation on the right side. He denies any hemispheric anonymous or TIAs amaurosis fugax diplopia blurred vision or syncope. He had a negative cardiac stress test performed by Dr. Antoine Poche office recently. He will need anticoagulation with Coumadin postoperatively. His biggest other problem is chronic dementia.  Review of Systems he denies any cardiac symptoms such as chest pain dyspnea on exertion PND orthopnea. He has no neurologic symptoms other than his confusion. All other systems are negative.    Objective:   Physical Exam general.-He is alert and oriented x3 with mild pleasant confusion HEENT EOMs intact normal for age Cardiovascular-he has an irregular rhythm with no murmurs noted. Carotid pulses are 3+. There is a small pulsatile mass in the right neck at the angle of the mandible. Respiratory-no rhonchi or wheezing is noted Abdomen-soft and nontender Neurologic-normal except for confusion Skin-free of rashes Musculoskeletal no obvious abnormalities noted     Assessment:     I discussed at length the risks and benefits of this procedure with the patient and his family as well as possible risk of stroke or cranial nerve injury or cardiac events. They would like to proceed with repair of his right carotid pseudoaneurysm and I scheduled that for later this week.    Plan:     #1 repair of right carotid pseudoaneurysm with possible saphenous vein graft from left leg to be done on  Friday, July 27 #2 Will begin chronic anticoagulation about a week postoperatively for his atrial flutter-to be managed by Dr. Antoine Poche

## 2010-08-28 NOTE — Progress Notes (Signed)
2 week fu carotid

## 2010-08-28 NOTE — Telephone Encounter (Signed)
Faxed OV, EKG & Stress to [facility] @ Dr. Candie Chroman Office ([phone]).

## 2010-08-30 ENCOUNTER — Encounter (HOSPITAL_COMMUNITY)
Admission: RE | Admit: 2010-08-30 | Discharge: 2010-08-30 | Disposition: A | Discharge status: Home / Self Care | Payer: Medicare Other | Source: Ambulatory Visit | Attending: Vascular Surgery | Admitting: Vascular Surgery

## 2010-08-30 ENCOUNTER — Ambulatory Visit (HOSPITAL_COMMUNITY)
Admission: RE | Admit: 2010-08-30 | Discharge: 2010-08-30 | Disposition: A | Discharge status: Home / Self Care | Payer: Medicare Other | Source: Ambulatory Visit | Attending: Vascular Surgery | Admitting: Vascular Surgery

## 2010-08-30 ENCOUNTER — Telehealth: Payer: Self-pay | Admitting: Vascular Surgery

## 2010-08-30 ENCOUNTER — Other Ambulatory Visit: Payer: Self-pay | Admitting: Vascular Surgery

## 2010-08-30 DIAGNOSIS — I720 Aneurysm of carotid artery: Secondary | ICD-10-CM | POA: Insufficient documentation

## 2010-08-30 DIAGNOSIS — Z01811 Encounter for preprocedural respiratory examination: Secondary | ICD-10-CM | POA: Insufficient documentation

## 2010-08-30 DIAGNOSIS — I729 Aneurysm of unspecified site: Secondary | ICD-10-CM

## 2010-08-30 DIAGNOSIS — Z01812 Encounter for preprocedural laboratory examination: Secondary | ICD-10-CM | POA: Insufficient documentation

## 2010-08-30 LAB — COMPREHENSIVE METABOLIC PANEL
ALT: 18 U/L (ref 0–53)
Alkaline Phosphatase: 83 U/L (ref 39–117)
CO2: 32 mEq/L (ref 19–32)
Chloride: 90 mEq/L — ABNORMAL LOW (ref 96–112)
GFR calc Af Amer: >60 mL/min (ref >60)
GFR calc non Af Amer: 53 mL/min — ABNORMAL LOW (ref >60)
Glucose, Bld: 132 mg/dL — ABNORMAL HIGH (ref 70–99)
Potassium: 4.4 mEq/L (ref 3.5–5.1)
Sodium: 130 mEq/L — ABNORMAL LOW (ref 135–145)
Total Protein: 7.1 g/dL (ref 6.0–8.3)

## 2010-08-30 LAB — CBC
Hemoglobin: 15.5 g/dL (ref 13.0–17.0)
MCH: 32.6 pg (ref 26.0–34.0)
MCHC: 36.2 g/dL — ABNORMAL HIGH (ref 30.0–36.0)
MCV: 89.9 fL (ref 78.0–100.0)
Platelets: 244 10*3/uL (ref 150–400)

## 2010-08-30 LAB — URINALYSIS, ROUTINE W REFLEX MICROSCOPIC
Bilirubin Urine: NEGATIVE
Ketones, ur: NEGATIVE mg/dL
Nitrite: NEGATIVE
Urobilinogen, UA: 1 mg/dL (ref 0.0–1.0)

## 2010-08-30 LAB — PROTIME-INR: INR: 0.96 (ref 0.00–1.49)

## 2010-08-30 LAB — APTT: aPTT: 29 seconds (ref 24–37)

## 2010-08-31 ENCOUNTER — Inpatient Hospital Stay (HOSPITAL_COMMUNITY)
Admission: RE | Admit: 2010-08-31 | Discharge: 2010-09-17 | DRG: 252 | Disposition: A | Discharge status: Skilled Nursing Facility | Payer: Medicare Other | Source: Ambulatory Visit | Attending: Vascular Surgery | Admitting: Vascular Surgery

## 2010-08-31 ENCOUNTER — Other Ambulatory Visit: Payer: Self-pay | Admitting: Vascular Surgery

## 2010-08-31 DIAGNOSIS — E785 Hyperlipidemia, unspecified: Secondary | ICD-10-CM | POA: Diagnosis present

## 2010-08-31 DIAGNOSIS — I720 Aneurysm of carotid artery: Principal | ICD-10-CM | POA: Diagnosis present

## 2010-08-31 DIAGNOSIS — I509 Heart failure, unspecified: Secondary | ICD-10-CM | POA: Diagnosis present

## 2010-08-31 DIAGNOSIS — I2510 Atherosclerotic heart disease of native coronary artery without angina pectoris: Secondary | ICD-10-CM | POA: Diagnosis present

## 2010-08-31 DIAGNOSIS — F0280 Dementia in other diseases classified elsewhere without behavioral disturbance: Secondary | ICD-10-CM | POA: Diagnosis present

## 2010-08-31 DIAGNOSIS — I5043 Acute on chronic combined systolic (congestive) and diastolic (congestive) heart failure: Secondary | ICD-10-CM | POA: Diagnosis present

## 2010-08-31 DIAGNOSIS — G309 Alzheimer's disease, unspecified: Secondary | ICD-10-CM | POA: Diagnosis present

## 2010-08-31 DIAGNOSIS — I2789 Other specified pulmonary heart diseases: Secondary | ICD-10-CM | POA: Diagnosis present

## 2010-08-31 DIAGNOSIS — E440 Moderate protein-calorie malnutrition: Secondary | ICD-10-CM | POA: Diagnosis present

## 2010-08-31 DIAGNOSIS — I729 Aneurysm of unspecified site: Secondary | ICD-10-CM

## 2010-08-31 DIAGNOSIS — Z951 Presence of aortocoronary bypass graft: Secondary | ICD-10-CM

## 2010-08-31 DIAGNOSIS — R1313 Dysphagia, pharyngeal phase: Secondary | ICD-10-CM | POA: Diagnosis present

## 2010-08-31 DIAGNOSIS — I10 Essential (primary) hypertension: Secondary | ICD-10-CM | POA: Diagnosis present

## 2010-08-31 DIAGNOSIS — I4891 Unspecified atrial fibrillation: Secondary | ICD-10-CM | POA: Diagnosis present

## 2010-08-31 DIAGNOSIS — I359 Nonrheumatic aortic valve disorder, unspecified: Secondary | ICD-10-CM | POA: Diagnosis present

## 2010-08-31 DIAGNOSIS — R29810 Facial weakness: Secondary | ICD-10-CM | POA: Diagnosis present

## 2010-08-31 LAB — TYPE AND SCREEN: Antibody Screen: NEGATIVE

## 2010-09-01 ENCOUNTER — Inpatient Hospital Stay (HOSPITAL_COMMUNITY): Payer: Medicare Other

## 2010-09-01 LAB — CBC
MCV: 87.9 fL (ref 78.0–100.0)
Platelets: 250 10*3/uL (ref 150–400)
RBC: 3.96 MIL/uL — ABNORMAL LOW (ref 4.22–5.81)
WBC: 17.8 10*3/uL — ABNORMAL HIGH (ref 4.0–10.5)

## 2010-09-01 LAB — BASIC METABOLIC PANEL
BUN: 11 mg/dL (ref 6–23)
CO2: 29 mEq/L (ref 19–32)
Chloride: 93 mEq/L — ABNORMAL LOW (ref 96–112)
GFR calc Af Amer: >60 mL/min (ref >60)
Glucose, Bld: 123 mg/dL — ABNORMAL HIGH (ref 70–99)
Potassium: 5.2 mEq/L — ABNORMAL HIGH (ref 3.5–5.1)

## 2010-09-02 ENCOUNTER — Inpatient Hospital Stay (HOSPITAL_COMMUNITY): Payer: Medicare Other

## 2010-09-02 LAB — WOUND CULTURE: Culture: NO GROWTH

## 2010-09-02 LAB — GLUCOSE, CAPILLARY

## 2010-09-04 ENCOUNTER — Inpatient Hospital Stay (HOSPITAL_COMMUNITY): Payer: Medicare Other

## 2010-09-05 ENCOUNTER — Inpatient Hospital Stay (HOSPITAL_COMMUNITY): Payer: Medicare Other

## 2010-09-05 NOTE — Op Note (Signed)
NAME:  [name], [name] NO.:  [id number]  MEDICAL RECORD NO.:  [id number]  LOCATION:  2550                         FACILITY:  [facility]  PHYSICIAN:  Quita Skye. Hart Rochester, M.D.  DATE OF BIRTH:  January 21, 1925  DATE OF PROCEDURE:  08/31/2010 DATE OF DISCHARGE:                              OPERATIVE REPORT   PREOPERATIVE DIAGNOSIS:  Pseudoaneurysm of right carotid endarterectomy site.  POSTOPERATIVE DIAGNOSIS:  Pseudoaneurysm of right carotid endarterectomy site with disruption of Dacron patch.  PROCEDURE:  Repair of pseudoaneurysm, right carotid endarterectomy site with removal of disrupted Dacron patch with culture and replacement with a saphenous vein patch from the left leg.  SURGEON:  Quita Skye. Hart Rochester, MD  FIRST ASSISTANT:  Di Kindle. Edilia Bo, MD  SECOND ASSISTANT:  Newton Pigg, [place]  ANESTHESIA:  General endotracheal.  BRIEF HISTORY:  This patient had a right carotid endarterectomy in 1996 and a redo right carotid endarterectomy in 2004, presents now with pulsatile mass in the right neck which was found to be a pseudoaneurysm at the carotid bifurcation on angiography which appears to be a patch disruption.  He has no evidence of infection of the patch such as fevers, chills or wound changes, and was scheduled for repair of this with vein from the contralateral left leg.  DESCRIPTION OF PROCEDURE:  The patient was taken to the operating room, placed in supine position at which time satisfactory general endotracheal anesthesia was administered.  The right neck and left groin and proximal thigh were prepped Betadine scrub and solution, draped in routine sterile manner.  Incision was made in the left inguinal area. Saphenous vein was removed from the saphenofemoral junction about 10 cm distally.  Branches ligated with 4 and 5-0 silk ties and divided.  It was removed gently, dilated with heparinized saline, marked for orientation purposes of an excellent  vein.  Attention turned to the right neck were incision was made through the previous scar extending it proximally and distally slightly throughout the entire neck along the anterior border of the sternocleidomastoid muscle.  This was carried down through subcutaneous tissue and platysma using the Bovie.  Common carotid artery was dissected free low in the neck being careful to avoid injury to the vagus nerve which was visualized throughout.  Dissection continued proximally up to the bifurcation where there was a prominent localized bulge extending anterolaterally measuring about 2.5-3 cm in diameter which was pulsatile.  Distal control of the internal carotid artery was obtained at this point and the Dacron patch which had been sewn in place previously was noted and extended even further distally than I could expose.  Hypoglossal nerve was carefully dissected away from the patch avoiding injury.  At this point, the pseudoaneurysm was carefully dissected free and the artery was rotated medially, being careful again to avoid injury to the vagus nerve.  External carotid artery was controlled from the external approach without completely dissecting it free circumferentially.  The patient was then heparinized and the common carotid artery was occluded proximally and internal and external distally.  Longitudinal opening made in the common carotid artery with 15 blade, extended with Potts scissors up through the patch. This was then extended through  the area where the pulsatile mass was located, and it was then obvious that the patch had disrupted from the medial wall over distance of about 3 cm where the external carotid was located.  There was some soft liquefied thrombus in this area, which did not appear to be purulent, but was cultured and the patch was also sent for culture.  The arteriotomy extended distally up to the distal portion of the patch.  A #10 shunt was inserted without  difficulty reestablishing flow in about 3 minutes.  There was excellent pulsatile flow coming from the distal vessel.  There was no evidence of any severe atherosclerotic disease in the endarterectomized vessel.  No thrombus was noted.  It was decided the safest plan would be to excise the previous patch in places for the vein patch rather than resect the vessel completely.  Therefore the entire patch was excised up to the very distal tip and all sent for culture.  The saphenous vein was opened longitudinally, sewn in as a patch with 5-0 Prolene.  Prior to completion of closure, the shunt was removed after about 40 minutes shunt time following antegrade and retrograde flushing.  Closure was completed reestablishing flow initially up the external then the internal branch.  The carotid was occluded for less than 2 minutes after removal of the shunt.  Protamine was then given to reverse the heparin. Following adequate hemostasis, wounds were irrigated with saline, closed in layers with Vicryl in subcuticular fashion leaving a Jackson-Pratt drain in the depth of the wound brought out inferiorly through a stab wound.  The thigh wound was closed in layers with Vicryl in subcuticular fashion with Dermabond.  The patient taken to recovery in stable condition.     Quita Skye Hart Rochester, M.D.     JDL/MEDQ  D:  08/31/2010  T:  08/31/2010  Job:  [id number]  Electronically Signed by Josephina Gip M.D. on 09/05/2010 09:43:55 AM

## 2010-09-07 ENCOUNTER — Inpatient Hospital Stay (HOSPITAL_COMMUNITY): Payer: Medicare Other

## 2010-09-07 MED ORDER — IOHEXOL 300 MG/ML  SOLN
100.0000 mL | Freq: Once | INTRAMUSCULAR | Status: AC | PRN
  Administered 2010-09-07: 10 mL

## 2010-09-09 DIAGNOSIS — I4891 Unspecified atrial fibrillation: Secondary | ICD-10-CM

## 2010-09-09 LAB — CBC
HCT: 30 % — ABNORMAL LOW (ref 39.0–52.0)
Hemoglobin: 10.2 g/dL — ABNORMAL LOW (ref 13.0–17.0)
RBC: 3.29 MIL/uL — ABNORMAL LOW (ref 4.22–5.81)
RDW: 14 % (ref 11.5–15.5)
WBC: 9.3 10*3/uL (ref 4.0–10.5)

## 2010-09-09 LAB — BASIC METABOLIC PANEL
BUN: 13 mg/dL (ref 6–23)
CO2: 30 mEq/L (ref 19–32)
Chloride: 101 mEq/L (ref 96–112)
GFR calc Af Amer: >60 mL/min (ref >60)
Potassium: 3.7 mEq/L (ref 3.5–5.1)

## 2010-09-10 ENCOUNTER — Inpatient Hospital Stay (HOSPITAL_COMMUNITY): Payer: Medicare Other

## 2010-09-10 DIAGNOSIS — R1310 Dysphagia, unspecified: Secondary | ICD-10-CM

## 2010-09-11 ENCOUNTER — Inpatient Hospital Stay (HOSPITAL_COMMUNITY): Payer: Medicare Other

## 2010-09-11 ENCOUNTER — Ambulatory Visit: Payer: Medicare Other | Admitting: Vascular Surgery

## 2010-09-11 LAB — BASIC METABOLIC PANEL
CO2: 32 mEq/L (ref 19–32)
Chloride: 97 mEq/L (ref 96–112)
Creatinine, Ser: 0.76 mg/dL (ref 0.50–1.35)

## 2010-09-11 LAB — PRO B NATRIURETIC PEPTIDE
Pro B Natriuretic peptide (BNP): 3752 pg/mL — ABNORMAL HIGH (ref 0–450)
Pro B Natriuretic peptide (BNP): 4373 pg/mL — ABNORMAL HIGH (ref 0–450)

## 2010-09-11 LAB — POTASSIUM: Potassium: 4.1 mEq/L (ref 3.5–5.1)

## 2010-09-12 DIAGNOSIS — I059 Rheumatic mitral valve disease, unspecified: Secondary | ICD-10-CM

## 2010-09-12 LAB — COMPREHENSIVE METABOLIC PANEL
Albumin: 2.9 g/dL — ABNORMAL LOW (ref 3.5–5.2)
BUN: 19 mg/dL (ref 6–23)
Creatinine, Ser: 0.84 mg/dL (ref 0.50–1.35)
GFR calc Af Amer: >60 mL/min (ref >60)
Total Protein: 6.3 g/dL (ref 6.0–8.3)

## 2010-09-13 LAB — BASIC METABOLIC PANEL
CO2: 34 mEq/L — ABNORMAL HIGH (ref 19–32)
GFR calc non Af Amer: >60 mL/min (ref >60)
Glucose, Bld: 115 mg/dL — ABNORMAL HIGH (ref 70–99)
Potassium: 4 mEq/L (ref 3.5–5.1)
Sodium: 132 mEq/L — ABNORMAL LOW (ref 135–145)

## 2010-09-13 LAB — PRO B NATRIURETIC PEPTIDE: Pro B Natriuretic peptide (BNP): 2282 pg/mL — ABNORMAL HIGH (ref 0–450)

## 2010-09-13 NOTE — Consult Note (Signed)
NAMEMarland Alvarado  RASEAN, JOOS NO.:  [id number]  MEDICAL RECORD NO.:  [id number]  LOCATION:  2039                         FACILITY:  [facility]  PHYSICIAN:  Armanda Magic, M.D.     DATE OF BIRTH:  1924/06/27  DATE OF CONSULTATION:  09/08/2010 DATE OF DISCHARGE:                                CONSULTATION   PRIMARY HEART DOCTOR:  Rollene Rotunda, MD, [facility].  CHIEF COMPLAINT:  Atrial fibrillation.  HISTORY OF PRESENT ILLNESS:  This is an 77 year old male with a history of carotid artery stenosis, status post bilateral carotid endarterectomy in 1986, coronary artery disease status post CABG in 1997, hypertension, and newly diagnosed atrial fibrillation by Dr. Jenene Slicker initial preop consultation in July.  He was found recently to have a right carotid artery pseudoaneurysm and was evaluated by Dr. Antoine Poche for preoperative clearance.  He subsequently underwent repair of the pseudoaneurysm of the right carotid endarterectomy site with removal of Dacron patch and placement of saphenous vein graft.  Postop, he had some swallowing problems and a Panda tube was placed, which is still in place.  He is pending possible PEG tube placement next week.  Today, his heart rate was noted to be increased with atrial fibrillation on ambulation.  We are now asked to consult.  Of note, per Dr. Jenene Slicker note, this atrial fibrillation appears new and Dr. Antoine Poche had recommended prior to the patient's discharge from the hospital for the surgery that he placed on Coumadin when felt safe by Vascular Surgery.  PAST MEDICAL HISTORY:  Includes, carotid artery stenosis, status post bilateral carotid endarterectomy in 1996 with redo in 2004, coronary artery disease status post CABG in 1997, hypertension, dyslipidemia, anxiety.  SOCIAL HISTORY:  He is widowed with five children.  He quit tobacco in 1965.  He denies any alcohol use.  PAST SURGICAL HISTORY:  Bilateral carotid endarterectomy in 1996  with redo in 2004, also CABG in 1997.  FAMILY HISTORY:  His mother died of gangrene.  His father died of heart failure at 17.  CURRENT MEDICATIONS: 1. Aspirin 300 mg per rectum daily. 2. Crestor 20 mg per Panda two at bedtime. 3. Sertraline 75 mg daily. 4. Namenda 10 mg b.i.d. 5. Hydrochlorothiazide 12.5 mg daily. 6. Allopurinol 100 mg daily. 7. Metoprolol 50 mg b.i.d. 8. Xanax 0.25 mg b.i.d.  REVIEW OF SYSTEMS:  Other than what was stated in the HPI, is negative.  PHYSICAL EXAM:  VITAL SIGNS:  Blood pressure is 128/79, heart rate 83. GENERAL:  This is a well-developed, well-nourished male, in no distress. HEENT:  Benign. NECK:  Supple without lymphadenopathy.  Carotid upstrokes are +2 bilaterally. NECK:  Supple with no bruits. LUNGS:  Clear to auscultation anteriorly. HEART:  Irregularly irregular.  No murmurs, rubs, or gallops. ABDOMEN:  Soft. EXTREMITIES:  +1 edema bilaterally.  LABS:  Sodium 130, potassium 5.2, chloride 93, bicarb 29, BUN 11, creatinine 0.96.  White cell count 12.8, hematocrit 34.8, hemoglobin 12.5, platelet count 250.  EKG shows atrial fibrillation with occasional rapid ventricular response when ambulating.  ASSESSMENT: 1. Atrial fibrillation with increased heart rate with ambulation     apparently in office visit with Dr. Antoine Poche on July 13, stated  that this was new. 2. Coronary artery disease status post remote coronary artery bypass     graft. 3. Carotid stenosis status post bilateral carotid endarterectomy     remotely with repair of pseudoaneurysm in the right carotid. 4. Hypertension.  PLAN:  Increase Lopressor to 75 mg b.i.d. for better rate control.  We will need to be put on Coumadin prior to discharge per Dr. Jenene Slicker preoperative clearance note, start date per Vascular Surgery when they feel he is stable from a surgical standpoint to tolerate anticoagulation.     Armanda Magic, M.D.     TT/MEDQ  D:  09/08/2010  T:   09/08/2010  Job:  [id number]  cc:   Rollene Rotunda, MD, [facility]  Electronically Signed by Armanda Magic M.D. on 09/13/2010 07:40:47 PM

## 2010-09-14 LAB — BASIC METABOLIC PANEL
CO2: 35 mEq/L — ABNORMAL HIGH (ref 19–32)
GFR calc non Af Amer: >60 mL/min (ref >60)
Glucose, Bld: 103 mg/dL — ABNORMAL HIGH (ref 70–99)
Potassium: 4.3 mEq/L (ref 3.5–5.1)
Sodium: 131 mEq/L — ABNORMAL LOW (ref 135–145)

## 2010-09-16 ENCOUNTER — Inpatient Hospital Stay (HOSPITAL_COMMUNITY): Payer: Medicare Other

## 2010-09-17 LAB — PROTIME-INR
INR: 1.19 (ref 0.00–1.49)
Prothrombin Time: 15.4 seconds — ABNORMAL HIGH (ref 11.6–15.2)

## 2010-09-18 NOTE — Discharge Summary (Addendum)
NAME:  [name], [name] NO.:  [id number]  MEDICAL RECORD NO.:  [id number]  LOCATION:  2039                         FACILITY:  [facility]  PHYSICIAN:  Quita Skye. Hart Rochester, M.D.  DATE OF BIRTH:  Feb 28, 1924  DATE OF ADMISSION:  08/31/2010 DATE OF DISCHARGE:  09/17/2010                              DISCHARGE SUMMARY   Mr. [name] is an 77 year old gentleman who had undergone a redo right carotid endarterectomy with Dacron patch angioplasty with Dr. Hart Rochester in February 2005.  He was found on subsequent exams to have pseudoaneurysm at the patch.  He had an angiogram on July 24, 2010 which confirmed a right carotid pseudoaneurysm and he was admitted to the hospital on July 27th for repair.  PAST MEDICAL HISTORY: 1. Coronary artery disease. 2. Hypertension. 3. Hyperlipidemia. 4. Gout. 5. Atrial fib. 6. He also has a history of bilateral carotid endarterectomies in the     fall of 1996 by Dr. Hart Rochester and then a redo of the right carotid     endarterectomy in February 2004.  HOSPITAL COURSE:  The patient was taken to the operating room on August 31, 2010, by Dr. Hart Rochester for repair of pseudoaneurysm right carotid endarterectomy site with removal of disrupted Dacron patch.  Culture replaced with a saphenous vein patch in the left leg.  Postoperatively, the patient had profound laryngeal nerve dysphagia and he was begun on tube feeds with significant problems with swallowing his secretions.  He was seen by Speech Therapy and had multiple MBSS procedures and it was recommended that he continued to be n.p.o.  He continues to be a risk for aspiration and GI was consulted and a PEG tube was placed for bolus tube feeds.  The patient also had some generalized edema with a BNP of greater than 3700 and he was begun on some diuresis with good effect. He was alert and oriented.  He was breathing more comfortably after diuresis and he was begun on Lasix daily.  His potassium remained  stable and above 3.6, and shortness of breath and dyspnea on exertion improved with diuresis.  He was tolerating his tube feeds.  He was also placed on some Reglan for some gastric with ileus issues, otherwise, his Afib rate was controlled.  He will continue his aspirin.  He had an echocardiogram while in-house which showed an ejection fraction of 45-50% with diffuse hypokinesis of the left ventricle.  He also had some mild aortic stenosis and moderate pulmonary hypertension.  The patient will be discharged to SNF for further rehab and care.  DISCHARGE MEDICATIONS: 1. Tylenol 650 mg per tube every 4 hours as needed for pain and fever. 2. Allopurinol 100 mg daily per tube. 3. Xanax 0.25 mg b.i.d. per tube. 4. Aspirin 325 mg daily per tube. 5. Lasix 40 mg daily per tube. 6. Jevity tube feeds and bolus. 7. Namenda 10 mg per tube twice daily. 8. Metoprolol 75 mg per tube twice daily. 9. Reglan 10 mg per tube t.i.d. before bolus feedings and at bedtime. 10.Crestor 20 mg daily at bedtime. 11.Sertraline 25 mg daily.  FINAL DIAGNOSIS: 1. Pseudoaneurysm of Dacron patch in the right carotid endarterectomy     site  which was repaired. 2. Postop laryngeal nerve dysphagia with need for a PEG tube for tube     feedings as aspiration risk was still high. 3. Mild congestive heart failure responding to diuretics. 4. Atrial fibrillation, rate is controlled.  He is on full dose     aspirin and will eventually need to be placed on Coumadin.  DISPOSITION:  The patient will be discharged to skilled nursing facility.  He will follow up with Dr. Hart Rochester in 2 weeks.  He will continue to have speech therapy and physical therapy.     Della Goo, PA-C   ______________________________ Quita Skye Hart Rochester, M.D.    RR/MEDQ  D:  09/15/2010  T:  09/15/2010  Job:  [id number]  Electronically Signed by Della Goo PA on 09/18/2010 10:07:05 AM Electronically Signed by Josephina Gip M.D. on 10/04/2010  09:19:49 AM

## 2010-09-21 ENCOUNTER — Emergency Department (HOSPITAL_COMMUNITY): Payer: Medicare Other

## 2010-09-21 ENCOUNTER — Inpatient Hospital Stay (HOSPITAL_COMMUNITY)
Admission: EM | Admit: 2010-09-21 | Discharge: 2010-09-25 | DRG: 392 | Disposition: A | Discharge status: Home Health | Payer: Medicare Other | Attending: Internal Medicine | Admitting: Internal Medicine

## 2010-09-21 DIAGNOSIS — I4891 Unspecified atrial fibrillation: Secondary | ICD-10-CM | POA: Diagnosis present

## 2010-09-21 DIAGNOSIS — Z87891 Personal history of nicotine dependence: Secondary | ICD-10-CM

## 2010-09-21 DIAGNOSIS — Z951 Presence of aortocoronary bypass graft: Secondary | ICD-10-CM

## 2010-09-21 DIAGNOSIS — K2970 Gastritis, unspecified, without bleeding: Principal | ICD-10-CM | POA: Diagnosis present

## 2010-09-21 DIAGNOSIS — F0280 Dementia in other diseases classified elsewhere without behavioral disturbance: Secondary | ICD-10-CM | POA: Diagnosis present

## 2010-09-21 DIAGNOSIS — R1310 Dysphagia, unspecified: Secondary | ICD-10-CM | POA: Diagnosis present

## 2010-09-21 DIAGNOSIS — I10 Essential (primary) hypertension: Secondary | ICD-10-CM | POA: Diagnosis present

## 2010-09-21 DIAGNOSIS — Z6831 Body mass index (BMI) 31.0-31.9, adult: Secondary | ICD-10-CM

## 2010-09-21 DIAGNOSIS — Z7901 Long term (current) use of anticoagulants: Secondary | ICD-10-CM

## 2010-09-21 DIAGNOSIS — Z931 Gastrostomy status: Secondary | ICD-10-CM

## 2010-09-21 DIAGNOSIS — E785 Hyperlipidemia, unspecified: Secondary | ICD-10-CM | POA: Diagnosis present

## 2010-09-21 DIAGNOSIS — G309 Alzheimer's disease, unspecified: Secondary | ICD-10-CM | POA: Diagnosis present

## 2010-09-21 DIAGNOSIS — K2990 Gastroduodenitis, unspecified, without bleeding: Principal | ICD-10-CM | POA: Diagnosis present

## 2010-09-21 DIAGNOSIS — I2510 Atherosclerotic heart disease of native coronary artery without angina pectoris: Secondary | ICD-10-CM | POA: Diagnosis present

## 2010-09-21 DIAGNOSIS — J3800 Paralysis of vocal cords and larynx, unspecified: Secondary | ICD-10-CM | POA: Diagnosis present

## 2010-09-21 DIAGNOSIS — M109 Gout, unspecified: Secondary | ICD-10-CM | POA: Diagnosis present

## 2010-09-21 DIAGNOSIS — Z79899 Other long term (current) drug therapy: Secondary | ICD-10-CM

## 2010-09-21 DIAGNOSIS — E440 Moderate protein-calorie malnutrition: Secondary | ICD-10-CM | POA: Diagnosis present

## 2010-09-21 DIAGNOSIS — Z66 Do not resuscitate: Secondary | ICD-10-CM | POA: Diagnosis present

## 2010-09-21 LAB — COMPREHENSIVE METABOLIC PANEL
AST: 17 U/L (ref 0–37)
Albumin: 3.4 g/dL — ABNORMAL LOW (ref 3.5–5.2)
Alkaline Phosphatase: 88 U/L (ref 39–117)
Chloride: 92 mEq/L — ABNORMAL LOW (ref 96–112)
Potassium: 3.7 mEq/L (ref 3.5–5.1)
Sodium: 134 mEq/L — ABNORMAL LOW (ref 135–145)
Total Bilirubin: 0.5 mg/dL (ref 0.3–1.2)
Total Protein: 7 g/dL (ref 6.0–8.3)

## 2010-09-21 LAB — CBC
MCV: 92.1 fL (ref 78.0–100.0)
Platelets: 424 10*3/uL — ABNORMAL HIGH (ref 150–400)
RBC: 3.8 MIL/uL — ABNORMAL LOW (ref 4.22–5.81)
RDW: 13.8 % (ref 11.5–15.5)
WBC: 11.3 10*3/uL — ABNORMAL HIGH (ref 4.0–10.5)

## 2010-09-21 LAB — URINALYSIS, ROUTINE W REFLEX MICROSCOPIC
Leukocytes, UA: NEGATIVE
Nitrite: NEGATIVE
Specific Gravity, Urine: 1.011 (ref 1.005–1.030)
Urobilinogen, UA: 1 mg/dL (ref 0.0–1.0)
pH: 8.5 — ABNORMAL HIGH (ref 5.0–8.0)

## 2010-09-21 LAB — DIFFERENTIAL
Basophils Absolute: 0 10*3/uL (ref 0.0–0.1)
Eosinophils Absolute: 0.4 10*3/uL (ref 0.0–0.7)
Eosinophils Relative: 3 % (ref 0–5)
Lymphs Abs: 2 10*3/uL (ref 0.7–4.0)
Neutrophils Relative %: 69 % (ref 43–77)

## 2010-09-22 LAB — CBC
HCT: 33 % — ABNORMAL LOW (ref 39.0–52.0)
MCH: 30 pg (ref 26.0–34.0)
MCV: 92.4 fL (ref 78.0–100.0)
Platelets: 374 10*3/uL (ref 150–400)
RDW: 13.8 % (ref 11.5–15.5)
WBC: 9.5 10*3/uL (ref 4.0–10.5)

## 2010-09-22 LAB — BASIC METABOLIC PANEL
BUN: 16 mg/dL (ref 6–23)
CO2: 33 mEq/L — ABNORMAL HIGH (ref 19–32)
Calcium: 9.2 mg/dL (ref 8.4–10.5)
GFR calc non Af Amer: >60 mL/min (ref >60)
Glucose, Bld: 103 mg/dL — ABNORMAL HIGH (ref 70–99)
Potassium: 3.8 mEq/L (ref 3.5–5.1)
Sodium: 136 mEq/L (ref 135–145)

## 2010-09-22 NOTE — H&P (Signed)
NAME:  [name], NICOLINI NO.:  [id number]  MEDICAL RECORD NO.:  [id number]  LOCATION:  [facility]                         FACILITY:  [facility]  PHYSICIAN:  Della Goo, M.D. DATE OF BIRTH:  1924-06-15  DATE OF ADMISSION:  09/21/2010 DATE OF DISCHARGE:                             HISTORY & PHYSICAL   PRIMARY CARE PHYSICIAN:  [facility].  CURRENT ATTENDING:  Dr. Allena Katz, [facility].  CHIEF COMPLAINT:  Nausea, vomiting.  HISTORY OF PRESENT ILLNESS:  This is an 77 year old male with Alzheimer's disease who has severe dysphagia following a complex carotid artery surgery where he suffered nerve damage who has a PEG tube and was admitted to [facility] nursing home facility for continued swallowing and speech rehab.  The patient was sent from the [facility] for further evaluation secondary to continued severe vomiting for the past 2 days.  The patient has been vomiting up yellowish bilious fluids and has not been tolerating feedings or medications per his PEG tube per report of the family.  Per the family, the patient has not had any chest pain or shortness of breath.  The patient has severe Alzheimer's and is unable to give a history.  PAST MEDICAL HISTORY:  As mentioned above.  Also coronary artery disease, hypertension and gout and dyslipidemia and Alzheimer's dementia status post coronary artery bypass grafting x4 vessels cholecystectomy, carotid endarterectomies 3 on the right, last carotid artery repair with complications and 1 left-sided carotid endarterectomy.  MEDICATIONS:  Allopurinol, aspirin, Coumadin, Lasix, metoprolol tartrate Namenda, Reglan, sertraline, Xanax.  ALLERGIES:  ALTACE, AMBIEN, ATIVAN, NAPROSYN, NIACIN, SULFA.  SOCIAL HISTORY:  The patient is married.  He previously lived at home with his family but went to the [facility] following the hospitalization for surgery.  He has been in [employer] for the past 8 days.  He  has remote history of smoking.  No history of alcohol or illicit drug usage.  FAMILY HISTORY:  Noncontributory.  REVIEW OF SYSTEMS:  Pertinent as mentioned above.  PHYSICAL EXAMINATION FINDINGS:  GENERAL:  This is an elderly 77 year old obese male who is somnolent but arousable.  He is in no acute distress. VITAL SIGNS:  Temperature 98.5, blood pressure 142/72, heart rate 87, respirations 18, O2 sats initially 90%. HEENT:  Normocephalic, atraumatic.  Pupils equally round and reactive to light.  Extraocular movements are intact.  Funduscopic benign.  There is no scleral icterus.  Nares are patent bilaterally.  Oropharynx clear. NECK:  Supple.  No thyromegaly, adenopathy, or jugular venous distention. CARDIOVASCULAR:  Regular rate and rhythm.  No murmurs, gallops, or rubs. LUNGS:  Clear to auscultation bilaterally.  No rales, rhonchi, or wheezes. ABDOMEN:  PEG tube clamped and secured.  Site is clean.  Positive bowel sounds but decreased, soft, mildly distended, nontender, no hepatosplenomegaly. EXTREMITIES:  Without cyanosis, clubbing, or edema. NEUROLOGIC:  The patient has generalized weakness but otherwise is able to move all 4 of his extremities.  LABORATORY STUDIES:  White blood cell count 11.3, hemoglobin 11.7, hematocrit 35.0, MCV 92.1, platelets 424,000, neutrophils 69%, lymphocytes 18%.  Urinalysis negative.  Urine specific gravity 1.011, sodium 134, potassium 3.7, chloride 92, CO2 of 38, BUN 19,  creatinine 0.90, and glucose 107.  Acute abdominal series reveals normal heart size status post coronary artery bypass grafting, gastrostomy tube in left upper quadrant.  Normal bowel gas pattern.  No bowel dilatation or bowel wall thickening.  No free air.  Lumbar disk disease changes seen.  ASSESSMENT:  A 77 year old male being admitted with: 1. Nausea and vomiting. 2. Dysphagia secondary to nerve damage. 3. Hypertension. 4. Hypoxia. 5. Coronary artery disease. 6.  Atherosclerotic cardiovascular disease. 7. Alzheimer's dementia. 8. Malnutrition.  PLAN:  The patient will be admitted.  He will continue to be n.p.o.  IV fluids have been ordered for maintenance therapy.  Antiemetic therapy has also been ordered.  PEG tube care has also been ordered.  [facility] will be notified of the patient's admission for further evaluation of the PEG tube for PEG tube function.  Case management will also be consulted for evaluation.  At this particular time, the patient's family does not wish for him to return to [facility].  They wish for him to be discharged to home with resources to care for him at home if at all possible.  Code status is a do not resuscitate.     Della Goo, M.D.     HJ/MEDQ  D:  09/22/2010  T:  09/22/2010  Job:  [id number]  Electronically Signed by Della Goo M.D. on 09/22/2010 04:51:39 AM

## 2010-09-23 LAB — BASIC METABOLIC PANEL
BUN: 15 mg/dL (ref 6–23)
CO2: 31 mEq/L (ref 19–32)
Calcium: 9.4 mg/dL (ref 8.4–10.5)
Chloride: 98 mEq/L (ref 96–112)
Creatinine, Ser: 0.86 mg/dL (ref 0.50–1.35)

## 2010-09-23 LAB — CBC
HCT: 34.6 % — ABNORMAL LOW (ref 39.0–52.0)
MCH: 30.6 pg (ref 26.0–34.0)
MCV: 92.8 fL (ref 78.0–100.0)
RBC: 3.73 MIL/uL — ABNORMAL LOW (ref 4.22–5.81)
WBC: 9.3 10*3/uL (ref 4.0–10.5)

## 2010-09-24 LAB — GLUCOSE, CAPILLARY: Glucose-Capillary: 102 mg/dL — ABNORMAL HIGH (ref 70–99)

## 2010-09-24 LAB — BASIC METABOLIC PANEL
Chloride: 99 mEq/L (ref 96–112)
GFR calc Af Amer: >60 mL/min (ref >60)
GFR calc non Af Amer: >60 mL/min (ref >60)
Potassium: 3.8 mEq/L (ref 3.5–5.1)
Sodium: 135 mEq/L (ref 135–145)

## 2010-09-24 LAB — CBC
Hemoglobin: 11.2 g/dL — ABNORMAL LOW (ref 13.0–17.0)
MCHC: 32.5 g/dL (ref 30.0–36.0)
RBC: 3.77 MIL/uL — ABNORMAL LOW (ref 4.22–5.81)
WBC: 8.6 10*3/uL (ref 4.0–10.5)

## 2010-09-24 LAB — PROTIME-INR: INR: 5.37 (ref 0.00–1.49)

## 2010-09-25 LAB — PROTIME-INR
INR: 3.73 — ABNORMAL HIGH (ref 0.00–1.49)
Prothrombin Time: 37.5 seconds — ABNORMAL HIGH (ref 11.6–15.2)

## 2010-09-26 ENCOUNTER — Encounter: Payer: Self-pay | Admitting: Family Medicine

## 2010-09-26 ENCOUNTER — Telehealth: Payer: Self-pay | Admitting: *Deleted

## 2010-09-26 DIAGNOSIS — R1310 Dysphagia, unspecified: Secondary | ICD-10-CM | POA: Insufficient documentation

## 2010-09-26 NOTE — Telephone Encounter (Signed)
Need discharge summary as I know nothing about any of this. Presume Lasix takes p[lace of HCTZ, stop HCTZ. Presume Crestor takes place of Simvastatin. Stop Simva. Again, need discharge summary.

## 2010-09-26 NOTE — Telephone Encounter (Signed)
[name] with [facility] called with questions regarding meds pt was discharged from hospital with.  He will also need new scripts [facility].  Meds are- furosemide 20 mg's one a day- is pt to stop HCTZ?, xanax .25 mg's, one bid, metocloprimide 5 mg/ml- to have 10 ml's before meals and at bedtime, rosuvastatin 20 mg's daily at bedtime- and does this replace simvastatin?  Please advise.

## 2010-09-26 NOTE — Telephone Encounter (Signed)
Left message on voicemail for nurse at [facility] to call back.

## 2010-09-26 NOTE — Telephone Encounter (Addendum)
Was able to pull discharge summary from e-chart. Prescriptions were called in for Furosemide 20mg , take one every day via peg, Metoclopramide HCL 5mg /8ml liquid, 10 ml via peg 4 x daily as needed before meals and bedtime per Dr. Para March to [facility]. Was advised that Metoprolol can be crushed per pharmacist. Sherron Monday to patient's son and appointment was scheduled for tomorrow 09/27/10 at 4:15 with Dr. Para March. Left detailed message for Toniann Fail that this has been taken care of and that patient is coming in tomorrow for an appointment.

## 2010-09-26 NOTE — Telephone Encounter (Signed)
Noted  

## 2010-09-26 NOTE — Telephone Encounter (Signed)
Toniann Fail at [facility] notified that Dr. Hetty Ely would need a discharge summary in order to know what changes were made when the patient was discharged from the hospital. Toniann Fail was informed to contact the discharging physician for an order for the medications that were changed when the patient left the hospital.  Dr. Hetty Ely will review the discharge summary when available.

## 2010-09-27 ENCOUNTER — Other Ambulatory Visit (HOSPITAL_COMMUNITY): Payer: Self-pay | Admitting: Family Medicine

## 2010-09-27 ENCOUNTER — Encounter: Payer: Self-pay | Admitting: Family Medicine

## 2010-09-27 ENCOUNTER — Ambulatory Visit (INDEPENDENT_AMBULATORY_CARE_PROVIDER_SITE_OTHER): Payer: Medicare Other | Admitting: Family Medicine

## 2010-09-27 ENCOUNTER — Telehealth: Payer: Self-pay | Admitting: *Deleted

## 2010-09-27 DIAGNOSIS — I4891 Unspecified atrial fibrillation: Secondary | ICD-10-CM | POA: Insufficient documentation

## 2010-09-27 DIAGNOSIS — F068 Other specified mental disorders due to known physiological condition: Secondary | ICD-10-CM

## 2010-09-27 DIAGNOSIS — R1310 Dysphagia, unspecified: Secondary | ICD-10-CM

## 2010-09-27 MED ORDER — FUROSEMIDE 40 MG PO TABS: 40.0000 mg | ORAL_TABLET | Freq: Every day | ORAL | Status: DC

## 2010-09-27 MED ORDER — WARFARIN SODIUM 5 MG PO TABS: 2.5000 mg | ORAL_TABLET | Freq: Every day | ORAL | Status: DC

## 2010-09-27 MED ORDER — METOCLOPRAMIDE HCL 5 MG/5ML PO SOLN: 10.0000 mg | Freq: Three times a day (TID) | ORAL | Status: DC

## 2010-09-27 MED ORDER — ROSUVASTATIN CALCIUM 20 MG PO TABS: 20.0000 mg | ORAL_TABLET | Freq: Every day | ORAL | Status: DC

## 2010-09-27 MED ORDER — SERTRALINE HCL 50 MG PO TABS: 75.0000 mg | ORAL_TABLET | Freq: Every day | ORAL | Status: DC

## 2010-09-27 MED ORDER — ALPRAZOLAM 0.25 MG PO TABS: 0.2500 mg | ORAL_TABLET | Freq: Two times a day (BID) | ORAL | Status: DC | PRN

## 2010-09-27 MED ORDER — PANTOPRAZOLE SODIUM 40 MG PO PACK: 40.0000 mg | PACK | Freq: Two times a day (BID) | ORAL | Status: DC

## 2010-09-27 NOTE — Assessment & Plan Note (Signed)
Check INR Monday and take 2.5 mg of coumadin a day in meantime (hold today's dose).  D/w pt and family.

## 2010-09-27 NOTE — Assessment & Plan Note (Signed)
Continue current meds.  Has 1:1 care by family and HH at home.

## 2010-09-27 NOTE — Patient Instructions (Signed)
Measure daily weight in the AM each morning after urinating and before eating/tube feeds.  Call with daily weights and update on swelling early next week. Call if weight changes by 2 lbs in 24hours. Call if tremor or mood changes noted.  Home health to recheck INR on Monday 10/01/10.  OV in 1 month.

## 2010-09-27 NOTE — Telephone Encounter (Signed)
Received call from Ramon Dredge from [facility] with PT/INR results at 12:15 pm, 31.1 and 2.6. Call back for patient is [phone].

## 2010-09-27 NOTE — Progress Notes (Signed)
Now home after hospitalization for vomiting.  Per family- more alert in more familiar setting at home, but memory is still difficult for patient.  Dysphagia and PEG placed after laryngeal nerve palsy.  AF noted earlier this year.  Needs meds evaluated.  Recent labs reviewed. Has been off coumadin for 2 days due to inc in INR.  No bleeding. Available records reviewed.    MBS scheduled for tomorrow.    PMH and SH reviewed  ROS: See HPI, otherwise noncontributory.  Meds, vitals, and allergies reviewed.   Mental status testing:  Oriented to self.  Not oriented to year= 2002. "Ball flag tree"= 0/3 on recall, WORLD- DLO "what was I supposed to do?" nad ncat R neck with healing incision IRR, not tachy ctab PEG intact with soft abd Ext with 1+ edema

## 2010-09-27 NOTE — Telephone Encounter (Signed)
Will address at OV today.  Thanks.

## 2010-09-27 NOTE — Assessment & Plan Note (Signed)
>  60 min spent with face to face with patient, >50% counseling and/or coordinating care.  Will continue with PEG feeds at home with free water boluses.  MBS tomorrow.  Family will call back with update on weight.  See instructions.  They understand the plan.  meds changed to forms available by peg.

## 2010-09-28 ENCOUNTER — Ambulatory Visit (HOSPITAL_COMMUNITY)
Admission: RE | Admit: 2010-09-28 | Discharge: 2010-09-28 | Disposition: A | Discharge status: Home / Self Care | Payer: Medicare Other | Source: Ambulatory Visit | Attending: Family Medicine | Admitting: Family Medicine

## 2010-09-28 ENCOUNTER — Telehealth: Payer: Self-pay | Admitting: Family Medicine

## 2010-09-28 DIAGNOSIS — R1310 Dysphagia, unspecified: Secondary | ICD-10-CM | POA: Insufficient documentation

## 2010-09-28 MED ORDER — PANTOPRAZOLE SODIUM 40 MG PO TBEC: DELAYED_RELEASE_TABLET | ORAL | Status: DC

## 2010-09-28 NOTE — Telephone Encounter (Signed)
Please change meds to PO, not per tube.  Please change protonix back to 40mg  tabs, take bid.  Please call pharmacy and arrange for tablet Metoclopramide 10mg  po before meals and at night (changed from liquid).    They can continue the free water boluses before and after tube feeds until tube feeds are stopped totally.   Decrease the tube feeds by 80ml at a time, ie take tid for 1 week, then 80ml tid for 1 week, then stop.    [name] can replace the deficits (are tube feeds are cut) with oral feeds.  I want to know: Measure daily weight in the AM each morning after urinating and before eating/tube feeds.  Call with daily weights and update on swelling early next week.  Call if weight changes by 2 lbs in 24hours.  Call if tremor or mood changes noted.

## 2010-09-28 NOTE — Telephone Encounter (Signed)
Passed barium swallow test.  He can now have thin liquids and soft foods.  How can they wean him off the tube feedings and how much.  Pharmacist was unable to fill Metoclopramide because of some discrepancy on how it was ordered and what they have available  In house.

## 2010-09-28 NOTE — Telephone Encounter (Signed)
Spoke with Loraine Leriche who came in to the office for answers to the message left on the phone message.

## 2010-09-28 NOTE — Telephone Encounter (Signed)
Also, Kathlene November, pharmacist at [facility] called regarding pt's protonix script.  He says they make a 40 mg that comes in granules that can be mixed with something like applesauce.  Otherwise, if you want liquid, it would have to be compounded at a specialty pharmacy.  Please advise.

## 2010-09-28 NOTE — Telephone Encounter (Signed)
Please call in.  I would use the granules per tube bid.  I couldn't order that in EMR, so I used the free text.  Thanks.

## 2010-09-29 ENCOUNTER — Telehealth: Payer: Self-pay | Admitting: Family Medicine

## 2010-09-29 NOTE — Telephone Encounter (Signed)
Received call from [facility]. They are checking PT/INR's daily.  Reviewed chart.  INR 1.7 today.  There seems to be confusion on dosing.  Per notes, should be taking 1/2 a tab a day. (2.5 mg) Family tells [place] he has been getting 5 mg  Has not been enough time. Would not check INR's daily. It does not seem to me like Dr. Lianne Bushy plan wanted this either.  Change order.  Recheck INR on Wed. Coumadin 2.5 mg daily now.

## 2010-10-01 ENCOUNTER — Telehealth: Payer: Self-pay | Admitting: *Deleted

## 2010-10-01 NOTE — Telephone Encounter (Signed)
Reported to [facility].

## 2010-10-01 NOTE — Telephone Encounter (Signed)
Call report on PT/INR 19.8 and 1.6 per [facility].

## 2010-10-01 NOTE — Telephone Encounter (Signed)
Don't check daily INRs.    Recheck INR on Wed.  Coumadin 2.5 mg daily now.   Please notify home health.  Thanks.

## 2010-10-03 ENCOUNTER — Telehealth: Payer: Self-pay | Admitting: *Deleted

## 2010-10-03 MED ORDER — METOCLOPRAMIDE HCL 5 MG/5ML PO SOLN: 10.0000 mg | Freq: Three times a day (TID) | ORAL | Status: DC

## 2010-10-03 MED ORDER — ALLOPURINOL 100 MG PO TABS: 100.0000 mg | ORAL_TABLET | Freq: Every day | ORAL | Status: DC

## 2010-10-03 MED ORDER — WARFARIN SODIUM 5 MG PO TABS: ORAL_TABLET | ORAL | Status: DC

## 2010-10-03 MED ORDER — PANTOPRAZOLE SODIUM 40 MG PO TBEC: DELAYED_RELEASE_TABLET | ORAL | Status: DC

## 2010-10-03 MED ORDER — MEMANTINE HCL 10 MG PO TABS: 10.0000 mg | ORAL_TABLET | Freq: Two times a day (BID) | ORAL | Status: DC

## 2010-10-03 MED ORDER — ROSUVASTATIN CALCIUM 20 MG PO TABS: 20.0000 mg | ORAL_TABLET | Freq: Every day | ORAL | Status: DC

## 2010-10-03 MED ORDER — METOPROLOL TARTRATE 50 MG PO TABS: 75.0000 mg | ORAL_TABLET | Freq: Two times a day (BID) | ORAL | Status: DC

## 2010-10-03 MED ORDER — FUROSEMIDE 40 MG PO TABS: 40.0000 mg | ORAL_TABLET | Freq: Every day | ORAL | Status: DC

## 2010-10-03 MED ORDER — DOCUSATE SODIUM 100 MG PO CAPS: 100.0000 mg | ORAL_CAPSULE | Freq: Two times a day (BID) | ORAL | Status: DC | PRN

## 2010-10-03 MED ORDER — ALPRAZOLAM 0.25 MG PO TABS: 0.2500 mg | ORAL_TABLET | Freq: Two times a day (BID) | ORAL | Status: DC | PRN

## 2010-10-03 MED ORDER — SERTRALINE HCL 50 MG PO TABS: 75.0000 mg | ORAL_TABLET | Freq: Every day | ORAL | Status: DC

## 2010-10-03 NOTE — Telephone Encounter (Signed)
INR 1.8.    Nurse states patient is eating well orally.

## 2010-10-03 NOTE — Telephone Encounter (Signed)
[name] (son) advised.  LMOVM of Cindy at [facility].

## 2010-10-03 NOTE — Telephone Encounter (Signed)
Change coumadin dose to 5mg  Monday and Thursday, 2.5mg  other days.  Recheck INR 1 week.  Thanks.  Please make sure they are progressing with the taper on the tube feeds as prev directed.  Thanks.

## 2010-10-10 ENCOUNTER — Telehealth: Payer: Self-pay | Admitting: *Deleted

## 2010-10-10 ENCOUNTER — Telehealth: Payer: Self-pay | Admitting: Gastroenterology

## 2010-10-10 MED ORDER — WARFARIN SODIUM 5 MG PO TABS: ORAL_TABLET | ORAL | Status: DC

## 2010-10-10 NOTE — Telephone Encounter (Signed)
I would have them talk with GI (that put it in) about the possible removal of PEG tube.  Even then the feeds are done via tube, continue with flushes as directed until PEG removed.

## 2010-10-10 NOTE — Telephone Encounter (Signed)
Home health nurse called to report that pt is being discharged from nursing and speech therapy because he has met all of his goals.  She is asking when the peg tube can be removed.  Son was told to continue the tube feedings through this week but pt is eating regular foods and doing well with that.  His weight is good.  She will check INR today while she is at the home and will call back with results.

## 2010-10-10 NOTE — Telephone Encounter (Signed)
Change coumadin to 5mg  Monday and 2.5mg  other days.  Recheck INR 1 week. Thanks.  Med list adjusted.

## 2010-10-10 NOTE — Telephone Encounter (Signed)
[name] (son) advised.  They will contact GI for the question about the PEG removal.  Was advised to continue flushes until then.  Appt scheduled for INR in our clinic next week since [facility] will no longer be coming to the home.

## 2010-10-10 NOTE — Telephone Encounter (Signed)
INR 3.6     Home Health will be seeing him for the last visit today.  I see your response from the previous phone call and will relay this message along with your instructions for Coumadin dosage.

## 2010-10-11 NOTE — Telephone Encounter (Signed)
We need to wait at least 6 -8 weeks before we can remove it safely

## 2010-10-11 NOTE — Telephone Encounter (Signed)
Pt had peg tube placed by Dr. Rhea Belton when he was in the hospital. Son states that he is eating and drinking well and his weight is stable. States that when he turned the tube it looked like there may be a little puss coming out around the tube. Son would like to have the tube removed. Pt is on coumadin also. Dr. Arlyce Dice please advise.

## 2010-10-11 NOTE — Telephone Encounter (Signed)
Spoke with pts son and he is aware. States he will call back to have it removed.

## 2010-10-15 ENCOUNTER — Encounter: Payer: Self-pay | Admitting: Vascular Surgery

## 2010-10-16 ENCOUNTER — Ambulatory Visit (INDEPENDENT_AMBULATORY_CARE_PROVIDER_SITE_OTHER): Payer: Medicare Other | Admitting: Vascular Surgery

## 2010-10-16 ENCOUNTER — Encounter: Payer: Self-pay | Admitting: Vascular Surgery

## 2010-10-16 VITALS — BP 144/66 | HR 79 | Temp 97.9°F | Ht 64.0 in | Wt 175.0 lb

## 2010-10-16 DIAGNOSIS — I720 Aneurysm of carotid artery: Secondary | ICD-10-CM

## 2010-10-16 NOTE — Progress Notes (Signed)
Subjective:     Patient ID: [name], male   DOB: 1924-05-20, 77 y.o.   MRN: [id number]  HPI this 77 year old male patient underwent repair of a pseudoaneurysm at the right carotid endarterectomy site with removal of a disrupted background patch and replacement with a saphenous vein patch from the left leg on 08/31/2010. There is no infection present. This procedure was complicated by significant dysphagia requiring a PEG feeding tube and n.p.o. status. This has improved over the past 6 weeks to the point that he has passed a swallowing study and is currently eating solid food without difficulty. He will be having a PEG removed next week by Dr. Arlyce Dice. Denies any paresthesias, aphasia, diplopia, blurred vision, or syncope. He is taking Coumadin. He stated [facility] for about one week and has been at home since that time with home health nurse coverage.   Review of Systems     Objective:   Physical Exam physical exam today blood pressure 144/66 heart rate 79 respirations 16 temperature 97.9 He is alert and oriented x3. He is in no apparent distress. HEENT exam reveals right neck incision to be well-healed. He has 3+ carotid pulse palpable. No bruits are audible. His tongue is in the midline. Neurologic exam is essentially normal.      Assessment:     He has made great progress over the past 6 weeks. He will return in 5 months for carotid duplex exam and see me at that time and continue his Coumadin therapy    Plan:     Return January 20 13th for carotid duplex exam and see Dr. Hart Rochester

## 2010-10-17 ENCOUNTER — Ambulatory Visit (INDEPENDENT_AMBULATORY_CARE_PROVIDER_SITE_OTHER): Payer: Medicare Other | Admitting: Family Medicine

## 2010-10-17 DIAGNOSIS — I720 Aneurysm of carotid artery: Secondary | ICD-10-CM

## 2010-10-17 DIAGNOSIS — Z5181 Encounter for therapeutic drug level monitoring: Secondary | ICD-10-CM

## 2010-10-17 DIAGNOSIS — Z7901 Long term (current) use of anticoagulants: Secondary | ICD-10-CM

## 2010-10-17 LAB — POCT INR: INR: 5.2

## 2010-10-17 NOTE — Patient Instructions (Signed)
Hold coumadin x 2 days then recheck Fri

## 2010-10-19 ENCOUNTER — Ambulatory Visit (INDEPENDENT_AMBULATORY_CARE_PROVIDER_SITE_OTHER): Payer: Medicare Other | Admitting: Family Medicine

## 2010-10-19 DIAGNOSIS — Z5181 Encounter for therapeutic drug level monitoring: Secondary | ICD-10-CM

## 2010-10-19 DIAGNOSIS — Z7901 Long term (current) use of anticoagulants: Secondary | ICD-10-CM

## 2010-10-19 LAB — POCT INR: INR: 4.2

## 2010-10-19 NOTE — Patient Instructions (Signed)
Hold coumadin x 2 days then take 2.5 Sunday, recheck mon

## 2010-10-22 ENCOUNTER — Ambulatory Visit (INDEPENDENT_AMBULATORY_CARE_PROVIDER_SITE_OTHER): Payer: Medicare Other | Admitting: Family Medicine

## 2010-10-22 ENCOUNTER — Telehealth: Payer: Self-pay | Admitting: Gastroenterology

## 2010-10-22 DIAGNOSIS — Z7901 Long term (current) use of anticoagulants: Secondary | ICD-10-CM

## 2010-10-22 DIAGNOSIS — I4891 Unspecified atrial fibrillation: Secondary | ICD-10-CM

## 2010-10-22 DIAGNOSIS — Z5181 Encounter for therapeutic drug level monitoring: Secondary | ICD-10-CM

## 2010-10-22 LAB — POCT INR: INR: 2.5

## 2010-10-22 MED ORDER — WARFARIN SODIUM 2 MG PO TABS: 2.0000 mg | ORAL_TABLET | ORAL | Status: DC

## 2010-10-22 NOTE — Telephone Encounter (Signed)
Pts son is calling and would like to schedule for his father's peg tube to be removed. It has been in 6 weeks today and he is eating and drinking well. Dr. Rhea Belton placed the peg when the pt was in the hospital. Pt is also on Coumadin. When would you like to schedule this removal? Please advise.

## 2010-10-22 NOTE — Patient Instructions (Signed)
Start 2 mg daily recheck in 1 week

## 2010-10-23 NOTE — Telephone Encounter (Signed)
Appt is actually at 3:30pm tomorrow.

## 2010-10-23 NOTE — Telephone Encounter (Signed)
This can be scheduled on an OV

## 2010-10-23 NOTE — Telephone Encounter (Signed)
Pt scheduled for 10/24/10@3pm . Pts son aware of appt date and time.

## 2010-10-24 ENCOUNTER — Ambulatory Visit (INDEPENDENT_AMBULATORY_CARE_PROVIDER_SITE_OTHER): Payer: Medicare Other | Admitting: Gastroenterology

## 2010-10-24 ENCOUNTER — Encounter: Payer: Self-pay | Admitting: Gastroenterology

## 2010-10-24 DIAGNOSIS — R633 Feeding difficulties, unspecified: Secondary | ICD-10-CM

## 2010-10-24 NOTE — Progress Notes (Signed)
[name] has returned to have his gastrostomy tube removed. This was placed on September 13, 2010. He's eating well and no longer has any use for the tube.  On physical exam he is well-developed well-nourished male  Gastrostomy tube site is clear. It was removed percutaneously through traction.

## 2010-10-25 ENCOUNTER — Encounter: Payer: Self-pay | Admitting: Gastroenterology

## 2010-10-29 ENCOUNTER — Ambulatory Visit (INDEPENDENT_AMBULATORY_CARE_PROVIDER_SITE_OTHER): Payer: Medicare Other | Admitting: Family Medicine

## 2010-10-29 DIAGNOSIS — Z5181 Encounter for therapeutic drug level monitoring: Secondary | ICD-10-CM

## 2010-10-29 DIAGNOSIS — I4891 Unspecified atrial fibrillation: Secondary | ICD-10-CM

## 2010-10-29 DIAGNOSIS — Z7901 Long term (current) use of anticoagulants: Secondary | ICD-10-CM

## 2010-10-29 NOTE — Patient Instructions (Signed)
Decrease to 2 mg daily except 1 mg Mon, Thurs, recheck in 1 week

## 2010-11-05 ENCOUNTER — Ambulatory Visit (INDEPENDENT_AMBULATORY_CARE_PROVIDER_SITE_OTHER): Payer: Medicare Other | Admitting: Family Medicine

## 2010-11-05 DIAGNOSIS — Z7901 Long term (current) use of anticoagulants: Secondary | ICD-10-CM

## 2010-11-05 DIAGNOSIS — Z5181 Encounter for therapeutic drug level monitoring: Secondary | ICD-10-CM

## 2010-11-05 DIAGNOSIS — I4891 Unspecified atrial fibrillation: Secondary | ICD-10-CM

## 2010-11-05 LAB — POCT INR: INR: 2.8

## 2010-11-05 NOTE — Patient Instructions (Signed)
2 mg daily except 1 mg Mon, Thurs, recheck in 2 week

## 2010-11-13 LAB — CBC
MCHC: 34.7
Platelets: 277
RDW: 13.2

## 2010-11-13 LAB — I-STAT 8, (EC8 V) (CONVERTED LAB)
Acid-Base Excess: 1
Chloride: 97
Glucose, Bld: 101 — ABNORMAL HIGH
Potassium: 4.1
pCO2, Ven: 49.8
pH, Ven: 7.358 — ABNORMAL HIGH

## 2010-11-13 LAB — DIFFERENTIAL
Basophils Absolute: 0
Basophils Relative: 0
Eosinophils Relative: 3
Lymphocytes Relative: 22
Neutro Abs: 5.1

## 2010-11-13 LAB — POCT CARDIAC MARKERS
CKMB, poc: 3.8
Operator id: 288331
Troponin i, poc: <0.05

## 2010-11-13 LAB — POCT I-STAT CREATININE: Operator id: 288331

## 2010-11-15 NOTE — Discharge Summary (Signed)
NAME:  [name], [name] NO.:  [id number]  MEDICAL RECORD NO.:  [id number]  LOCATION:  5118                         FACILITY:  [facility]  PHYSICIAN:  Zannie Cove, MD     DATE OF BIRTH:  25-May-1924  DATE OF ADMISSION:  09/21/2010 DATE OF DISCHARGE:  09/25/2010                              DISCHARGE SUMMARY   PRIMARY CARE PHYSICIAN:  Arta Silence, MD  VASCULAR SURGEON:  Quita Skye. Hart Rochester, MD  DISCHARGE DIAGNOSES: 1. Nausea, vomiting, likely either gastritis or intolerance to tube     feeds, resolved. 2. Dementia, mild and stable. 3. History of coronary artery disease.     a.     Status post coronary artery bypass graft in 1997. 4. Paroxysmal atrial fibrillation on Coumadin. 5. Status post pseudoaneurysm repair by Dr. Hart Rochester on August 31, 2010. 6. Dysphagia secondary to laryngeal nerve palsy postoperatively status     post percutaneous endoscopic gastrostomy. 7. Moderate protein-calorie malnutrition.  Discharge medications are as follows: 1. Colace 100 mg per tube b.i.d. p.r.n. 2. Jevity 240 mL per tube at the rate of 240 mL per hour via the palm     q.4 hours per Nutrition. 3. Protonix 40 mg per tube b.i.d. 4. Lasix 40 mg daily. 5. Tylenol 60 mg q.4 hours p.r.n. 6. Allopurinol 100 mg daily. 7. Alprazolam 0.25 mg p.o. twice a day as needed. 8. Memantine 10 mg p.o. b.i.d. 9. Reglan 10 mL p.o. q.a.c. at bedtime p.r.n. 10.Metoprolol tartrate 75 mg per tube b.i.d. 11.Rosuvastatin 20 mg per tube daily. 12.Sertraline 25 mg 3 tablets per tube daily. 13.Free water 250 mL t.i.d. and 100 mL bolus after each bolus tube     feeds.  DIAGNOSTICS INVESTIGATIONS:  KUB showed nonspecific bowel gas pattern. Chest x-ray, no acute cardiopulmonary disease.  HOSPITAL COURSE:  Mr. Coate is a very pleasant 77 year old white male who is status post a pseudoaneurysm repair and severe dysphagia as a result of laryngeal nerve palsy presented from [facility] with  severe nausea and vomiting, unable to tolerate tube feeds.  On evaluation, he did not have any acute abdominal process or evidence of acute infection. Subsequently, he was started on IV Protonix.  He had very good clinical response to this and then gently started on the tube feeds with poor nutrition.  RECOMMENDATIONS: 1. He has been able to tolerate his tube feeds being advanced without     any symptoms for about 48 hours now, also continues Reglan p.r.n.     Rest of his chronic medical problems remained stable. 2. Paroxysmal atrial fibrillation.  His INR has been supratherapeutic.     Coumadin is being held.  We will have a repeat INR check on September 27, 2010, per home health RN and then Coumadin can be adjusted     accordingly. 3. Dysphagia status post PEG following to the aneurysm repair and     laryngeal nerve palsies.  Speech Therapy will evaluate the patient     before discharge and followup as well.  DISPOSITION:  The patient is being discharged home per patient and family request with home health, PT, OT, RN,and aide.  Dischargefollowup in  1 week with Dr. Hetty Ely.     Zannie Cove, MD     PJ/MEDQ  D:  09/25/2010  T:  09/25/2010  Job:  [id number]  cc:   Arta Silence, MD  Electronically Signed by Zannie Cove  on 11/15/2010 02:07:56 PM

## 2010-11-19 ENCOUNTER — Ambulatory Visit (INDEPENDENT_AMBULATORY_CARE_PROVIDER_SITE_OTHER): Payer: Medicare Other | Admitting: Family Medicine

## 2010-11-19 ENCOUNTER — Telehealth: Payer: Self-pay | Admitting: *Deleted

## 2010-11-19 DIAGNOSIS — Z7901 Long term (current) use of anticoagulants: Secondary | ICD-10-CM

## 2010-11-19 DIAGNOSIS — Z5181 Encounter for therapeutic drug level monitoring: Secondary | ICD-10-CM

## 2010-11-19 DIAGNOSIS — I4891 Unspecified atrial fibrillation: Secondary | ICD-10-CM

## 2010-11-19 NOTE — Patient Instructions (Signed)
Continue current dose, check in 4 weeks  

## 2010-11-19 NOTE — Telephone Encounter (Signed)
Pt came to the lab today with a male family member who was concerned about pt's chest congestion.  She said another family member had said this morning that pt's cough was different and she was worried that patient may have aspirated something.  Pt stated he was fine.  No fever or other symptoms.  [name] and I advised family member to watch for symptoms of illness, call back if any changes.  She agreed.

## 2010-11-19 NOTE — Telephone Encounter (Signed)
If he has a persistent cough or progression of sx, then I want to see pt back for recheck.  Thanks.

## 2010-11-21 NOTE — Telephone Encounter (Signed)
[name] (son) advised.  Appt is scheduled on Friday.  [name] was advised to call if he needs to come in sooner.

## 2010-11-22 DIAGNOSIS — J988 Other specified respiratory disorders: Secondary | ICD-10-CM

## 2010-11-22 DIAGNOSIS — Z431 Encounter for attention to gastrostomy: Secondary | ICD-10-CM

## 2010-11-22 DIAGNOSIS — J3800 Paralysis of vocal cords and larynx, unspecified: Secondary | ICD-10-CM

## 2010-11-22 DIAGNOSIS — R1310 Dysphagia, unspecified: Secondary | ICD-10-CM

## 2010-11-23 ENCOUNTER — Ambulatory Visit (INDEPENDENT_AMBULATORY_CARE_PROVIDER_SITE_OTHER): Payer: Medicare Other | Admitting: Family Medicine

## 2010-11-23 ENCOUNTER — Encounter: Payer: Self-pay | Admitting: Family Medicine

## 2010-11-23 DIAGNOSIS — R1310 Dysphagia, unspecified: Secondary | ICD-10-CM

## 2010-11-23 NOTE — Patient Instructions (Signed)
I wouldn't change anything now.  Let me know if the symptoms continue.  We could consider rechecking the swallow study in the future.  Take care.

## 2010-11-23 NOTE — Progress Notes (Signed)
Swallowing was going well, he's doing better overall with this.  He passed swallow study and PEG was removed.  He has some saliva that he has to spit out.  Inc in saliva production since the surgery.  Chest congestion noted on Monday and Thursday of this week, not on other days. No fevers.  No sputum, but 'you could hear it' in the chest on Monday/Thursday. Back to regular diet.  No feeling of choking with swallowing.  He feels like he is swallowing well.    Rhinorrhea:yes, present since before the surgery.  congestion:no ear pain: no sore throat:no  ROS: See HPI.  Otherwise negative.    Meds, vitals, and allergies reviewed.   GEN: nad, alert and oriented HEENT: mucous membranes moist, nasal epithelium with clear rhinorrhea, OP without cobblestoning NECK: supple w/o LA CV: rrr PULM: ctab, no inc wob, no cough

## 2010-11-25 ENCOUNTER — Encounter: Payer: Self-pay | Admitting: Family Medicine

## 2010-11-25 NOTE — Assessment & Plan Note (Signed)
Possible source of sx, along with GERD and post nasal gtt.  He appears okay today.  They'll monitor sx.  If progressive or persistent we could repeat swallow eval.  If normal, then consider treatment for postnasal or GERD.  Here with family at OV and they agree; I'll await update from pt/family .

## 2010-12-17 ENCOUNTER — Ambulatory Visit (INDEPENDENT_AMBULATORY_CARE_PROVIDER_SITE_OTHER): Payer: Medicare Other | Admitting: Family Medicine

## 2010-12-17 DIAGNOSIS — Z7901 Long term (current) use of anticoagulants: Secondary | ICD-10-CM

## 2010-12-17 DIAGNOSIS — Z23 Encounter for immunization: Secondary | ICD-10-CM

## 2010-12-17 DIAGNOSIS — I4891 Unspecified atrial fibrillation: Secondary | ICD-10-CM

## 2010-12-17 DIAGNOSIS — Z5181 Encounter for therapeutic drug level monitoring: Secondary | ICD-10-CM

## 2010-12-17 LAB — POCT INR: INR: 2.4

## 2010-12-17 NOTE — Patient Instructions (Signed)
Continue current dose, check in 4 weeks  

## 2010-12-28 ENCOUNTER — Other Ambulatory Visit: Payer: Self-pay | Admitting: Family Medicine

## 2011-01-14 ENCOUNTER — Ambulatory Visit (INDEPENDENT_AMBULATORY_CARE_PROVIDER_SITE_OTHER): Payer: Medicare Other | Admitting: Family Medicine

## 2011-01-14 DIAGNOSIS — Z5181 Encounter for therapeutic drug level monitoring: Secondary | ICD-10-CM

## 2011-01-14 DIAGNOSIS — I4891 Unspecified atrial fibrillation: Secondary | ICD-10-CM

## 2011-01-14 DIAGNOSIS — Z7901 Long term (current) use of anticoagulants: Secondary | ICD-10-CM

## 2011-01-14 LAB — POCT INR: INR: 2.4

## 2011-01-14 NOTE — Patient Instructions (Signed)
Continue current dose, check in 4 weeks  

## 2011-01-25 ENCOUNTER — Other Ambulatory Visit: Payer: Self-pay | Admitting: Family Medicine

## 2011-01-27 ENCOUNTER — Other Ambulatory Visit: Payer: Self-pay | Admitting: Family Medicine

## 2011-01-28 NOTE — Telephone Encounter (Signed)
Pharmacy requesting refill on:  ALPRAZolam (XANAX) 0.25 MG tablet [Pharmacy Med Name: ALPRAZOLAM 0.25 MG TABLET] PLACE 1 TABLET INTO FEEDING TUBE TWICE DAILY AS NEEDED FOR ANXIETY Disp: 60 tablet R: 3 Start: 01/27/2011

## 2011-01-28 NOTE — Telephone Encounter (Signed)
Please call in.  Thanks.   

## 2011-01-28 NOTE — Telephone Encounter (Signed)
Called Rx into pharmacy.  

## 2011-02-11 ENCOUNTER — Ambulatory Visit (INDEPENDENT_AMBULATORY_CARE_PROVIDER_SITE_OTHER): Payer: Medicare Other | Admitting: Family Medicine

## 2011-02-11 DIAGNOSIS — Z5181 Encounter for therapeutic drug level monitoring: Secondary | ICD-10-CM

## 2011-02-11 DIAGNOSIS — Z7901 Long term (current) use of anticoagulants: Secondary | ICD-10-CM

## 2011-02-11 DIAGNOSIS — I4891 Unspecified atrial fibrillation: Secondary | ICD-10-CM

## 2011-02-11 LAB — POCT INR: INR: 2.7

## 2011-02-11 NOTE — Patient Instructions (Signed)
Continue current dose, check in 4 weeks  

## 2011-02-18 ENCOUNTER — Other Ambulatory Visit: Payer: Self-pay | Admitting: Dermatology

## 2011-02-25 DIAGNOSIS — C801 Malignant (primary) neoplasm, unspecified: Secondary | ICD-10-CM

## 2011-02-25 HISTORY — DX: Malignant (primary) neoplasm, unspecified: C80.1

## 2011-03-04 ENCOUNTER — Encounter: Payer: Self-pay | Admitting: Vascular Surgery

## 2011-03-05 ENCOUNTER — Encounter: Payer: Self-pay | Admitting: Vascular Surgery

## 2011-03-05 ENCOUNTER — Ambulatory Visit (INDEPENDENT_AMBULATORY_CARE_PROVIDER_SITE_OTHER): Payer: Medicare Other | Admitting: Vascular Surgery

## 2011-03-05 ENCOUNTER — Other Ambulatory Visit (INDEPENDENT_AMBULATORY_CARE_PROVIDER_SITE_OTHER): Payer: Medicare Other | Admitting: *Deleted

## 2011-03-05 VITALS — BP 134/60 | HR 69 | Resp 16 | Ht 65.0 in | Wt 182.0 lb

## 2011-03-05 DIAGNOSIS — I6529 Occlusion and stenosis of unspecified carotid artery: Secondary | ICD-10-CM

## 2011-03-05 DIAGNOSIS — Z48812 Encounter for surgical aftercare following surgery on the circulatory system: Secondary | ICD-10-CM

## 2011-03-05 NOTE — Progress Notes (Signed)
Subjective:     Patient ID: [name], male   DOB: 1925/01/20, 78 y.o.   MRN: [id number]  HPI this 78 year old male returns for followup regarding his carotid occlusive disease. 08/31/2010 he had repair of a disrupted background patch at the right carotid endarterectomy site with replacement using vein patch from left leg. There was no evidence of infection only disruption . He denies any neurologic symptoms since that time including lateralizing weakness, aphasia, amaurosis fugax, diplopia, blurred vision, and syncope. He has been ambulating well and denies any cardiac symptoms.  Past Medical History  Diagnosis Date  . Anxiety   . Hypertension   . Hyperlipidemia   . CAD (coronary artery disease)   . Dementia   . Gout, unspecified 2012    crystal analysis at urgent care  . AF (atrial fibrillation)   . 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 Cancer 02/25/11    Squamous Cell Carcinoma- Left Dorsum Hand    History  Substance Use Topics  . Smoking status: Former Smoker    Types: Cigarettes  . Smokeless tobacco: Never Used   Comment: Remote mild smoking history  . Alcohol Use: No    Family History  Problem Relation Age of Onset  . Colon cancer Sister   . Hypertension Brother   . Cancer Brother     intestine  . Brain cancer Brother   . Melanoma Sister   . Coronary artery disease Son     75, heart attack    Allergies  Allergen Reactions  . Lorazepam (Ativan)   . Naproxen Sodium     REACTION: rash all over  . Niacin     REACTION: unspecified  . Ramipril     REACTION: syncope  . Sulfonamide Derivatives     REACTION: unspecified (no HCTZ allergy)  . Zolpidem Tartrate (Ambien)     Current outpatient prescriptions:allopurinol (ZYLOPRIM) 100 MG tablet, Take 1 tablet (100 mg total) by mouth daily., Disp: , Rfl: ;  ALPRAZolam (XANAX) 0.25 MG tablet, Take 1 tablet (0.25 mg total) by mouth 2 (two) times daily., Disp: 60 tablet, Rfl: 3;  CRESTOR  20 MG tablet, PLACE 1 TABLET INTO FEEDING TUBE ONCE DAILY, Disp: 30 tablet, Rfl: 3;  furosemide (LASIX) 40 MG tablet, PLACE 1 TABLET INTO FEEDING TUBE ONCE DAILY, Disp: 30 tablet, Rfl: 3 memantine (NAMENDA) 10 MG tablet, Take 1 tablet (10 mg total) by mouth 2 (two) times daily., Disp: , Rfl: ;  metoprolol (LOPRESSOR) 50 MG tablet, Take 1.5 tablets (75 mg total) by mouth 2 (two) times daily., Disp: , Rfl: ;  pantoprazole (PROTONIX) 40 MG tablet, take 1 tablet by mouth twice a day, Disp: 60 tablet, Rfl: 3;  sertraline (ZOLOFT) 50 MG tablet, Take 1.5 tablets (75 mg total) by mouth daily., Disp: 45 tablet, Rfl: 3 warfarin (COUMADIN) 2 MG tablet, take 1 tablet by mouth as directed, Disp: 30 tablet, Rfl: 6  BP 134/60  Pulse 69  Resp 16  Ht 5\' 5"  (1.651 m)  Wt 182 lb (82.555 kg)  BMI 30.29 kg/m2  SpO2 95%  Body mass index is 30.29 kg/(m^2).         Review of Systems denies chest pain, dyspnea on exertion, PND, orthopnea, hemoptysis, and lower extremity claudication. Continues to have symptoms of dementia. All other systems negative    Objective:   Physical Exam blood pressure 134/60 heart rate 69 respirations 16 General elderly well-developed well-nourished male in no apparent stress  alert and oriented x3 Lungs no rhonchi or wheezing HEENT normal for age Cardiovascular regular rhythm no murmurs carotid pulses 3+ no audible bruits incisions healed nicely Abdomen obese no palpable masses Lower extremity exam 3+ femoral pulses bilaterally with well perfused lower extremities and no ulcerations noted Neurologic normal Musculoskeletal free of major deformities  They ordered a carotid duplex exam which I reviewed and interpreted. The right carotid endarterectomy site where the patch was replaced with a vein patch appears normal with no evidence of high velocity or stenosis. Left internal carotid endarterectomy site has no evidence of significant restenosis    Assessment:     Doing well post  repair background patch disruption right carotid endarterectomy site-no neurologic symptoms-no evidence of restenosis    Plan:     Return in 6 months for carotid duplex exam to be seen by nurse practitioner. We'll continue on Coumadin therapy. Return to see sooner if patient develops any neurologic symptoms

## 2011-03-07 ENCOUNTER — Other Ambulatory Visit: Payer: Self-pay

## 2011-03-11 ENCOUNTER — Ambulatory Visit (INDEPENDENT_AMBULATORY_CARE_PROVIDER_SITE_OTHER): Payer: Medicare Other | Admitting: Family Medicine

## 2011-03-11 DIAGNOSIS — Z7901 Long term (current) use of anticoagulants: Secondary | ICD-10-CM

## 2011-03-11 DIAGNOSIS — Z5181 Encounter for therapeutic drug level monitoring: Secondary | ICD-10-CM

## 2011-03-11 DIAGNOSIS — I4891 Unspecified atrial fibrillation: Secondary | ICD-10-CM

## 2011-03-11 NOTE — Procedures (Unsigned)
CAROTID DUPLEX EXAM  INDICATION:  Follow up carotid artery disease.  HISTORY: Diabetes:  No. Cardiac:  CABG. Hypertension:  Yes. Smoking:  Previous. Previous Surgery:  Bilateral carotid endarterectomy with Dacron patch angioplasty in 1986, redo of right carotid endarterectomy 03/11/2002, both performed by Dr. Hart Rochester. CV History:  Currently asymptomatic. Amaurosis Fugax No, Paresthesias No, Hemiparesis No                                      RIGHT             LEFT Brachial systolic pressure:         116               112 Brachial Doppler waveforms:         Normal            Normal Vertebral direction of flow:        Antegrade         Antegrade DUPLEX VELOCITIES (cm/sec) CCA peak systolic                   191               71 ECA peak systolic                   81                217 ICA peak systolic                   68                128 ICA end diastolic                   20                16 PLAQUE MORPHOLOGY:                  Mixed             Mixed PLAQUE AMOUNT:                      Mild              Mild/moderate PLAQUE LOCATION:                    Bifurcation, CCA  CCA, ECA  IMPRESSION: 1. Patent bilateral carotid endarterectomy sites with no evidence of     re-stenosis of the internal carotid artery. 2. Left external carotid artery stenosis. 3. Antegrade vertebral arteries bilaterally.  ___________________________________________ Quita Skye Hart Rochester, M.D.  EM/MEDQ  D:  03/05/2011  T:  03/05/2011  Job:  [id number]

## 2011-03-11 NOTE — Patient Instructions (Signed)
Continue 2 mg daily except 1 mg Mon, Thurs, recheck 4 weeks

## 2011-04-08 ENCOUNTER — Ambulatory Visit (INDEPENDENT_AMBULATORY_CARE_PROVIDER_SITE_OTHER): Payer: Medicare Other | Admitting: Family Medicine

## 2011-04-08 DIAGNOSIS — Z7901 Long term (current) use of anticoagulants: Secondary | ICD-10-CM

## 2011-04-08 DIAGNOSIS — I4891 Unspecified atrial fibrillation: Secondary | ICD-10-CM

## 2011-04-08 DIAGNOSIS — Z5181 Encounter for therapeutic drug level monitoring: Secondary | ICD-10-CM

## 2011-04-08 LAB — POCT INR: INR: 2

## 2011-04-08 NOTE — Patient Instructions (Signed)
Continue current dose, check in 4 weeks  

## 2011-04-11 ENCOUNTER — Ambulatory Visit (INDEPENDENT_AMBULATORY_CARE_PROVIDER_SITE_OTHER): Payer: Medicare Other | Admitting: Family Medicine

## 2011-04-11 ENCOUNTER — Encounter: Payer: Self-pay | Admitting: Family Medicine

## 2011-04-11 DIAGNOSIS — J069 Acute upper respiratory infection, unspecified: Secondary | ICD-10-CM

## 2011-04-11 NOTE — Progress Notes (Signed)
Cold sx.  Started about 1 week ago.  Taking delsym, minimal change in cough.  Some post nasal gtt and ST.  No fevers. No sputum.  Some throat clearing.  Not SOB, no CP.  No pain.  Fatigued.  Appetite okay.  No vomiting, no diarrhea. Eyes are puffy.    Meds, vitals, and allergies reviewed.   ROS: See HPI.  Otherwise, noncontributory.  GEN: nad, alert HEENT: mucous membranes moist, tm w/o erythema, nasal exam w/o erythema, clear discharge noted,  OP with cobblestoning, eyelids slightly puffy NECK: supple w/o LA CV: ectopy noted but not tachy PULM: ctab, no inc wob EXT: no edema SKIN: no acute rash

## 2011-04-11 NOTE — Patient Instructions (Signed)
Drink plenty of fluids, take tylenol as needed, and gargle with warm salt water for your throat.  This should gradually improve.  Take care.  Let us know if you have other concerns.    

## 2011-04-12 DIAGNOSIS — J069 Acute upper respiratory infection, unspecified: Secondary | ICD-10-CM | POA: Insufficient documentation

## 2011-04-12 NOTE — Assessment & Plan Note (Signed)
Viral appearing changes in the posterior OP with clear lungs.  Should self resolve.  Would not want to expose pt to abx with low chance of benefit, esp with coumadin use.  D/w pt and family.  Supportive care and f/u prn.  They agree.

## 2011-04-17 ENCOUNTER — Encounter (HOSPITAL_COMMUNITY): Payer: Self-pay | Admitting: *Deleted

## 2011-04-17 ENCOUNTER — Observation Stay (HOSPITAL_COMMUNITY)
Admission: EM | Admit: 2011-04-17 | Discharge: 2011-04-19 | Disposition: A | Discharge status: Home / Self Care | Payer: Medicare Other | Source: Ambulatory Visit | Attending: Internal Medicine | Admitting: Internal Medicine

## 2011-04-17 ENCOUNTER — Emergency Department (HOSPITAL_COMMUNITY): Payer: Medicare Other

## 2011-04-17 DIAGNOSIS — N289 Disorder of kidney and ureter, unspecified: Secondary | ICD-10-CM | POA: Insufficient documentation

## 2011-04-17 DIAGNOSIS — K529 Noninfective gastroenteritis and colitis, unspecified: Secondary | ICD-10-CM | POA: Diagnosis present

## 2011-04-17 DIAGNOSIS — F0390 Unspecified dementia without behavioral disturbance: Secondary | ICD-10-CM | POA: Diagnosis present

## 2011-04-17 DIAGNOSIS — K5289 Other specified noninfective gastroenteritis and colitis: Principal | ICD-10-CM | POA: Insufficient documentation

## 2011-04-17 DIAGNOSIS — N179 Acute kidney failure, unspecified: Secondary | ICD-10-CM | POA: Diagnosis present

## 2011-04-17 DIAGNOSIS — R112 Nausea with vomiting, unspecified: Secondary | ICD-10-CM

## 2011-04-17 DIAGNOSIS — J069 Acute upper respiratory infection, unspecified: Secondary | ICD-10-CM | POA: Diagnosis present

## 2011-04-17 DIAGNOSIS — E860 Dehydration: Secondary | ICD-10-CM | POA: Diagnosis present

## 2011-04-17 LAB — POCT I-STAT, CHEM 8
Calcium, Ion: 1.16 mmol/L (ref 1.12–1.32)
Chloride: 100 mEq/L (ref 96–112)
Glucose, Bld: 158 mg/dL — ABNORMAL HIGH (ref 70–99)
HCT: 49 % (ref 39.0–52.0)
Hemoglobin: 16.7 g/dL (ref 13.0–17.0)
Potassium: 3.9 mEq/L (ref 3.5–5.1)

## 2011-04-17 LAB — CBC
MCHC: 35.2 g/dL (ref 30.0–36.0)
Platelets: 391 10*3/uL (ref 150–400)
RDW: 15 % (ref 11.5–15.5)
WBC: 22.5 10*3/uL — ABNORMAL HIGH (ref 4.0–10.5)

## 2011-04-17 MED ORDER — SODIUM CHLORIDE 0.9 % IV BOLUS (SEPSIS)
1000.0000 mL | Freq: Once | INTRAVENOUS | Status: AC
  Administered 2011-04-18: 1000 mL via INTRAVENOUS

## 2011-04-17 MED ORDER — ONDANSETRON HCL 4 MG/2ML IJ SOLN
4.0000 mg | Freq: Once | INTRAMUSCULAR | Status: AC
  Administered 2011-04-18: 4 mg via INTRAVENOUS
  Filled 2011-04-17: qty 2

## 2011-04-17 NOTE — ED Provider Notes (Signed)
History     CSN: [id number]  Arrival date & time 04/17/11  1847   First MD Initiated Contact with Patient 04/17/11 2249      Chief Complaint  Patient presents with  . Abdominal Pain  . Nausea  . Emesis  . Diarrhea    (Consider location/radiation/quality/duration/timing/severity/associated sxs/prior treatment) HPI Comments: Patient with history of Alzheimer's, it A. fib, hyperlipidemia, and hypertension presents emergency department with a chief complaint of nausea vomiting and diarrhea.  Patient is brought in by his son who states that the patient became symptomatic today and has not been able to tolerate any liquids or foods.  In addition he reports that his father has had a productive cough for the last 5 days but denies him having fevers, night sweats, chills.  Patient denies any abdominal pain, hematuria, hematochezia, melena, hematemesis.  Patient denies chest pain, shortness of breath.  The history is provided by the patient.    Past Medical History  Diagnosis Date  . Anxiety   . Hypertension   . Hyperlipidemia   . CAD (coronary artery disease)   . Dementia   . Gout, unspecified 2012    crystal analysis at urgent care  . AF (atrial fibrillation)   . 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 Cancer 02/25/11    Squamous Cell Carcinoma- Left Dorsum Hand    Past Surgical History  Procedure Date  . Cholecystectomy   . Endarterectomy 1996    Bilateral (2 weeks apart)  . Skin cancer removal 06/2007    (? basal cell)  . 4 vessel cabg 06/97  . Carotid endarterectomy 03/11/02  . Mri brain 02/23/10    No acute abnormality Chronic Cerebral Ischemia Stable Ventr Prominence due to Volume loss  . Pseudoaneurysm repair 08/26/10    Right carotid endarterectomy pseudoaneurysm  . Peg placement     Family History  Problem Relation Age of Onset  . Colon cancer Sister   . Hypertension Brother   . Cancer Brother     intestine  . Brain cancer Brother    . Melanoma Sister   . Coronary artery disease Son     88, heart attack    History  Substance Use Topics  . Smoking status: Former Smoker    Types: Cigarettes  . Smokeless tobacco: Never Used   Comment: Remote mild smoking history  . Alcohol Use: No      Review of Systems  Constitutional: Negative for fever, chills and appetite change.  HENT: Negative for congestion.   Eyes: Negative for visual disturbance.  Respiratory: Negative for shortness of breath.   Cardiovascular: Negative for chest pain and leg swelling.  Gastrointestinal: Positive for nausea, vomiting and diarrhea. Negative for abdominal pain, constipation, blood in stool, abdominal distention, anal bleeding and rectal pain.  Genitourinary: Negative for dysuria, urgency and frequency.  Neurological: Negative for dizziness, syncope, weakness, light-headedness, numbness and headaches.  Psychiatric/Behavioral: Negative for confusion.  All other systems reviewed and are negative.    Allergies  Lorazepam; Naproxen sodium; Niacin; Ramipril; Sulfonamide derivatives; and Zolpidem tartrate  Home Medications   Current Outpatient Rx  Name Route Sig Dispense Refill  . ALLOPURINOL 100 MG PO TABS Oral Take 100 mg by mouth daily.    Marland Kitchen ALPRAZOLAM 0.25 MG PO TABS Oral Take 0.25 mg by mouth 2 (two) times daily.    . CRESTOR 20 MG PO TABS  PLACE 1 TABLET INTO FEEDING TUBE ONCE DAILY 30 tablet 3  .  FUROSEMIDE 40 MG PO TABS      . MEMANTINE HCL 10 MG PO TABS Oral Take 10 mg by mouth 2 (two) times daily.    Marland Kitchen METOPROLOL TARTRATE 50 MG PO TABS Oral Take 1.5 tablets (75 mg total) by mouth 2 (two) times daily.    Marland Kitchen PANTOPRAZOLE SODIUM 40 MG PO TBEC      . SERTRALINE HCL 50 MG PO TABS Oral Take 75 mg by mouth daily.    . WARFARIN SODIUM 2 MG PO TABS Oral Take 1-2 mg by mouth See admin instructions. Take one-half pill (1mg ) on Monday and Thursday. Take 1 pill (2mg ) on Tuesday, Wednesday, Friday, Saturday, and Sunday.      BP 98/59   Pulse 77  Temp(Src) 98.2 F (36.8 C) (Oral)  Resp 18  SpO2 100%  Physical Exam  Nursing note and vitals reviewed. Constitutional: Vital signs are normal. He appears well-developed and well-nourished. No distress.  HENT:  Head: Normocephalic and atraumatic.  Mouth/Throat: Uvula is midline, oropharynx is clear and moist and mucous membranes are normal.  Eyes: Conjunctivae and EOM are normal. Pupils are equal, round, and reactive to light.  Neck: Normal range of motion and full passive range of motion without pain. Neck supple. No spinous process tenderness and no muscular tenderness present. No rigidity. No Brudzinski's sign noted.  Cardiovascular: Normal rate and regular rhythm.   Pulmonary/Chest: Effort normal and breath sounds normal. No accessory muscle usage. Not tachypneic. No respiratory distress.  Abdominal: Soft. Normal appearance and bowel sounds are normal. He exhibits no distension, no ascites, no pulsatile midline mass and no mass. There is no tenderness. There is no CVA tenderness. No hernia.       Soft nontender abdomen.  Lymphadenopathy:    He has no cervical adenopathy.  Neurological: He is alert.  Skin: Skin is warm and dry. No rash noted. He is not diaphoretic.  Psychiatric: He has a normal mood and affect. His speech is normal and behavior is normal.    ED Course  Procedures (including critical care time)  Labs Reviewed  CBC - Abnormal; Notable for the following:    WBC 22.5 (*)    All other components within normal limits  POCT I-STAT, CHEM 8 - Abnormal; Notable for the following:    Creatinine, Ser 1.40 (*)    Glucose, Bld 158 (*)    All other components within normal limits  CBC   Dg Chest 2 View  04/17/2011  *RADIOLOGY REPORT*  Clinical Data: Cough  CHEST - 2 VIEW  Comparison: 09/16/2010  Findings: Normal heart size.  Bronchitic changes.  Clear lungs.  No pneumothorax and no pleural effusion.  IMPRESSION: No active cardiopulmonary disease.  Original Report  Authenticated By: Donavan Burnet, M.D.     No diagnosis found.    MDM  N/V/D  Patient with nausea vomiting and diarrhea of likely viral gastritis etiology.  Dehydration is likely cause acute renal failure.  Patient has been rehydrated in the emergency department and will be admitted for observation.  Chest x-ray ordered do to cough of 5 days and was negative for acute cardiac or pulmonary changes.  Patient is hemodynamically stable prior to admit. Spoke with Dr. Mikeal Hawthorne who requests non con CT to r/o ischemic bowel. Dr. Freida Busman resumed care and will page [facility] when CT resulted         [facility], PA-C 04/18/11 1610

## 2011-04-17 NOTE — ED Provider Notes (Signed)
Medical screening examination/treatment/procedure(s) were conducted as a shared visit with non-physician practitioner(s) and myself.  I personally evaluated the patient during the encounter  Patient presents today with his son for onset of nausea, vomiting and diarrhea for one day.  Patient is unable to give a history himself to 2 dementia.  Patient lives with his son full time.  Patient denies any abdominal pain and has a soft and benign abdominal exam at this time.  The son has not noted any bloody stools or bloody emesis.  The patient does show some signs of acute renal insufficiency related to likely dehydration from his illness.  He also has some mild hypotension likely related to dehydration as well.  Patient does have a leukocytosis but at this point in time has a clinical picture that does appear to be consistent with gastroenteritis.  Patient has no complaints of abdominal pain and is soft and benign exam so did not stand him at this time but would bring him in for observation he continued hydration to recheck him tomorrow.  Nat Christen, MD 04/18/11 0000

## 2011-04-17 NOTE — ED Notes (Addendum)
Pt reports n/v/d started this am with generalized abdominal discomfort. Denies fevers. Pt lives with son, pt with hx of dementia. Son states he is afraid he is getting dehydrated, reports pt is becoming a little unstable on feet as day has progressed.

## 2011-04-17 NOTE — ED Notes (Signed)
Pt's son report pt has been experiencing, n/v/d x1 day - denies fever - pt reported to have lower abd today however pt denies any pain at present. Pt w/ hx of alzheimer's and only c/o feeling hungry/thirsty.

## 2011-04-18 ENCOUNTER — Emergency Department (HOSPITAL_COMMUNITY): Payer: Medicare Other

## 2011-04-18 ENCOUNTER — Encounter (HOSPITAL_COMMUNITY): Payer: Self-pay | Admitting: Radiology

## 2011-04-18 ENCOUNTER — Telehealth: Payer: Self-pay | Admitting: Family Medicine

## 2011-04-18 DIAGNOSIS — N179 Acute kidney failure, unspecified: Secondary | ICD-10-CM | POA: Diagnosis present

## 2011-04-18 DIAGNOSIS — E860 Dehydration: Secondary | ICD-10-CM | POA: Diagnosis present

## 2011-04-18 DIAGNOSIS — K529 Noninfective gastroenteritis and colitis, unspecified: Secondary | ICD-10-CM | POA: Diagnosis present

## 2011-04-18 LAB — PROTIME-INR
INR: 2 — ABNORMAL HIGH (ref 0.00–1.49)
Prothrombin Time: 23 seconds — ABNORMAL HIGH (ref 11.6–15.2)

## 2011-04-18 MED ORDER — ONDANSETRON HCL 4 MG PO TABS: 4.0000 mg | ORAL_TABLET | Freq: Four times a day (QID) | ORAL | Status: DC | PRN

## 2011-04-18 MED ORDER — SODIUM CHLORIDE 0.9 % IV BOLUS (SEPSIS)
1000.0000 mL | Freq: Once | INTRAVENOUS | Status: AC
  Administered 2011-04-18: 1000 mL via INTRAVENOUS

## 2011-04-18 MED ORDER — ALPRAZOLAM 0.25 MG PO TABS
0.2500 mg | ORAL_TABLET | Freq: Two times a day (BID) | ORAL | Status: DC
  Administered 2011-04-18 – 2011-04-19 (×3): 0.25 mg via ORAL
  Filled 2011-04-18 (×3): qty 1

## 2011-04-18 MED ORDER — SODIUM CHLORIDE 0.9 % IV SOLN
INTRAVENOUS | Status: DC
  Administered 2011-04-18 – 2011-04-19 (×3): via INTRAVENOUS

## 2011-04-18 MED ORDER — IOHEXOL 300 MG/ML  SOLN
100.0000 mL | Freq: Once | INTRAMUSCULAR | Status: AC | PRN
  Administered 2011-04-18: 100 mL via INTRAVENOUS

## 2011-04-18 MED ORDER — SERTRALINE HCL 50 MG PO TABS
75.0000 mg | ORAL_TABLET | Freq: Every day | ORAL | Status: DC
  Administered 2011-04-18 – 2011-04-19 (×2): 75 mg via ORAL
  Filled 2011-04-18 (×2): qty 1

## 2011-04-18 MED ORDER — MEMANTINE HCL 10 MG PO TABS
10.0000 mg | ORAL_TABLET | Freq: Two times a day (BID) | ORAL | Status: DC
  Administered 2011-04-18 – 2011-04-19 (×3): 10 mg via ORAL
  Filled 2011-04-18 (×4): qty 1

## 2011-04-18 MED ORDER — METOPROLOL TARTRATE 50 MG PO TABS
75.0000 mg | ORAL_TABLET | Freq: Two times a day (BID) | ORAL | Status: DC
  Administered 2011-04-18 – 2011-04-19 (×3): 75 mg via ORAL
  Filled 2011-04-18 (×4): qty 1

## 2011-04-18 MED ORDER — WARFARIN SODIUM 1 MG PO TABS
1.0000 mg | ORAL_TABLET | Freq: Once | ORAL | Status: AC
  Administered 2011-04-18: 1 mg via ORAL
  Filled 2011-04-18: qty 1

## 2011-04-18 MED ORDER — PANTOPRAZOLE SODIUM 40 MG PO TBEC
40.0000 mg | DELAYED_RELEASE_TABLET | Freq: Every day | ORAL | Status: DC
  Administered 2011-04-18 – 2011-04-19 (×2): 40 mg via ORAL
  Filled 2011-04-18 (×2): qty 1

## 2011-04-18 MED ORDER — ATORVASTATIN CALCIUM 10 MG PO TABS
10.0000 mg | ORAL_TABLET | Freq: Every day | ORAL | Status: DC
  Administered 2011-04-18: 10 mg via ORAL
  Filled 2011-04-18 (×2): qty 1

## 2011-04-18 MED ORDER — ONDANSETRON HCL 4 MG/2ML IJ SOLN: 4.0000 mg | Freq: Four times a day (QID) | INTRAMUSCULAR | Status: DC | PRN

## 2011-04-18 MED ORDER — WARFARIN - PHARMACIST DOSING INPATIENT: Freq: Every day | Status: DC

## 2011-04-18 MED ORDER — BIOTENE DRY MOUTH MT LIQD
15.0000 mL | Freq: Two times a day (BID) | OROMUCOSAL | Status: DC
  Administered 2011-04-18 – 2011-04-19 (×2): 15 mL via OROMUCOSAL

## 2011-04-18 NOTE — Progress Notes (Signed)
Patient admitted earlier today for n/v/diarrhea and abdominal pain. Already improved. Likely has viral gastroenteritis. Also developed some mild hypotension and ARF 2/2 dehydration. Will advance diet and continue IVF. Plan for DC home in am. Discussed with patient and son his wishes to be DNR. Updated in chart. Patient has some mild dementia.

## 2011-04-18 NOTE — Telephone Encounter (Signed)
Triage Record Num: [id number] Operator: Reino Bellis Patient Name: [name] Call Date & Time: 04/17/2011 6:01:59PM Patient Phone: [phone] PCP: Crawford Givens Patient Gender: Male PCP Fax : Patient DOB: 02-Oct-1924 Practice Name[facility] Reason for Call: Caller: Gary/Other; PCP: Crawford Givens Clelia Croft); CB#: [phone]; Call regarding Vomiting; Gary/Son calling about vomiting. Onset: 04/17/11. Vomited x 2+. Diarrhea also present. Pt. also c/o lower abdominal pain. Emergent sxs of nausea/vomiting associated with sudden onset of moderate to severe pain in genitals, groin or lower abdomen. Advised pt. go to ED [facility]) for evaluation and care advice given. Caller verbalizes understanding. Protocol(s) Used: Nausea or Vomiting Recommended Outcome per Protocol: See ED Immediately Reason for Outcome: Nausea/vomiting associated with sudden onset of moderate to severe pain in genitals, groin or lower abdomen. Care Advice: ~ Allow the patient to be in a position of comfort. ~ Another adult should drive. ~ DO NOT give medication to control the pain. ~ Do not give the patient anything to eat or drink. Call EMS 911 if signs and symptoms of shock develop (such as unable to stand due to faintness, dizziness, or lightheadedness; new onset of confusion; slow to respond or difficult to awaken; skin is pale, gray, cool, or moist to touch; severe weakness; loss of consciousness). ~ ~ IMMEDIATE ACTION Write down provider's name. List or place the following in a bag for transport with the patient: current prescription and/or nonprescription medications; alternative treatments, therapies and medications; and street drugs. ~ 04/17/2011 6:19:31PM Page 1 of 1 [email or web address]

## 2011-04-18 NOTE — H&P (Signed)
[name] is an 78 y.o. male.   Chief Complaint: Nausea, Vomiting and Diarrhea HPI: 78 yo man brought in by his son with complaint of NVD for 2 days. His son lives with him and had recent respiratory tract infection that they both ended up having. He still has cough which is non productive. He now noticed that he was having some abdominal discomfort followed by Nausea and episodes of vomiting and 2 episodes of Diarrhea. His work up in the ER shows evidence of dehydration, ARF and possibly gastroenteritis. His symptoms seem to be resolving now.   Past Medical History  Diagnosis Date  . Anxiety   . Hypertension   . Hyperlipidemia   . CAD (coronary artery disease)   . Dementia   . Gout, unspecified 2012    crystal analysis at urgent care  . AF (atrial fibrillation)   . 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 Cancer 02/25/11    Squamous Cell Carcinoma- Left Dorsum Hand    Past Surgical History  Procedure Date  . Cholecystectomy   . Endarterectomy 1996    Bilateral (2 weeks apart)  . Skin cancer removal 06/2007    (? basal cell)  . 4 vessel cabg 06/97  . Carotid endarterectomy 03/11/02  . Mri brain 02/23/10    No acute abnormality Chronic Cerebral Ischemia Stable Ventr Prominence due to Volume loss  . Pseudoaneurysm repair 08/26/10    Right carotid endarterectomy pseudoaneurysm  . Peg placement     Family History  Problem Relation Age of Onset  . Colon cancer Sister   . Hypertension Brother   . Cancer Brother     intestine  . Brain cancer Brother   . Melanoma Sister   . Coronary artery disease Son     89, heart attack   Social History:  reports that he has quit smoking. His smoking use included Cigarettes. He has never used smokeless tobacco. He reports that he does not drink alcohol or use illicit drugs.  Allergies:  Allergies  Allergen Reactions  . Lorazepam (Ativan)   . Naproxen Sodium     REACTION: rash all over  . Niacin    REACTION: unspecified  . Ramipril     REACTION: syncope  . Sulfonamide Derivatives     REACTION: unspecified (no HCTZ allergy)  . Zolpidem Tartrate (Ambien)     Medications Prior to Admission  Medication Dose Route Frequency Provider Last Rate Last Dose  . iohexol (OMNIPAQUE) 300 MG/ML solution 100 mL  100 mL Intravenous Once PRN Medication Radiologist, MD   100 mL at 04/18/11 0228  . ondansetron (ZOFRAN) injection 4 mg  4 mg Intravenous Once Lisette Paz, PA-C   4 mg at 04/18/11 0000  . sodium chloride 0.9 % bolus 1,000 mL  1,000 mL Intravenous Once Lisette Paz, PA-C   1,000 mL at 04/18/11 0000  . sodium chloride 0.9 % bolus 1,000 mL  1,000 mL Intravenous Once Lisette Paz, PA-C   1,000 mL at 04/18/11 0133   Medications Prior to Admission  Medication Sig Dispense Refill  . allopurinol (ZYLOPRIM) 100 MG tablet Take 100 mg by mouth daily.      Marland Kitchen ALPRAZolam (XANAX) 0.25 MG tablet Take 0.25 mg by mouth 2 (two) times daily.      . CRESTOR 20 MG tablet PLACE 1 TABLET INTO FEEDING TUBE ONCE DAILY  30 tablet  3  . furosemide (LASIX) 40 MG tablet       .  memantine (NAMENDA) 10 MG tablet Take 10 mg by mouth 2 (two) times daily.      . metoprolol (LOPRESSOR) 50 MG tablet Take 1.5 tablets (75 mg total) by mouth 2 (two) times daily.      . pantoprazole (PROTONIX) 40 MG tablet       . sertraline (ZOLOFT) 50 MG tablet Take 75 mg by mouth daily.        Results for orders placed during the hospital encounter of 04/17/11 (from the past 48 hour(s))  POCT I-STAT, CHEM 8     Status: Abnormal   Collection Time   04/17/11  8:37 PM      Component Value Range Comment   Sodium 140  135 - 145 (mEq/L)    Potassium 3.9  3.5 - 5.1 (mEq/L)    Chloride 100  96 - 112 (mEq/L)    BUN 19  6 - 23 (mg/dL)    Creatinine, Ser 8.84 (*) 0.50 - 1.35 (mg/dL)    Glucose, Bld 166 (*) 70 - 99 (mg/dL)    Calcium, Ion 0.63  1.12 - 1.32 (mmol/L)    TCO2 27  0 - 100 (mmol/L)    Hemoglobin 16.7  13.0 - 17.0 (g/dL)    HCT 01.6   01.0 - 93.2 (%)   CBC     Status: Abnormal   Collection Time   04/17/11  8:45 PM      Component Value Range Comment   WBC 22.5 (*) 4.0 - 10.5 (K/uL)    RBC 4.79  4.22 - 5.81 (MIL/uL)    Hemoglobin 15.1  13.0 - 17.0 (g/dL)    HCT 35.5  73.2 - 20.2 (%)    MCV 89.6  78.0 - 100.0 (fL)    MCH 31.5  26.0 - 34.0 (pg)    MCHC 35.2  30.0 - 36.0 (g/dL)    RDW 54.2  70.6 - 23.7 (%)    Platelets 391  150 - 400 (K/uL)    Dg Chest 2 View  04/17/2011  *RADIOLOGY REPORT*  Clinical Data: Cough  CHEST - 2 VIEW  Comparison: 09/16/2010  Findings: Normal heart size.  Bronchitic changes.  Clear lungs.  No pneumothorax and no pleural effusion.  IMPRESSION: No active cardiopulmonary disease.  Original Report Authenticated By: Donavan Burnet, M.D.   Ct Abdomen Pelvis W Contrast  04/18/2011  *RADIOLOGY REPORT*  Clinical Data: Abdominal pain  CT ABDOMEN AND PELVIS WITH CONTRAST  Technique:  Multidetector CT imaging of the abdomen and pelvis was performed following the standard protocol during bolus administration of intravenous contrast.  Contrast: OMNIPAQUE IOHEXOL 300 MG/ML IJ SOLN  Comparison: 09/21/2010 radiograph  Findings: Calcified right lower lobe nodule.  Coronary artery calcification.  Status post median sternotomy and CABG.  No pleural or pericardial effusion.  Low attenuation of the liver suggests fatty infiltration. Scattered calcifications in keeping with prior granulomas infection.  Unremarkable spleen, pancreas, adrenal glands.  Status post cholecystectomy.  No biliary ductal dilatation.  Symmetric renal enhancement.  No hydronephrosis or hydroureter.  No urinary tract calculi identified.  Colonic diverticulosis.  No CT evidence for diverticulitis.  No bowel obstruction.  Normal appendix.  No free intraperitoneal air or fluid.  No lymphadenopathy.  There is scattered atherosclerotic calcification of the aorta and its branches. No aneurysmal dilatation.  Circumferential bladder wall thickening,  nonspecific given incomplete distension.  Multilevel degenerative changes of the imaged spine. No acute or aggressive appearing osseous lesion.  IMPRESSION: No acute CT abnormality identified.  Hepatic steatosis.  Sequelae of prior granulomas infection.  Original Report Authenticated By: Waneta Martins, M.D.    Review of Systems  Constitutional: Positive for malaise/fatigue.  HENT: Negative.   Eyes: Negative.   Respiratory: Positive for cough. Negative for hemoptysis, sputum production, shortness of breath and wheezing.   Cardiovascular: Negative.   Gastrointestinal: Positive for nausea, vomiting, abdominal pain and diarrhea. Negative for heartburn, constipation, blood in stool and melena.  Genitourinary: Negative.   Musculoskeletal: Negative.   Skin: Negative.   Neurological: Positive for weakness.  Endo/Heme/Allergies: Negative.     Blood pressure 98/59, pulse 77, temperature 98.2 F (36.8 C), temperature source Oral, resp. rate 18, SpO2 100.00%. Physical Exam  Constitutional: He is oriented to person, place, and time. He appears well-developed and well-nourished.  HENT:  Head: Normocephalic and atraumatic.  Right Ear: External ear normal.  Left Ear: External ear normal.  Mouth/Throat: Oropharynx is clear and moist.  Eyes: Conjunctivae and EOM are normal. Pupils are equal, round, and reactive to light.  Neck: Normal range of motion. Neck supple.  Cardiovascular: Normal rate, regular rhythm, normal heart sounds and intact distal pulses.   Respiratory: Effort normal and breath sounds normal.  GI: Soft. Bowel sounds are normal.  Musculoskeletal: Normal range of motion.  Neurological: He is alert and oriented to person, place, and time. He has normal reflexes.  Skin: Skin is warm and dry.  Psychiatric: He has a normal mood and affect. His speech is normal and behavior is normal. Judgment and thought content normal. Cognition and memory are impaired. He exhibits abnormal remote  memory. He exhibits normal recent memory.     Assessment/Plan 1. Acute Gastroenteritis: Most likely viral. Will admit for observation and advance diet slowly while having conservative measures. 2. URI: Viral most likely. Not wheezing. Will consider antibiotics if he spikes fever. CXR shows no infiltrates. 3. ARF: Pre renal Baseline Creatinine was less than 1.0. Will hydrate and follow renal function 4. Dementia: mild. Follow closely.  Robie Mcniel,LAWAL 04/18/2011, 4:38 AM

## 2011-04-18 NOTE — Progress Notes (Signed)
Pt is in the high risk oral care protocol, but cannot receive Chlorhexidine rinse due to contraindication of allergy to Ambien. Will place him on Biotene BID only.

## 2011-04-18 NOTE — ED Notes (Addendum)
Floor requesting bed with alarm due to dementia. Informed RN that son plans to stay with patient. Will call when new bed obtained

## 2011-04-18 NOTE — Progress Notes (Signed)
ANTICOAGULATION CONSULT NOTE - Follow Up Consult  Pharmacy Consult for Warfarin Indication: Afib  Patient Measurements: Height: 5\' 3"  (160 cm) Weight: 176 lb 2.4 oz (79.9 kg) IBW/kg (Calculated) : 56.9   Labs:  Basename 04/18/11 1310 04/17/11 2045 04/17/11 2037  HGB -- 15.1 16.7  HCT -- 42.9 49.0  PLT -- 391 --  APTT -- -- --  LABPROT 23.0* -- --  INR 2.00* -- --  HEPARINUNFRC -- -- --  CREATININE -- -- 1.40*  CKTOTAL -- -- --  CKMB -- -- --  TROPONINI -- -- --   Estimated Creatinine Clearance: 36.7 ml/min (by C-G formula based on Cr of 1.4).  Assessment: 78 y.o. M resumed on warfarin from PTA for Afib. INR this a.m is therapeutic (INR 2, goal of 2-3). PTA dose was 2 mg daily EXCEPT for 1 mg on Mondays and Thursdays.   Goal of Therapy:  INR of 2-3   Plan:  1. Warfarin 1 mg x 1 dose at 1800 tonight 2. Will continue to monitor for any signs/symptoms of bleeding and will follow up with PT/INR in the a.m.   Georgina Pillion, PharmD, BCPS Clinical Pharmacist Pager: [phone] 04/18/2011 2:52 PM

## 2011-04-18 NOTE — Progress Notes (Signed)
ANTICOAGULATION CONSULT NOTE - Initial Consult  Pharmacy Consult for Coumadin Indication: atrial fibrillation  Allergies  Allergen Reactions  . Lorazepam (Ativan)   . Naproxen Sodium     REACTION: rash all over  . Niacin     REACTION: unspecified  . Ramipril     REACTION: syncope  . Sulfonamide Derivatives     REACTION: unspecified (no HCTZ allergy)  . Zolpidem Tartrate (Ambien)     Patient Measurements: Height: 5\' 3"  (160 cm) Weight: 176 lb 2.4 oz (79.9 kg) IBW/kg (Calculated) : 56.9   Vital Signs: Temp: 98.2 F (36.8 C) (03/14 0637) Temp src: Oral (03/14 0637) BP: 145/64 mmHg (03/14 0637) Pulse Rate: 81  (03/14 0637)  Labs:  Basename 04/17/11 2045 04/17/11 2037  HGB 15.1 16.7  HCT 42.9 49.0  PLT 391 --  APTT -- --  LABPROT -- --  INR -- --  HEPARINUNFRC -- --  CREATININE -- 1.40*  CKTOTAL -- --  CKMB -- --  TROPONINI -- --   Estimated Creatinine Clearance: 36.7 ml/min (by C-G formula based on Cr of 1.4).  Medical History: Past Medical History  Diagnosis Date  . Anxiety   . Hypertension   . Hyperlipidemia   . CAD (coronary artery disease)   . Dementia   . Gout, unspecified 2012    crystal analysis at urgent care  . AF (atrial fibrillation)   . 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 Cancer 02/25/11    Squamous Cell Carcinoma- Left Dorsum Hand    Medications:  Prescriptions prior to admission  Medication Sig Dispense Refill  . allopurinol (ZYLOPRIM) 100 MG tablet Take 100 mg by mouth daily.      Marland Kitchen ALPRAZolam (XANAX) 0.25 MG tablet Take 0.25 mg by mouth 2 (two) times daily.      . CRESTOR 20 MG tablet PLACE 1 TABLET INTO FEEDING TUBE ONCE DAILY  30 tablet  3  . furosemide (LASIX) 40 MG tablet       . memantine (NAMENDA) 10 MG tablet Take 10 mg by mouth 2 (two) times daily.      . metoprolol (LOPRESSOR) 50 MG tablet Take 1.5 tablets (75 mg total) by mouth 2 (two) times daily.      . pantoprazole (PROTONIX) 40 MG tablet        . sertraline (ZOLOFT) 50 MG tablet Take 75 mg by mouth daily.      Marland Kitchen warfarin (COUMADIN) 2 MG tablet Take 1-2 mg by mouth See admin instructions. Take one-half pill (1mg ) on Monday and Thursday. Take 1 pill (2mg ) on Tuesday, Wednesday, Friday, Saturday, and Sunday.        Assessment: 78 yo male admitted with N/V/D, h/o Afib to continue anticoagulation  Goal of Therapy:  INR 2-3   Plan:  F/U INR and redose Coumadin as indicated.  Eddie Candle 04/18/2011,7:08 AM

## 2011-04-18 NOTE — ED Notes (Addendum)
Completed oral contrast. CT notified.

## 2011-04-18 NOTE — Progress Notes (Signed)
Utilization review completed.  

## 2011-04-18 NOTE — ED Provider Notes (Signed)
Medical screening examination/treatment/procedure(s) were conducted as a shared visit with non-physician practitioner(s) and myself.  I personally evaluated the patient during the encounter   Nat Christen, MD 04/18/11 [phone]

## 2011-04-19 LAB — CBC
Hemoglobin: 11.5 g/dL — ABNORMAL LOW (ref 13.0–17.0)
RBC: 3.86 MIL/uL — ABNORMAL LOW (ref 4.22–5.81)

## 2011-04-19 LAB — BASIC METABOLIC PANEL
CO2: 24 mEq/L (ref 19–32)
Glucose, Bld: 88 mg/dL (ref 70–99)
Potassium: 4.1 mEq/L (ref 3.5–5.1)
Sodium: 135 mEq/L (ref 135–145)

## 2011-04-19 LAB — PROTIME-INR: INR: 1.94 — ABNORMAL HIGH (ref 0.00–1.49)

## 2011-04-19 MED ORDER — WARFARIN SODIUM 2 MG PO TABS
2.0000 mg | ORAL_TABLET | Freq: Once | ORAL | Status: DC
  Filled 2011-04-19: qty 1

## 2011-04-19 MED ORDER — MENTHOL 3 MG MT LOZG
1.0000 | LOZENGE | OROMUCOSAL | Status: DC | PRN
  Filled 2011-04-19: qty 9

## 2011-04-19 NOTE — Progress Notes (Signed)
   CARE MANAGEMENT NOTE 04/19/2011  Patient:  [name], [name]   Account Number:  [id number]  Date Initiated:  04/18/2011  Documentation initiated by:  Letha Cape  Subjective/Objective Assessment:   dx gastroenteritis, renal failure  admit as observation- lives with children     Action/Plan:   Anticipated DC Date:  04/19/2011   Anticipated DC Plan:  HOME/SELF CARE      DC Planning Services  CM consult      Choice offered to / List presented to:             Status of service:  Completed, signed off Medicare Important Message given?   (If response is "NO", the following Medicare IM given date fields will be blank) Date Medicare IM given:   Date Additional Medicare IM given:    Discharge Disposition:  HOME/SELF CARE  Per UR Regulation:    If discussed at Long Length of Stay Meetings, dates discussed:    Comments:  04/19/11 10:52 Letha Cape RN, BSN [phone] Patient lives with son, pta independent.  Patient has medication coverage and transportation, patient for possible dc today.   3/14//13 16:10 Letha Cape RN, BSN [phone] patient lives with children , NCM will continue to follow for dc needs.

## 2011-04-19 NOTE — Progress Notes (Signed)
ANTICOAGULATION CONSULT NOTE - Follow Up Consult  Pharmacy Consult for Coumadin Indication: atrial fibrillation  Allergies  Allergen Reactions  . Lorazepam (Ativan)   . Naproxen Sodium     REACTION: rash all over  . Niacin     REACTION: unspecified  . Ramipril     REACTION: syncope  . Sulfonamide Derivatives     REACTION: unspecified (no HCTZ allergy)  . Zolpidem Tartrate (Ambien)     Patient Measurements: Height: 5\' 3"  (160 cm) Weight: 176 lb 2.4 oz (79.9 kg) IBW/kg (Calculated) : 56.9  Heparin Dosing Weight:   Vital Signs: Temp: 98.2 F (36.8 C) (03/15 1025) Temp src: Oral (03/15 1025) BP: 145/80 mmHg (03/15 1025) Pulse Rate: 65  (03/15 1025)  Labs:  Basename 04/19/11 0530 04/18/11 1310 04/17/11 2045 04/17/11 2037  HGB 11.5* -- 15.1 --  HCT 34.2* -- 42.9 49.0  PLT 283 -- 391 --  APTT -- -- -- --  LABPROT 22.5* 23.0* -- --  INR 1.94* 2.00* -- --  HEPARINUNFRC -- -- -- --  CREATININE 1.22 -- -- 1.40*  CKTOTAL -- -- -- --  CKMB -- -- -- --  TROPONINI -- -- -- --   Estimated Creatinine Clearance: 42.1 ml/min (by C-G formula based on Cr of 1.22).  Assessment: 86yom continuing Coumadin for Afib. INR (1.94) is slightly below goal. PTA regimen: 2mg  daily except 1mg  on Mondays and Thursdays.  - H/H and Plts decreased - No significant bleeding reported  Goal of Therapy:  INR 2-3   Plan:  1. Continue home Coumadin regimen - 2mg  due today 2. Follow-up AM INR and discharge plans  Cleon Dew [phone] 04/19/2011,11:20 AM

## 2011-04-19 NOTE — Discharge Summary (Addendum)
Physician Discharge Summary  Patient ID: [name] MRN: [id number] DOB/AGE: 08-Aug-1924 78 y.o.  Admit date: 04/17/2011 Discharge date: 04/19/2011  Primary Care Physician:  Crawford Givens, MD, MD   Discharge Diagnoses:    Principal Problem:  *Gastroenteritis Active Problems:  DEMENTIA  URI (upper respiratory infection)  Dehydration  ARF (acute renal failure)    Medication List  As of 04/19/2011  3:13 PM   TAKE these medications         allopurinol 100 MG tablet   Commonly known as: ZYLOPRIM   Take 100 mg by mouth daily.      ALPRAZolam 0.25 MG tablet   Commonly known as: XANAX   Take 0.25 mg by mouth 2 (two) times daily.      CRESTOR 20 MG tablet   Generic drug: rosuvastatin   PLACE 1 TABLET INTO FEEDING TUBE ONCE DAILY      furosemide 40 MG tablet   Commonly known as: LASIX      memantine 10 MG tablet   Commonly known as: NAMENDA   Take 10 mg by mouth 2 (two) times daily.      metoprolol 50 MG tablet   Commonly known as: LOPRESSOR   Take 1.5 tablets (75 mg total) by mouth 2 (two) times daily.      pantoprazole 40 MG tablet   Commonly known as: PROTONIX      sertraline 50 MG tablet   Commonly known as: ZOLOFT   Take 75 mg by mouth daily.      warfarin 2 MG tablet   Commonly known as: COUMADIN   Take 1-2 mg by mouth See admin instructions. Take one-half pill (1mg ) on Monday and Thursday. Take 1 pill (2mg ) on Tuesday, Wednesday, Friday, Saturday, and Sunday.             Disposition and Follow-up:  Will be discharged today in stable and improved condition. Followup with PCP in 3 weeks.  Consults:  None    Significant Diagnostic Studies:  Dg Chest 2 View  04/17/2011  *RADIOLOGY REPORT*  Clinical Data: Cough  CHEST - 2 VIEW  Comparison: 09/16/2010  Findings: Normal heart size.  Bronchitic changes.  Clear lungs.  No pneumothorax and no pleural effusion.  IMPRESSION: No active cardiopulmonary disease.  Original Report Authenticated By: Donavan Burnet, M.D.   Ct Abdomen Pelvis W Contrast  04/18/2011  *RADIOLOGY REPORT*  Clinical Data: Abdominal pain  CT ABDOMEN AND PELVIS WITH CONTRAST  Technique:  Multidetector CT imaging of the abdomen and pelvis was performed following the standard protocol during bolus administration of intravenous contrast.  Contrast: OMNIPAQUE IOHEXOL 300 MG/ML IJ SOLN  Comparison: 09/21/2010 radiograph  Findings: Calcified right lower lobe nodule.  Coronary artery calcification.  Status post median sternotomy and CABG.  No pleural or pericardial effusion.  Low attenuation of the liver suggests fatty infiltration. Scattered calcifications in keeping with prior granulomas infection.  Unremarkable spleen, pancreas, adrenal glands.  Status post cholecystectomy.  No biliary ductal dilatation.  Symmetric renal enhancement.  No hydronephrosis or hydroureter.  No urinary tract calculi identified.  Colonic diverticulosis.  No CT evidence for diverticulitis.  No bowel obstruction.  Normal appendix.  No free intraperitoneal air or fluid.  No lymphadenopathy.  There is scattered atherosclerotic calcification of the aorta and its branches. No aneurysmal dilatation.  Circumferential bladder wall thickening, nonspecific given incomplete distension.  Multilevel degenerative changes of the imaged spine. No acute or aggressive appearing osseous lesion.  IMPRESSION: No acute  CT abnormality identified.  Hepatic steatosis.  Sequelae of prior granulomas infection.  Original Report Authenticated By: Waneta Martins, M.D.    Brief H and P: For complete details please refer to admission H and P, but in brief patient is an 78 yo man brought in by his son with complaint of NVD for 2 days. His son lives with him and had recent respiratory tract infection that they both ended up having. He still has cough which is non productive. He now noticed that he was having some abdominal discomfort followed by Nausea and episodes of vomiting and 2 episodes  of Diarrhea. His work up in the ER shows evidence of dehydration, ARF and possibly gastroenteritis. We are asked to admit him for further evaluation and management.     Hospital Course:  Principal Problem:  *Gastroenteritis Active Problems:  DEMENTIA  URI (upper respiratory infection)  Dehydration  ARF (acute renal failure)   #1 Viral Gastroenteritis: Resolved. No further n/v/diarrhea/abdominal pain.  #2 ARF: resolved.  Stable for discharge home today.  Time spent on Discharge: Greater than 30 minutes.  SignedChaya Jan [facility] Pager: [phone] 04/19/2011, 3:13 PM

## 2011-04-24 ENCOUNTER — Telehealth: Payer: Self-pay | Admitting: Family Medicine

## 2011-04-24 NOTE — Telephone Encounter (Signed)
Agreed -

## 2011-04-24 NOTE — Telephone Encounter (Signed)
[facility] Triage Call Report Triage Record Num: [id number] Operator: Estevan Oaks Patient Name: Baylen Buckner Call Date & Time: 04/24/2011 5:37:04PM Patient Phone: [phone] PCP: Crawford Givens Patient Gender: Male PCP Fax : Patient DOB: 1924-10-01 Practice Name: [facility] Reason for Call: Caller: Gary/Other; PCP: Eustaquio Boyden; CB#: [phone]; Call regarding Cough/Congestion. Afebrile. Onset sx's "about last week." Denies breathing diff, denies CP. [name], son wants to know if okay to give Tylenol since he can't take [facility] due to anticoag treatment for AFIB. Cough, all ER sx's r/o. Adv'd home care and discussed call back parameters per guideline. May give Tylenol for discomforts. Protocol(s) Used: Cough - Adult Recommended Outcome per Protocol: Provide Home/Self Care Reason for Outcome: New onset of two or more of the following symptoms: nasal congestion with runny nose; sneezing; itchy or mild sore throat; mild headache or body aches; mild fatigue; low grade fever up to 101.5 F (38.6C) usually lasting about a week Care Advice: ~ Use a cool mist humidifier to moisten air. Be sure to clean according to manufacturer's instructions. ~ Call provider for evaluation of cough that lasts 2 weeks or more. Sore Throat Relief: - Use warm salt water gargles 3 to 4 times/day, as needed (1/2 tsp. salt in 8 oz. [.2 liters] water). - Suck on hard candy, nonprescription or herbal throat lozenges (sugar-free if diabetic) - Eat soothing, soft food/fluids (broths, soups, or honey and lemon juice in hot tea, Popsicles, frozen yogurt or sherbet, scrambled eggs, cooked cereals, Jell-O or puddings) whichever is most comforting. - Avoid eating salty, spicy or acidic foods. ~ 03

## 2011-05-06 ENCOUNTER — Ambulatory Visit (INDEPENDENT_AMBULATORY_CARE_PROVIDER_SITE_OTHER): Payer: Medicare Other | Admitting: Family Medicine

## 2011-05-06 DIAGNOSIS — Z5181 Encounter for therapeutic drug level monitoring: Secondary | ICD-10-CM

## 2011-05-06 DIAGNOSIS — I4891 Unspecified atrial fibrillation: Secondary | ICD-10-CM

## 2011-05-06 DIAGNOSIS — Z7901 Long term (current) use of anticoagulants: Secondary | ICD-10-CM

## 2011-05-06 NOTE — Patient Instructions (Signed)
Continue 2 mg daily except 1 mg Mon, Thurs, recheck 4 weeks

## 2011-05-28 ENCOUNTER — Other Ambulatory Visit: Payer: Self-pay | Admitting: Family Medicine

## 2011-05-28 DIAGNOSIS — I10 Essential (primary) hypertension: Secondary | ICD-10-CM

## 2011-05-29 NOTE — Telephone Encounter (Signed)
rxs sent.  Needs 30 min OV this summer with fasting labs ahead of time.

## 2011-05-29 NOTE — Telephone Encounter (Signed)
Patient's son [name]) notified as instructed by telephone. Was informed that [name] will have his sister get these appointments scheduled the next time that she brings him to the office to have his protime checked.

## 2011-05-29 NOTE — Telephone Encounter (Signed)
Received refill request electronically from pharmacy. Please verify directions on medications? Is it okay to refill medications.

## 2011-05-30 ENCOUNTER — Other Ambulatory Visit: Payer: Self-pay | Admitting: Family Medicine

## 2011-05-31 NOTE — Telephone Encounter (Signed)
Medication phoned to pharmacy.  

## 2011-05-31 NOTE — Telephone Encounter (Signed)
Electronic refill request

## 2011-05-31 NOTE — Telephone Encounter (Signed)
Please call in

## 2011-06-03 ENCOUNTER — Ambulatory Visit (INDEPENDENT_AMBULATORY_CARE_PROVIDER_SITE_OTHER): Payer: Medicare Other | Admitting: Family Medicine

## 2011-06-03 DIAGNOSIS — I4891 Unspecified atrial fibrillation: Secondary | ICD-10-CM

## 2011-06-03 DIAGNOSIS — Z7901 Long term (current) use of anticoagulants: Secondary | ICD-10-CM

## 2011-06-03 DIAGNOSIS — Z5181 Encounter for therapeutic drug level monitoring: Secondary | ICD-10-CM

## 2011-06-03 NOTE — Patient Instructions (Signed)
Continue current dose, check in 4 weeks  

## 2011-06-10 ENCOUNTER — Ambulatory Visit (INDEPENDENT_AMBULATORY_CARE_PROVIDER_SITE_OTHER): Payer: Medicare Other | Admitting: Family Medicine

## 2011-06-10 ENCOUNTER — Encounter: Payer: Self-pay | Admitting: Family Medicine

## 2011-06-10 ENCOUNTER — Ambulatory Visit: Payer: Medicare Other | Admitting: Family Medicine

## 2011-06-10 VITALS — BP 134/54 | HR 75 | Temp 98.5°F | Wt 180.0 lb

## 2011-06-10 DIAGNOSIS — F068 Other specified mental disorders due to known physiological condition: Secondary | ICD-10-CM

## 2011-06-10 DIAGNOSIS — E785 Hyperlipidemia, unspecified: Secondary | ICD-10-CM

## 2011-06-10 DIAGNOSIS — M109 Gout, unspecified: Secondary | ICD-10-CM

## 2011-06-10 DIAGNOSIS — I4891 Unspecified atrial fibrillation: Secondary | ICD-10-CM

## 2011-06-10 DIAGNOSIS — I10 Essential (primary) hypertension: Secondary | ICD-10-CM

## 2011-06-10 DIAGNOSIS — Z7189 Other specified counseling: Secondary | ICD-10-CM

## 2011-06-10 LAB — URIC ACID: Uric Acid, Serum: 6.7 mg/dL (ref 4.0–7.8)

## 2011-06-10 LAB — LIPID PANEL
Cholesterol: 136 mg/dL (ref 0–200)
HDL: 42.4 mg/dL (ref >39.00)
LDL Cholesterol: 58 mg/dL (ref 0–99)
Triglycerides: 176 mg/dL — ABNORMAL HIGH (ref 0.0–149.0)

## 2011-06-10 LAB — COMPREHENSIVE METABOLIC PANEL
BUN: 16 mg/dL (ref 6–23)
CO2: 32 mEq/L (ref 19–32)
Calcium: 8.9 mg/dL (ref 8.4–10.5)
Chloride: 97 mEq/L (ref 96–112)
Creatinine, Ser: 1.3 mg/dL (ref 0.4–1.5)
GFR: 56.85 mL/min — ABNORMAL LOW (ref >60.00)

## 2011-06-10 NOTE — Patient Instructions (Signed)
Talk with [name] about your living will.  Go to the lab on the way out.  We'll contact you with your lab report. Take care.  Don't change your meds.   Check with your insurance to see if they will cover the shingles shot.

## 2011-06-10 NOTE — Progress Notes (Signed)
H/o dementia.  Not driving.  Not cooking.  Feeding self, getting up and to bathroom.  No falls.  Living with his son.  Swallowing well.   Orientation: not oriented to year, fall, month.   Can read a watch face.  0/3 on recall.   Mood has been good.  "I'm as mean as ever,"  but he was kidding about that, smiling and happy appearing.   He has some crackers but o/w is fasting.  Labs are pending.  We discussed.   Elevated Cholesterol: Using medications without problems:yes Muscle aches: no Diet compliance: yes Exercise:yes, on the exercise bike  AF.  On coumadin, no falls.  No palpitation.  No swelling in legs.    Hypertension:    Using medication without problems or lightheadedness: yes Chest pain with exertion:no Edema:no Short of breath:no  Gout, no flares, no ADE, doing well.    Has a living will.    Meds, vitals, and allergies reviewed.   ROS: See HPI.  Otherwise, noncontributory.  nad ncat Mmm IRR, not tachy ctab Ext w/o edema

## 2011-06-11 ENCOUNTER — Encounter: Payer: Self-pay | Admitting: *Deleted

## 2011-06-13 DIAGNOSIS — Z7189 Other specified counseling: Secondary | ICD-10-CM | POA: Insufficient documentation

## 2011-06-13 NOTE — Assessment & Plan Note (Signed)
Doing well w/o flare recently.

## 2011-06-13 NOTE — Assessment & Plan Note (Signed)
Continue current meds, work on diet for TGs.

## 2011-06-13 NOTE — Assessment & Plan Note (Signed)
Controlled Continue current meds 

## 2011-06-13 NOTE — Assessment & Plan Note (Signed)
Pleasant and in stable home environment.  Continue with current meds.  Appears to be safe at home.

## 2011-06-13 NOTE — Assessment & Plan Note (Signed)
Continue warfarin and current meds.  Doing well.  Rate controlled.

## 2011-06-17 ENCOUNTER — Other Ambulatory Visit: Payer: Self-pay | Admitting: Family Medicine

## 2011-07-02 ENCOUNTER — Ambulatory Visit (INDEPENDENT_AMBULATORY_CARE_PROVIDER_SITE_OTHER): Payer: Medicare Other | Admitting: Family Medicine

## 2011-07-02 DIAGNOSIS — Z7901 Long term (current) use of anticoagulants: Secondary | ICD-10-CM

## 2011-07-02 DIAGNOSIS — I4891 Unspecified atrial fibrillation: Secondary | ICD-10-CM

## 2011-07-02 DIAGNOSIS — Z5181 Encounter for therapeutic drug level monitoring: Secondary | ICD-10-CM

## 2011-07-02 LAB — POCT INR: INR: 2.4

## 2011-07-02 NOTE — Patient Instructions (Signed)
Continue 2 mg daily except 1 mg Mon, Thurs, recheck 4 weeks 

## 2011-07-13 ENCOUNTER — Encounter (HOSPITAL_COMMUNITY): Payer: Self-pay | Admitting: Physical Medicine and Rehabilitation

## 2011-07-13 ENCOUNTER — Emergency Department (HOSPITAL_COMMUNITY): Payer: Medicare Other

## 2011-07-13 ENCOUNTER — Inpatient Hospital Stay (HOSPITAL_COMMUNITY)
Admission: EM | Admit: 2011-07-13 | Discharge: 2011-07-15 | DRG: 065 | Disposition: A | Discharge status: Home / Self Care | Payer: Medicare Other | Attending: Internal Medicine | Admitting: Internal Medicine

## 2011-07-13 DIAGNOSIS — I509 Heart failure, unspecified: Secondary | ICD-10-CM | POA: Diagnosis present

## 2011-07-13 DIAGNOSIS — F0390 Unspecified dementia without behavioral disturbance: Secondary | ICD-10-CM

## 2011-07-13 DIAGNOSIS — I639 Cerebral infarction, unspecified: Secondary | ICD-10-CM

## 2011-07-13 DIAGNOSIS — G459 Transient cerebral ischemic attack, unspecified: Secondary | ICD-10-CM

## 2011-07-13 DIAGNOSIS — R791 Abnormal coagulation profile: Secondary | ICD-10-CM | POA: Diagnosis present

## 2011-07-13 DIAGNOSIS — I10 Essential (primary) hypertension: Secondary | ICD-10-CM | POA: Diagnosis present

## 2011-07-13 DIAGNOSIS — I5022 Chronic systolic (congestive) heart failure: Secondary | ICD-10-CM | POA: Diagnosis present

## 2011-07-13 DIAGNOSIS — Z66 Do not resuscitate: Secondary | ICD-10-CM | POA: Diagnosis present

## 2011-07-13 DIAGNOSIS — F411 Generalized anxiety disorder: Secondary | ICD-10-CM | POA: Diagnosis present

## 2011-07-13 DIAGNOSIS — R4701 Aphasia: Secondary | ICD-10-CM

## 2011-07-13 DIAGNOSIS — I4891 Unspecified atrial fibrillation: Secondary | ICD-10-CM

## 2011-07-13 DIAGNOSIS — Z7901 Long term (current) use of anticoagulants: Secondary | ICD-10-CM

## 2011-07-13 DIAGNOSIS — Z79899 Other long term (current) drug therapy: Secondary | ICD-10-CM

## 2011-07-13 DIAGNOSIS — M109 Gout, unspecified: Secondary | ICD-10-CM | POA: Diagnosis present

## 2011-07-13 DIAGNOSIS — Z951 Presence of aortocoronary bypass graft: Secondary | ICD-10-CM

## 2011-07-13 DIAGNOSIS — I6350 Cerebral infarction due to unspecified occlusion or stenosis of unspecified cerebral artery: Principal | ICD-10-CM | POA: Diagnosis present

## 2011-07-13 DIAGNOSIS — R1310 Dysphagia, unspecified: Secondary | ICD-10-CM | POA: Diagnosis present

## 2011-07-13 DIAGNOSIS — Z85828 Personal history of other malignant neoplasm of skin: Secondary | ICD-10-CM

## 2011-07-13 DIAGNOSIS — I2510 Atherosclerotic heart disease of native coronary artery without angina pectoris: Secondary | ICD-10-CM | POA: Diagnosis present

## 2011-07-13 DIAGNOSIS — Z87891 Personal history of nicotine dependence: Secondary | ICD-10-CM

## 2011-07-13 DIAGNOSIS — E785 Hyperlipidemia, unspecified: Secondary | ICD-10-CM | POA: Diagnosis present

## 2011-07-13 DIAGNOSIS — I6340 Cerebral infarction due to embolism of unspecified cerebral artery: Secondary | ICD-10-CM

## 2011-07-13 HISTORY — DX: Cerebral infarction, unspecified: I63.9

## 2011-07-13 LAB — DIFFERENTIAL
Basophils Absolute: 0 10*3/uL (ref 0.0–0.1)
Basophils Relative: 0 % (ref 0–1)
Eosinophils Relative: 5 % (ref 0–5)
Monocytes Absolute: 0.4 10*3/uL (ref 0.1–1.0)

## 2011-07-13 LAB — CK TOTAL AND CKMB (NOT AT ARMC)
CK, MB: 2.8 ng/mL (ref 0.3–4.0)
Relative Index: INVALID (ref 0.0–2.5)

## 2011-07-13 LAB — POCT I-STAT, CHEM 8
BUN: 16 mg/dL (ref 6–23)
Chloride: 101 mEq/L (ref 96–112)
Creatinine, Ser: 1.2 mg/dL (ref 0.50–1.35)
Sodium: 141 mEq/L (ref 135–145)
TCO2: 27 mmol/L (ref 0–100)

## 2011-07-13 LAB — GLUCOSE, CAPILLARY
Glucose-Capillary: 109 mg/dL — ABNORMAL HIGH (ref 70–99)
Glucose-Capillary: 128 mg/dL — ABNORMAL HIGH (ref 70–99)

## 2011-07-13 LAB — CBC
HCT: 35.7 % — ABNORMAL LOW (ref 39.0–52.0)
MCH: 30.4 pg (ref 26.0–34.0)
MCHC: 33.3 g/dL (ref 30.0–36.0)
MCV: 91.3 fL (ref 78.0–100.0)
RDW: 16.1 % — ABNORMAL HIGH (ref 11.5–15.5)

## 2011-07-13 LAB — COMPREHENSIVE METABOLIC PANEL
AST: 22 U/L (ref 0–37)
Albumin: 3.1 g/dL — ABNORMAL LOW (ref 3.5–5.2)
CO2: 27 mEq/L (ref 19–32)
Calcium: 8.7 mg/dL (ref 8.4–10.5)
Creatinine, Ser: 1.18 mg/dL (ref 0.50–1.35)
GFR calc non Af Amer: 55 mL/min — ABNORMAL LOW (ref >90)

## 2011-07-13 MED ORDER — WARFARIN SODIUM 2.5 MG PO TABS
2.5000 mg | ORAL_TABLET | Freq: Once | ORAL | Status: AC
  Administered 2011-07-13: 2.5 mg via ORAL
  Filled 2011-07-13: qty 1

## 2011-07-13 MED ORDER — ALLOPURINOL 100 MG PO TABS
100.0000 mg | ORAL_TABLET | Freq: Every day | ORAL | Status: DC
  Administered 2011-07-14 – 2011-07-15 (×2): 100 mg via ORAL
  Filled 2011-07-13 (×2): qty 1

## 2011-07-13 MED ORDER — PANTOPRAZOLE SODIUM 40 MG PO TBEC
40.0000 mg | DELAYED_RELEASE_TABLET | Freq: Two times a day (BID) | ORAL | Status: DC
  Administered 2011-07-13 – 2011-07-15 (×4): 40 mg via ORAL
  Filled 2011-07-13 (×2): qty 1

## 2011-07-13 MED ORDER — FUROSEMIDE 20 MG PO TABS
20.0000 mg | ORAL_TABLET | Freq: Every day | ORAL | Status: DC
  Administered 2011-07-14 – 2011-07-15 (×2): 20 mg via ORAL
  Filled 2011-07-13 (×2): qty 1

## 2011-07-13 MED ORDER — WARFARIN - PHARMACIST DOSING INPATIENT: Freq: Every day | Status: DC

## 2011-07-13 MED ORDER — MEMANTINE HCL 10 MG PO TABS
10.0000 mg | ORAL_TABLET | Freq: Two times a day (BID) | ORAL | Status: DC
  Administered 2011-07-14 – 2011-07-15 (×3): 10 mg via ORAL
  Filled 2011-07-13 (×4): qty 1

## 2011-07-13 MED ORDER — SERTRALINE HCL 50 MG PO TABS
75.0000 mg | ORAL_TABLET | Freq: Every day | ORAL | Status: DC
  Administered 2011-07-14 – 2011-07-15 (×2): 75 mg via ORAL
  Filled 2011-07-13 (×2): qty 1

## 2011-07-13 MED ORDER — ATORVASTATIN CALCIUM 20 MG PO TABS
20.0000 mg | ORAL_TABLET | Freq: Every day | ORAL | Status: DC
  Administered 2011-07-13 – 2011-07-14 (×2): 20 mg via ORAL
  Filled 2011-07-13 (×3): qty 1

## 2011-07-13 MED ORDER — ALPRAZOLAM 0.25 MG PO TABS
0.2500 mg | ORAL_TABLET | Freq: Two times a day (BID) | ORAL | Status: DC | PRN
  Administered 2011-07-13 – 2011-07-14 (×2): 0.25 mg via ORAL
  Filled 2011-07-13 (×2): qty 1

## 2011-07-13 MED ORDER — METOPROLOL TARTRATE 50 MG PO TABS
75.0000 mg | ORAL_TABLET | Freq: Two times a day (BID) | ORAL | Status: DC
  Administered 2011-07-13 – 2011-07-15 (×4): 75 mg via ORAL
  Filled 2011-07-13 (×5): qty 1

## 2011-07-13 MED ORDER — ASPIRIN 325 MG PO TABS
325.0000 mg | ORAL_TABLET | Freq: Every day | ORAL | Status: DC
  Administered 2011-07-13 – 2011-07-15 (×3): 325 mg via ORAL
  Filled 2011-07-13 (×2): qty 1

## 2011-07-13 NOTE — ED Notes (Signed)
Pt returned to exam room. No change in neurological status, he remains alert and is responding appropriately per normal baseline. Family at bedside. Pt remains on cardiac monitor. Vital signs stable. Denies pain at present.

## 2011-07-13 NOTE — ED Notes (Signed)
Dr. Thad Ranger at the bedside to perform neuro exam. Pt resting quietly. Vital signs stable. Family also at bedside.

## 2011-07-13 NOTE — ED Notes (Signed)
Pt transported to MRI 

## 2011-07-13 NOTE — ED Notes (Signed)
Pt and family denies history of dysphagia. He did pass swallow screen without difficulty.

## 2011-07-13 NOTE — ED Notes (Signed)
CBG: 115 

## 2011-07-13 NOTE — Progress Notes (Signed)
07/13/11 1227  OTHER  CSW Follow Up Status Follow-up required

## 2011-07-13 NOTE — ED Notes (Signed)
Pt being re-assigned to camera room on unit. Nurse to call back.

## 2011-07-13 NOTE — Consult Note (Signed)
Referring Physician: Hosmer    Chief Complaint: Difficulty with speech  HPI: [name] is an 78 y.o. male who went to bed last night and was felt to be normal at that time.  Today on awakening was having difficulty getting his thoughts out.  Words were not understandable at times.  Also seemed to have difficulty with his usual ADL''s such as figuring out how to get his pants unbuckled to use the bathroom.  Symptoms did not clear and patient was brought to the ED for further evaluation.  Code stroke was called.   Patient is on Coumadin at home due to atrial fibrillation.  Lives with son due to dementia.    LSN: 07/12/11 tPA Given: No: Outside time window  Past Medical History  Diagnosis Date  . Anxiety   . Hypertension   . Hyperlipidemia   . CAD (coronary artery disease)   . Dementia   . Gout, unspecified 2012    crystal analysis at urgent care  . AF (atrial fibrillation)   . 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 Cancer 02/25/11    Squamous Cell Carcinoma- Left Dorsum Hand    Past Surgical History  Procedure Date  . Cholecystectomy   . Endarterectomy 1996    Bilateral (2 weeks apart)  . Skin cancer removal 06/2007    (? basal cell)  . 4 vessel cabg 06/97  . Carotid endarterectomy 03/11/02  . Mri brain 02/23/10    No acute abnormality Chronic Cerebral Ischemia Stable Ventr Prominence due to Volume loss  . Pseudoaneurysm repair 08/26/10    Right carotid endarterectomy pseudoaneurysm  . Peg placement     Family History  Problem Relation Age of Onset  . Colon cancer Sister   . Hypertension Brother   . Cancer Brother     intestine  . Brain cancer Brother   . Melanoma Sister   . Coronary artery disease Son     48, heart attack   Social History:  reports that he has quit smoking. His smoking use included Cigarettes. He has never used smokeless tobacco. He reports that he does not drink alcohol or use illicit drugs.  Allergies:  Allergies   Allergen Reactions  . Lorazepam (Lorazepam)   . Naproxen Sodium     REACTION: rash all over  . Niacin     REACTION: unspecified  . Ramipril     REACTION: syncope  . Sulfonamide Derivatives     REACTION: unspecified (no HCTZ allergy)  . Zolpidem Tartrate (Zolpidem Tartrate)     Medications: I have reviewed the patient's current medications. Prior to Admission:  Allopurinol, Xanax, Lasix, Namenda, Lopressor, Protonix, Crestor, Zoloft, Coumadin  ROS: History obtained from the patient  General ROS: negative for - chills, fatigue, fever, night sweats, weight gain or weight loss Psychological ROS: negative for - behavioral disorder, hallucinations, memory difficulties, mood swings or suicidal ideation Ophthalmic ROS: negative for - blurry vision, double vision, eye pain or loss of vision ENT ROS: negative for - epistaxis, nasal discharge, oral lesions, sore throat, tinnitus or vertigo Allergy and Immunology ROS: negative for - hives or itchy/watery eyes Hematological and Lymphatic ROS: negative for - bleeding problems, bruising or swollen lymph nodes Endocrine ROS: negative for - galactorrhea, hair pattern changes, polydipsia/polyuria or temperature intolerance Respiratory ROS: negative for - cough, hemoptysis, shortness of breath or wheezing Cardiovascular ROS: negative for - chest pain, dyspnea on exertion, edema or irregular heartbeat Gastrointestinal ROS: negative for -  abdominal pain, diarrhea, hematemesis, nausea/vomiting or stool incontinence Genito-Urinary ROS: negative for - dysuria, hematuria, incontinence or urinary frequency/urgency Musculoskeletal ROS: negative for - joint swelling or muscular weakness Neurological ROS: as noted in HPI Dermatological ROS: negative for rash and skin lesion changes  Physical Examination: Blood pressure 140/58, pulse 76, temperature 98.3 F (36.8 C), temperature source Oral, resp. rate 20, SpO2 98.00%.  Neurologic Examination: Mental  Status: Alert, oriented, thought content appropriate.  Speech non-fluent.  Unable to name.  Able to follow 3 step commands without difficulty. Cranial Nerves: II: visual fields grossly normal, pupils equal, round, reactive to light and accommodation III,IV, VI: ptosis not present, extra-ocular motions intact bilaterally V,VII: smile symmetric, facial light touch sensation normal bilaterally VIII: hearing normal bilaterally IX,X: gag reflex present XI: trapezius strength/neck flexion strength normal bilaterally XII: tongue strength normal  Motor: Right : Upper extremity   5/5    Left:     Upper extremity   5/5  Lower extremity   5/5     Lower extremity   5/5 Tone and bulk:normal tone throughout; no atrophy noted Sensory: Pinprick and light touch intact throughout, bilaterally Deep Tendon Reflexes: 1+ LUE, trace RUE, trace KJ's and 1+ AJ's Plantars: Right: upgoing   Left: upgoing Cerebellar: normal finger-to-nose and normal heel-to-shin test   Results for orders placed during the hospital encounter of 07/13/11 (from the past 48 hour(s))  PROTIME-INR     Status: Abnormal   Collection Time   07/13/11 10:03 AM      Component Value Range Comment   Prothrombin Time 18.7 (*) 11.6 - 15.2 (seconds)    INR 1.53 (*) 0.00 - 1.49    APTT     Status: Normal   Collection Time   07/13/11 10:03 AM      Component Value Range Comment   aPTT 37  24 - 37 (seconds)   CBC     Status: Abnormal   Collection Time   07/13/11 10:03 AM      Component Value Range Comment   WBC 7.2  4.0 - 10.5 (K/uL)    RBC 3.91 (*) 4.22 - 5.81 (MIL/uL)    Hemoglobin 11.9 (*) 13.0 - 17.0 (g/dL)    HCT 16.1 (*) 09.6 - 52.0 (%)    MCV 91.3  78.0 - 100.0 (fL)    MCH 30.4  26.0 - 34.0 (pg)    MCHC 33.3  30.0 - 36.0 (g/dL)    RDW 04.5 (*) 40.9 - 15.5 (%)    Platelets 201  150 - 400 (K/uL)   DIFFERENTIAL     Status: Normal   Collection Time   07/13/11 10:03 AM      Component Value Range Comment   Neutrophils Relative 72  43 -  77 (%)    Neutro Abs 5.2  1.7 - 7.7 (K/uL)    Lymphocytes Relative 17  12 - 46 (%)    Lymphs Abs 1.2  0.7 - 4.0 (K/uL)    Monocytes Relative 6  3 - 12 (%)    Monocytes Absolute 0.4  0.1 - 1.0 (K/uL)    Eosinophils Relative 5  0 - 5 (%)    Eosinophils Absolute 0.4  0.0 - 0.7 (K/uL)    Basophils Relative 0  0 - 1 (%)    Basophils Absolute 0.0  0.0 - 0.1 (K/uL)   POCT I-STAT, CHEM 8     Status: Abnormal   Collection Time   07/13/11 10:20 AM  Component Value Range Comment   Sodium 141  135 - 145 (mEq/L)    Potassium 3.6  3.5 - 5.1 (mEq/L)    Chloride 101  96 - 112 (mEq/L)    BUN 16  6 - 23 (mg/dL)    Creatinine, Ser 4.54  0.50 - 1.35 (mg/dL)    Glucose, Bld 098 (*) 70 - 99 (mg/dL)    Calcium, Ion 1.19  1.12 - 1.32 (mmol/L)    TCO2 27  0 - 100 (mmol/L)    Hemoglobin 12.2 (*) 13.0 - 17.0 (g/dL)    HCT 14.7 (*) 82.9 - 52.0 (%)    Ct Head Wo Contrast  07/13/2011  *RADIOLOGY REPORT*  Clinical Data: Code stroke  CT HEAD WITHOUT CONTRAST  Technique:  Contiguous axial images were obtained from the base of the skull through the vertex without contrast.  Comparison: 11/ 01/2007 and MRI 02/23/10  Findings: No skull fracture is noted.  There is mucosal thickening bilateral sphenoid sinuses.  The mastoid air cells are unremarkable.  No intracranial hemorrhage, mass effect or midline shift.  Stable mild cerebral atrophy.  Stable small lacunar infarct in the left posterior cerebellum  No acute infarction.  No mass lesion is noted on this unenhanced scan.  IMPRESSION:  1.  No acute intracranial abnormality.  No definite acute cortical infarction. 2.  Stable cerebral atrophy. 3.  Mild mucosal thickening bilateral sphenoid sinus. 4.  Stable small lacunar infarct left posterior cerebellum.  Original Report Authenticated By: Natasha Mead, M.D.    Assessment: 78 y.o. male with history of atrial fibrillation, subtherapeutic on Coumadin, who presents with an expressive aphasia.  CT shows no evidence of acute changes  although small branch left MCA ischemic event is suspected.    Stroke Risk Factors - atrial fibrillation, hyperlipidemia and hypertension  Plan: 1. HgbA1c, fasting lipid panel 2. MRI, MRA  of the brain without contrast 3. Speech consult, OT consult 4. Echocardiogram 5. Carotid dopplers 6. Prophylactic therapy-Anticoagulation: Coumadin- dose to be determined by pharmacy.  May bridge with ASA.  INR target between 2 and 3. 7. Risk factor modification 8. Telemetry monitoring 9. Frequent neuro checks   Thana Farr, MD [facility] [phone] 07/13/2011, 10:36 AM

## 2011-07-13 NOTE — Progress Notes (Signed)
ANTICOAGULATION CONSULT NOTE - Initial Consult  Pharmacy Consult for Warfarin Indication: Atrial fibrillation  Allergies  Allergen Reactions  . Lorazepam (Lorazepam)   . Naproxen Sodium     REACTION: rash all over  . Niacin     REACTION: unspecified  . Ramipril     REACTION: syncope  . Sulfonamide Derivatives     REACTION: unspecified (no HCTZ allergy)  . Zolpidem Tartrate (Zolpidem Tartrate)       Vital Signs: Temp: 98.8 F (37.1 C) (06/08 1404) Temp src: Oral (06/08 1404) BP: 148/61 mmHg (06/08 1404) Pulse Rate: 64  (06/08 1404)  Labs:  Basename 07/13/11 1020 07/13/11 1004 07/13/11 1003  HGB 12.2* -- 11.9*  HCT 36.0* -- 35.7*  PLT -- -- 201  APTT -- -- 37  LABPROT -- -- 18.7*  INR -- -- 1.53*  HEPARINUNFRC -- -- --  CREATININE 1.20 -- 1.18  CKTOTAL -- 93 --  CKMB -- 2.8 --  TROPONINI -- <0.30 --    The CrCl is unknown because both a height and weight (above a minimum accepted value) are required for this calculation.   Medical History: Past Medical History  Diagnosis Date  . Anxiety   . Hypertension   . Hyperlipidemia   . CAD (coronary artery disease)   . Dementia   . Gout, unspecified 2012    crystal analysis at urgent care  . AF (atrial fibrillation)   . 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 Cancer 02/25/11    Squamous Cell Carcinoma- Left Dorsum Hand    Medications:  Prescriptions prior to admission  Medication Sig Dispense Refill  . allopurinol (ZYLOPRIM) 100 MG tablet Take 100 mg by mouth daily.      Marland Kitchen ALPRAZolam (XANAX) 0.25 MG tablet take 1 tablet by mouth twice a day  60 tablet  3  . furosemide (LASIX) 40 MG tablet Take 0.5 tablets (20 mg total) by mouth daily.  30 tablet  5  . memantine (NAMENDA) 10 MG tablet Take 10 mg by mouth 2 (two) times daily.      . metoprolol (LOPRESSOR) 50 MG tablet Take 1.5 tablets (75 mg total) by mouth 2 (two) times daily.      . pantoprazole (PROTONIX) 40 MG tablet take 1  tablet by mouth twice a day  60 tablet  5  . rosuvastatin (CRESTOR) 20 MG tablet Take 1 tablet (20 mg total) by mouth daily.  30 tablet  5  . sertraline (ZOLOFT) 50 MG tablet Take 75 mg by mouth daily.      Marland Kitchen warfarin (COUMADIN) 2 MG tablet Take 1-2 mg by mouth See admin instructions. Take one-half pill (1mg ) on Monday and Thursday. Take 1 pill (2mg ) on Tuesday, Wednesday, Friday, Saturday, and Sunday.        Assessment: 78 year old man admitted for CVA to continue on warfarin.  INR subtherapeutic on admission at 1.53.  Home dose is 1mg  on Mon/Tue and 2mg  other days Goal of Therapy:  INR 2-3    Plan:  Coumadin 2.5mg  x 1 dose today. Daily protimes.  Mickeal Skinner 07/13/2011,2:26 PM

## 2011-07-13 NOTE — H&P (Signed)
PCP:   Crawford Givens, MD, MD    Chief Complaint:  Speech problems, not acting right  HPI: [name] is an 78 y.o. male with h/o dementia, CAD, afib on coumadin was in his usual state of health when he went to bed last pm. Today on awakening was having difficulty getting his thoughts out. Words were not understandable at times. Also seemed to have difficulty with his usual ADL''s such as figuring out how to get his pants unbuckled to use the bathroom. Symptoms did not clear and patient was brought to the ED for further evaluation by his son. Code stroke was called, his symptoms have since resolved.  Allergies:   Allergies  Allergen Reactions  . Lorazepam (Lorazepam)   . Naproxen Sodium     REACTION: rash all over  . Niacin     REACTION: unspecified  . Ramipril     REACTION: syncope  . Sulfonamide Derivatives     REACTION: unspecified (no HCTZ allergy)  . Zolpidem Tartrate (Zolpidem Tartrate)       Past Medical History  Diagnosis Date  . Anxiety   . Hypertension   . Hyperlipidemia   . CAD (coronary artery disease) s/p CABG   . Dementia   . Gout, unspecified 2012    crystal analysis at urgent care  . AF (atrial fibrillation)   . 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 Cancer 02/25/11    Squamous Cell Carcinoma- Left Dorsum Hand  carotid artery disease s/p B/L CEA  Past Surgical History  Procedure Date  . Cholecystectomy   . Endarterectomy 1996    Bilateral (2 weeks apart)  . Skin cancer removal 06/2007    (? basal cell)  . 4 vessel cabg 06/97  . Carotid endarterectomy 03/11/02  . Mri brain 02/23/10    No acute abnormality Chronic Cerebral Ischemia Stable Ventr Prominence due to Volume loss  . Pseudoaneurysm repair 08/26/10    Right carotid endarterectomy pseudoaneurysm  . Peg placement     Prior to Admission medications   Medication Sig Start Date End Date Taking? Authorizing Provider  allopurinol (ZYLOPRIM) 100 MG tablet Take  100 mg by mouth daily. 10/03/10  Yes Joaquim Nam, MD  ALPRAZolam Prudy Feeler) 0.25 MG tablet take 1 tablet by mouth twice a day 05/30/11  Yes Joaquim Nam, MD  furosemide (LASIX) 40 MG tablet Take 0.5 tablets (20 mg total) by mouth daily. 05/28/11  Yes Joaquim Nam, MD  memantine (NAMENDA) 10 MG tablet Take 10 mg by mouth 2 (two) times daily. 10/03/10  Yes Joaquim Nam, MD  metoprolol (LOPRESSOR) 50 MG tablet Take 1.5 tablets (75 mg total) by mouth 2 (two) times daily. 10/03/10  Yes Joaquim Nam, MD  pantoprazole (PROTONIX) 40 MG tablet take 1 tablet by mouth twice a day 05/28/11  Yes Joaquim Nam, MD  rosuvastatin (CRESTOR) 20 MG tablet Take 1 tablet (20 mg total) by mouth daily. 05/28/11  Yes Joaquim Nam, MD  sertraline (ZOLOFT) 50 MG tablet Take 75 mg by mouth daily. 10/03/10  Yes Joaquim Nam, MD  warfarin (COUMADIN) 2 MG tablet Take 1-2 mg by mouth See admin instructions. Take one-half pill (1mg ) on Monday and Thursday. Take 1 pill (2mg ) on Tuesday, Wednesday, Friday, Saturday, and Sunday.   Yes Historical Provider, MD    Social History:  reports that he has quit smoking. His smoking use included Cigarettes. He has never used smokeless tobacco.  He reports that he does not drink alcohol or use illicit drugs.  Family History  Problem Relation Age of Onset  . Colon cancer Sister   . Hypertension Brother   . Cancer Brother     intestine  . Brain cancer Brother   . Melanoma Sister   . Coronary artery disease Son     53, heart attack    Review of Systems:  Constitutional: Denies fever, chills, diaphoresis, appetite change and fatigue.  HEENT: Denies photophobia, eye pain, redness, hearing loss, ear pain, congestion, sore throat, rhinorrhea, sneezing, mouth sores, trouble swallowing, neck pain, neck stiffness and tinnitus.   Respiratory: Denies SOB, DOE, cough, chest tightness,  and wheezing.   Cardiovascular: Denies chest pain, palpitations and leg swelling.    Gastrointestinal: Denies nausea, vomiting, abdominal pain, diarrhea, constipation, blood in stool and abdominal distention.  Genitourinary: Denies dysuria, urgency, frequency, hematuria, flank pain and difficulty urinating.  Musculoskeletal: Denies myalgias, back pain, joint swelling, arthralgias and gait problem.  Skin: Denies pallor, rash and wound.  Neurological: Denies dizziness, seizures, syncope, weakness, light-headedness, numbness and headaches.  Hematological: Denies adenopathy. Easy bruising, personal or family bleeding history  Psychiatric/Behavioral: Denies suicidal ideation, mood changes, confusion, nervousness, sleep disturbance and agitation   Physical Exam: Blood pressure 169/88, pulse 65, temperature 97.8 F (36.6 C), temperature source Oral, resp. rate 18, SpO2 97.00%. Gen: AAOx3, no distress HEENT: PERRLA< EOMI CVS: S1S2/RRR, no m/r/g Lungs: CTAB Abd: soft, obese, NT, BS present Ext: no edema c/c Neuro: Cranial nerves 2-12 intact, Motor 5/5 in all ext, sensations intact, plantars-upgoing Reflexes: diminished all over Co-ordination: finger nose normal  Labs on Admission:  Results for orders placed during the hospital encounter of 07/13/11 (from the past 48 hour(s))  PROTIME-INR     Status: Abnormal   Collection Time   07/13/11 10:03 AM      Component Value Range Comment   Prothrombin Time 18.7 (*) 11.6 - 15.2 (seconds)    INR 1.53 (*) 0.00 - 1.49    APTT     Status: Normal   Collection Time   07/13/11 10:03 AM      Component Value Range Comment   aPTT 37  24 - 37 (seconds)   CBC     Status: Abnormal   Collection Time   07/13/11 10:03 AM      Component Value Range Comment   WBC 7.2  4.0 - 10.5 (K/uL)    RBC 3.91 (*) 4.22 - 5.81 (MIL/uL)    Hemoglobin 11.9 (*) 13.0 - 17.0 (g/dL)    HCT 16.1 (*) 09.6 - 52.0 (%)    MCV 91.3  78.0 - 100.0 (fL)    MCH 30.4  26.0 - 34.0 (pg)    MCHC 33.3  30.0 - 36.0 (g/dL)    RDW 04.5 (*) 40.9 - 15.5 (%)    Platelets 201  150 -  400 (K/uL)   DIFFERENTIAL     Status: Normal   Collection Time   07/13/11 10:03 AM      Component Value Range Comment   Neutrophils Relative 72  43 - 77 (%)    Neutro Abs 5.2  1.7 - 7.7 (K/uL)    Lymphocytes Relative 17  12 - 46 (%)    Lymphs Abs 1.2  0.7 - 4.0 (K/uL)    Monocytes Relative 6  3 - 12 (%)    Monocytes Absolute 0.4  0.1 - 1.0 (K/uL)    Eosinophils Relative 5  0 - 5 (%)  Eosinophils Absolute 0.4  0.0 - 0.7 (K/uL)    Basophils Relative 0  0 - 1 (%)    Basophils Absolute 0.0  0.0 - 0.1 (K/uL)   COMPREHENSIVE METABOLIC PANEL     Status: Abnormal   Collection Time   07/13/11 10:03 AM      Component Value Range Comment   Sodium 136  135 - 145 (mEq/L)    Potassium 3.7  3.5 - 5.1 (mEq/L)    Chloride 99  96 - 112 (mEq/L)    CO2 27  19 - 32 (mEq/L)    Glucose, Bld 134 (*) 70 - 99 (mg/dL)    BUN 15  6 - 23 (mg/dL)    Creatinine, Ser 6.21  0.50 - 1.35 (mg/dL)    Calcium 8.7  8.4 - 10.5 (mg/dL)    Total Protein 6.8  6.0 - 8.3 (g/dL)    Albumin 3.1 (*) 3.5 - 5.2 (g/dL)    AST 22  0 - 37 (U/L) HEMOLYSIS AT THIS LEVEL MAY AFFECT RESULT   ALT 12  0 - 53 (U/L)    Alkaline Phosphatase 73  39 - 117 (U/L)    Total Bilirubin 0.8  0.3 - 1.2 (mg/dL)    GFR calc non Af Amer 55 (*) >90 (mL/min)    GFR calc Af Amer 63 (*) >90 (mL/min)   CK TOTAL AND CKMB     Status: Normal   Collection Time   07/13/11 10:04 AM      Component Value Range Comment   Total CK 93  7 - 232 (U/L)    CK, MB 2.8  0.3 - 4.0 (ng/mL)    Relative Index RELATIVE INDEX IS INVALID  0.0 - 2.5    TROPONIN I     Status: Normal   Collection Time   07/13/11 10:04 AM      Component Value Range Comment   Troponin I <0.30  <0.30 (ng/mL)   POCT I-STAT, CHEM 8     Status: Abnormal   Collection Time   07/13/11 10:20 AM      Component Value Range Comment   Sodium 141  135 - 145 (mEq/L)    Potassium 3.6  3.5 - 5.1 (mEq/L)    Chloride 101  96 - 112 (mEq/L)    BUN 16  6 - 23 (mg/dL)    Creatinine, Ser 3.08  0.50 - 1.35 (mg/dL)      Glucose, Bld 657 (*) 70 - 99 (mg/dL)    Calcium, Ion 8.46  1.12 - 1.32 (mmol/L)    TCO2 27  0 - 100 (mmol/L)    Hemoglobin 12.2 (*) 13.0 - 17.0 (g/dL)    HCT 96.2 (*) 95.2 - 52.0 (%)   GLUCOSE, CAPILLARY     Status: Abnormal   Collection Time   07/13/11 10:36 AM      Component Value Range Comment   Glucose-Capillary 115 (*) 70 - 99 (mg/dL)    Comment 1 Documented in Chart      Comment 2 Notify RN       Radiological Exams on Admission: Ct Head Wo Contrast  07/13/2011  *RADIOLOGY REPORT*  Clinical Data: Code stroke  CT HEAD WITHOUT CONTRAST  Technique:  Contiguous axial images were obtained from the base of the skull through the vertex without contrast.  Comparison: 11/ 01/2007 and MRI 02/23/10  Findings: No skull fracture is noted.  There is mucosal thickening bilateral sphenoid sinuses.  The mastoid air cells are unremarkable.  No  intracranial hemorrhage, mass effect or midline shift.  Stable mild cerebral atrophy.  Stable small lacunar infarct in the left posterior cerebellum  No acute infarction.  No mass lesion is noted on this unenhanced scan.  IMPRESSION:  1.  No acute intracranial abnormality.  No definite acute cortical infarction. 2.  Stable cerebral atrophy. 3.  Mild mucosal thickening bilateral sphenoid sinus. 4.  Stable small lacunar infarct left posterior cerebellum.  Original Report Authenticated By: Natasha Mead, M.D.   Mr Brain Wo Contrast  07/13/2011  *RADIOLOGY REPORT*  Clinical Data: Patient awoke with speech difficulty.  Dementia. Hypertension.  Atrial fibrillation.  Hyperlipidemia.  MRI HEAD WITHOUT CONTRAST  Technique:  Multiplanar, multiecho pulse sequences of the brain and surrounding structures were obtained according to standard protocol without intravenous contrast.  Comparison: 07/13/2011  Findings: There is no acute left parietal white matter infarction which affects the subcortical and periventricular regions in a linear fashion (images 20 - 21, series 3).  There is no  associated hemorrhage or mass effect.  There is no midline shift.  No mass lesions observed.  There is no hydrocephalus or extra-axial fluid.  Moderate atrophy is present with slight chronic microvascular ischemic change.  Bilateral left greater than right chronic cerebellar infarcts are observed.  No proximal vascular occlusion. Empty sella.  Mild chronic sinus disease noted with air-fluid level in the left sphenoid division.  Negative orbits.  Compared with prior MR 2012, the infarct is acute.  Compared with most recent CT the infarct is not visible.  IMPRESSION: Acute left parietal subcortical and periventricular white matter infarction.  Moderate atrophy with slight chronic microvascular ischemic change.  No acute hemorrhage.  Original Report Authenticated By: [name], M.D.    Assessment/Plan  1. Suspected TIA vs CVA symptoms appear to have resolved now Continue Coumadin, INR subtherapeutic, will ask Pharmacy to dose Also add ASA-325mg  for now Check MRI/MRA Carotid duplex/echo Lipids, Hbaic 2. DEMENTIA: stable, continue home meds, xanax PRN 3. AF (atrial fibrillation): in NSR, rate controlled, coumadin 4. H/o CAD/CHF: stable, continue metoprolol, statin and PO lasix 5. HTN: stable Code status: DNR   Time Spent on Admission:  Madlynn Lundeen [facility] Pager: [phone] 07/13/2011, 2:57 PM

## 2011-07-13 NOTE — Progress Notes (Signed)
07/13/11 1225  Discharge Planning  Type of Residence Private residence  Home Care Services No  Support Systems Other relatives  Do you have any problems obtaining your medications? No  Family/patient expects to be discharged to: Unsure  Once you are discharged, how will you get to your follow-up appointment? Family  Expected Discharge Date 07/16/11  Case Management Consult Needed No  Social Work Consult Needed Yes (Comment)     Please consult unit based LCSW for disposition needs and/or if psychosocial needs are identified.  Dionne Milo MSW [facility] Emergency Dept. Weekend/Social Worker [phone]

## 2011-07-13 NOTE — ED Provider Notes (Signed)
History     CSN: [id number]  Arrival date & time 07/13/11  4098   First MD Initiated Contact with Patient 07/13/11 1007      Chief Complaint  Patient presents with  . Altered Mental Status  . Aphasia    (Consider location/radiation/quality/duration/timing/severity/associated sxs/prior treatment) HPI Comments: Patient presents today for some mild confusion and difficulty getting coherent sentences out.  Patient was last completely normal last night before he went to bed.  The son lives with him and noted this morning that he could not speak sentences clearly and that his sentences do not make sense.  Patient had no other focal weakness or numbness that was noted.  No facial droop.  There is some possible past history of strokes.  Patient is on Coumadin for A. fib as well.  No fevers, nausea, vomiting or headache.  Patient is a 78 y.o. male presenting with altered mental status. The history is provided by the patient and a relative. No language interpreter was used.  Altered Mental Status This is a new problem. Pertinent negatives include no chest pain, no abdominal pain, no headaches and no shortness of breath.    Past Medical History  Diagnosis Date  . Anxiety   . Hypertension   . Hyperlipidemia   . CAD (coronary artery disease)   . Dementia   . Gout, unspecified 2012    crystal analysis at urgent care  . AF (atrial fibrillation)   . 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 Cancer 02/25/11    Squamous Cell Carcinoma- Left Dorsum Hand    Past Surgical History  Procedure Date  . Cholecystectomy   . Endarterectomy 1996    Bilateral (2 weeks apart)  . Skin cancer removal 06/2007    (? basal cell)  . 4 vessel cabg 06/97  . Carotid endarterectomy 03/11/02  . Mri brain 02/23/10    No acute abnormality Chronic Cerebral Ischemia Stable Ventr Prominence due to Volume loss  . Pseudoaneurysm repair 08/26/10    Right carotid endarterectomy pseudoaneurysm    . Peg placement     Family History  Problem Relation Age of Onset  . Colon cancer Sister   . Hypertension Brother   . Cancer Brother     intestine  . Brain cancer Brother   . Melanoma Sister   . Coronary artery disease Son     1, heart attack    History  Substance Use Topics  . Smoking status: Former Smoker    Types: Cigarettes  . Smokeless tobacco: Never Used   Comment: Remote mild smoking history  . Alcohol Use: No      Review of Systems  Constitutional: Negative.  Negative for fever and chills.  HENT: Negative.   Eyes: Negative.   Respiratory: Negative.  Negative for cough and shortness of breath.   Cardiovascular: Negative.  Negative for chest pain.  Gastrointestinal: Negative.  Negative for nausea, vomiting and abdominal pain.  Genitourinary: Negative.  Negative for hematuria.  Musculoskeletal: Negative.  Negative for back pain.  Skin: Negative.  Negative for color change and rash.  Neurological: Positive for speech difficulty. Negative for syncope and headaches.  Hematological: Negative.  Negative for adenopathy.  Psychiatric/Behavioral: Positive for confusion and altered mental status.  All other systems reviewed and are negative.    Allergies  Lorazepam; Naproxen sodium; Niacin; Ramipril; Sulfonamide derivatives; and Zolpidem tartrate  Home Medications   Current Outpatient Rx  Name Route Sig Dispense Refill  .  ALLOPURINOL 100 MG PO TABS Oral Take 100 mg by mouth daily.    Marland Kitchen ALPRAZOLAM 0.25 MG PO TABS  take 1 tablet by mouth twice a day 60 tablet 3  . FUROSEMIDE 40 MG PO TABS Oral Take 0.5 tablets (20 mg total) by mouth daily. 30 tablet 5  . MEMANTINE HCL 10 MG PO TABS Oral Take 10 mg by mouth 2 (two) times daily.    Marland Kitchen METOPROLOL TARTRATE 50 MG PO TABS Oral Take 1.5 tablets (75 mg total) by mouth 2 (two) times daily.    Marland Kitchen PANTOPRAZOLE SODIUM 40 MG PO TBEC  take 1 tablet by mouth twice a day 60 tablet 5  . ROSUVASTATIN CALCIUM 20 MG PO TABS Oral Take 1  tablet (20 mg total) by mouth daily. 30 tablet 5  . SERTRALINE HCL 50 MG PO TABS Oral Take 75 mg by mouth daily.    . WARFARIN SODIUM 2 MG PO TABS Oral Take 1-2 mg by mouth See admin instructions. Take one-half pill (1mg ) on Monday and Thursday. Take 1 pill (2mg ) on Tuesday, Wednesday, Friday, Saturday, and Sunday.      BP 140/58  Pulse 76  Temp(Src) 98.3 F (36.8 C) (Oral)  Resp 20  SpO2 98%  Physical Exam  Nursing note and vitals reviewed. Constitutional: He is oriented to person, place, and time. He appears well-developed and well-nourished.  Non-toxic appearance. He does not have a sickly appearance.  HENT:  Head: Normocephalic and atraumatic.  Eyes: Conjunctivae, EOM and lids are normal. Pupils are equal, round, and reactive to light.  Neck: Trachea normal, normal range of motion and full passive range of motion without pain. Neck supple.  Cardiovascular: Normal heart sounds.   Pulmonary/Chest: Effort normal and breath sounds normal. No respiratory distress. He has no wheezes. He has no rales.  Abdominal: Soft. Normal appearance. He exhibits no distension. There is no tenderness. There is no rebound and no CVA tenderness.  Musculoskeletal: Normal range of motion.  Neurological: He is alert and oriented to person, place, and time. He has normal strength.       Face appears symmetric.  Tongue is midline.  No visual field deficits.  Normal finger nose testing bilaterally.  5 out of 5 strength in his upper extremities and lower extremities.  No pronator drift.  No dysarthria.  Patient does have some expressive aphasia.  Skin: Skin is warm, dry and intact. No rash noted.  Psychiatric: He has a normal mood and affect. His behavior is normal.    ED Course  Procedures (including critical care time)  Results for orders placed during the hospital encounter of 07/13/11  PROTIME-INR      Component Value Range   Prothrombin Time 18.7 (*) 11.6 - 15.2 (seconds)   INR 1.53 (*) 0.00 - 1.49     APTT      Component Value Range   aPTT 37  24 - 37 (seconds)  CBC      Component Value Range   WBC 7.2  4.0 - 10.5 (K/uL)   RBC 3.91 (*) 4.22 - 5.81 (MIL/uL)   Hemoglobin 11.9 (*) 13.0 - 17.0 (g/dL)   HCT 16.1 (*) 09.6 - 52.0 (%)   MCV 91.3  78.0 - 100.0 (fL)   MCH 30.4  26.0 - 34.0 (pg)   MCHC 33.3  30.0 - 36.0 (g/dL)   RDW 04.5 (*) 40.9 - 15.5 (%)   Platelets 201  150 - 400 (K/uL)  DIFFERENTIAL  Component Value Range   Neutrophils Relative 72  43 - 77 (%)   Neutro Abs 5.2  1.7 - 7.7 (K/uL)   Lymphocytes Relative 17  12 - 46 (%)   Lymphs Abs 1.2  0.7 - 4.0 (K/uL)   Monocytes Relative 6  3 - 12 (%)   Monocytes Absolute 0.4  0.1 - 1.0 (K/uL)   Eosinophils Relative 5  0 - 5 (%)   Eosinophils Absolute 0.4  0.0 - 0.7 (K/uL)   Basophils Relative 0  0 - 1 (%)   Basophils Absolute 0.0  0.0 - 0.1 (K/uL)  COMPREHENSIVE METABOLIC PANEL      Component Value Range   Sodium 136  135 - 145 (mEq/L)   Potassium 3.7  3.5 - 5.1 (mEq/L)   Chloride 99  96 - 112 (mEq/L)   CO2 27  19 - 32 (mEq/L)   Glucose, Bld 134 (*) 70 - 99 (mg/dL)   BUN 15  6 - 23 (mg/dL)   Creatinine, Ser 1.61  0.50 - 1.35 (mg/dL)   Calcium 8.7  8.4 - 09.6 (mg/dL)   Total Protein 6.8  6.0 - 8.3 (g/dL)   Albumin 3.1 (*) 3.5 - 5.2 (g/dL)   AST 22  0 - 37 (U/L)   ALT 12  0 - 53 (U/L)   Alkaline Phosphatase 73  39 - 117 (U/L)   Total Bilirubin 0.8  0.3 - 1.2 (mg/dL)   GFR calc non Af Amer 55 (*) >90 (mL/min)   GFR calc Af Amer 63 (*) >90 (mL/min)  CK TOTAL AND CKMB      Component Value Range   Total CK 93  7 - 232 (U/L)   CK, MB 2.8  0.3 - 4.0 (ng/mL)   Relative Index RELATIVE INDEX IS INVALID  0.0 - 2.5   TROPONIN I      Component Value Range   Troponin I <0.30  <0.30 (ng/mL)  POCT I-STAT, CHEM 8      Component Value Range   Sodium 141  135 - 145 (mEq/L)   Potassium 3.6  3.5 - 5.1 (mEq/L)   Chloride 101  96 - 112 (mEq/L)   BUN 16  6 - 23 (mg/dL)   Creatinine, Ser 0.45  0.50 - 1.35 (mg/dL)   Glucose, Bld  409 (*) 70 - 99 (mg/dL)   Calcium, Ion 8.11  9.14 - 1.32 (mmol/L)   TCO2 27  0 - 100 (mmol/L)   Hemoglobin 12.2 (*) 13.0 - 17.0 (g/dL)   HCT 78.2 (*) 95.6 - 52.0 (%)  GLUCOSE, CAPILLARY      Component Value Range   Glucose-Capillary 115 (*) 70 - 99 (mg/dL)   Comment 1 Documented in Chart     Comment 2 Notify RN     Ct Head Wo Contrast  07/13/2011  *RADIOLOGY REPORT*  Clinical Data: Code stroke  CT HEAD WITHOUT CONTRAST  Technique:  Contiguous axial images were obtained from the base of the skull through the vertex without contrast.  Comparison: 11/ 01/2007 and MRI 02/23/10  Findings: No skull fracture is noted.  There is mucosal thickening bilateral sphenoid sinuses.  The mastoid air cells are unremarkable.  No intracranial hemorrhage, mass effect or midline shift.  Stable mild cerebral atrophy.  Stable small lacunar infarct in the left posterior cerebellum  No acute infarction.  No mass lesion is noted on this unenhanced scan.  IMPRESSION:  1.  No acute intracranial abnormality.  No definite acute cortical infarction. 2.  Stable cerebral atrophy. 3.  Mild mucosal thickening bilateral sphenoid sinus. 4.  Stable small lacunar infarct left posterior cerebellum.  Original Report Authenticated By: Natasha Mead, M.D.       Date: 07/13/2011  Rate: 69  Rhythm: Atrial fibrillation with bigeminy with controlled rate  QRS Axis: normal  Intervals: normal  ST/T Wave abnormalities: nonspecific T wave changes  Conduction Disutrbances:none  Narrative Interpretation:   Old EKG Reviewed: changed from 09-10-10 with afib with controlled rate but no bigeminy    MDM  Patient with symptoms concerning for acute stroke.  Patient did not qualify for any TPA given that his last seen normal was last night.  Patient has been concurrently evaluated by Dr. Thad Ranger from the neuro hospitalist service.  She agrees the patient does require admission for further evaluation for likely acute stroke.  Patient is hemodynamically  stable at this time.  His blood pressure does not require treatment at this time.  His Coumadin level is subtherapeutic and does not require further adjustments at this time.  I will contact the hospitalist service for admission.        Nat Christen, MD 07/13/11 1122

## 2011-07-13 NOTE — ED Notes (Signed)
Patient with reported onset of slurred speech and confusion today at 0830,  Enroute,  The patients sx have resolved.  Code stroke cancelled at the bridge

## 2011-07-13 NOTE — ED Notes (Addendum)
Code [place] encoded 09:50, called at 09:58. Pt arrival to ED 09:58, LSN: 08:30 this morning at home, states son noticed that he was slurring speech and was more confused than normal. Dr. Ranae Palms at bedside 10:00 to perform exam, Code Stroke cancelled at this time. Pt taken to CT scan 10:02.

## 2011-07-13 NOTE — ED Notes (Signed)
Report given to Vernona Rieger, RN. Pt to be transported to medical-surgical bed. Floor nurse had no further questions. Pt and family updated on plan of care.

## 2011-07-14 ENCOUNTER — Inpatient Hospital Stay (HOSPITAL_COMMUNITY): Payer: Medicare Other

## 2011-07-14 DIAGNOSIS — R4701 Aphasia: Secondary | ICD-10-CM

## 2011-07-14 DIAGNOSIS — G459 Transient cerebral ischemic attack, unspecified: Secondary | ICD-10-CM

## 2011-07-14 DIAGNOSIS — Z7901 Long term (current) use of anticoagulants: Secondary | ICD-10-CM

## 2011-07-14 DIAGNOSIS — I6340 Cerebral infarction due to embolism of unspecified cerebral artery: Secondary | ICD-10-CM

## 2011-07-14 DIAGNOSIS — I359 Nonrheumatic aortic valve disorder, unspecified: Secondary | ICD-10-CM

## 2011-07-14 DIAGNOSIS — F0390 Unspecified dementia without behavioral disturbance: Secondary | ICD-10-CM

## 2011-07-14 DIAGNOSIS — I4891 Unspecified atrial fibrillation: Secondary | ICD-10-CM

## 2011-07-14 LAB — RAPID URINE DRUG SCREEN, HOSP PERFORMED
Amphetamines: NOT DETECTED
Barbiturates: NOT DETECTED
Tetrahydrocannabinol: NOT DETECTED

## 2011-07-14 LAB — GLUCOSE, CAPILLARY: Glucose-Capillary: 136 mg/dL — ABNORMAL HIGH (ref 70–99)

## 2011-07-14 LAB — PROTIME-INR: INR: 1.41 (ref 0.00–1.49)

## 2011-07-14 LAB — LIPID PANEL: Cholesterol: 105 mg/dL (ref 0–200)

## 2011-07-14 MED ORDER — WARFARIN SODIUM 2.5 MG PO TABS
2.5000 mg | ORAL_TABLET | Freq: Once | ORAL | Status: AC
  Administered 2011-07-14: 2.5 mg via ORAL
  Filled 2011-07-14: qty 1

## 2011-07-14 MED ORDER — ENOXAPARIN SODIUM 40 MG/0.4ML ~~LOC~~ SOLN
40.0000 mg | SUBCUTANEOUS | Status: DC
  Administered 2011-07-14 – 2011-07-15 (×2): 40 mg via SUBCUTANEOUS
  Filled 2011-07-14 (×2): qty 0.4

## 2011-07-14 MED ORDER — ENOXAPARIN SODIUM 80 MG/0.8ML ~~LOC~~ SOLN
80.0000 mg | Freq: Two times a day (BID) | SUBCUTANEOUS | Status: DC
Start: 2011-07-14 — End: 2011-07-14
  Filled 2011-07-14 (×2): qty 0.8

## 2011-07-14 NOTE — Progress Notes (Signed)
ANTICOAGULATION CONSULT NOTE - Follow Up Consult  Pharmacy Consult for lovenox + coumadin Indication: atrial fibrillation  Vital Signs: Temp: 97.4 F (36.3 C) (06/09 0600) BP: 137/58 mmHg (06/09 0600) Pulse Rate: 66  (06/09 0600)  Labs:  Basename 07/14/11 0505 07/13/11 1020 07/13/11 1004 07/13/11 1003  HGB -- 12.2* -- 11.9*  HCT -- 36.0* -- 35.7*  PLT -- -- -- 201  APTT -- -- -- 37  LABPROT 17.5* -- -- 18.7*  INR 1.41 -- -- 1.53*  HEPARINUNFRC -- -- -- --  CREATININE -- 1.20 -- 1.18  CKTOTAL -- -- 93 --  CKMB -- -- 2.8 --  TROPONINI -- -- <0.30 --    The CrCl is unknown because both a height and weight (above a minimum accepted value) are required for this calculation.  Assessment: 9 yom admitted with CVA on chronic coumadin for aifb. INR remains subtherapeutic at 1.41. No bleeding noted. Starting prophylactic lovenox today while INR is subtherapeutic.   Goal of Therapy:  INR 2-3 Monitor platelets by anticoagulation protocol: Yes   Plan:  1. Coumadin 2.5mg  PO x 1 tonight 2. Lovenox 40mg  SQ Q24H - DC when INR 2-3 3. F/u AM INR  Kherington Meraz, Drake Leach 07/14/2011,9:58 AM

## 2011-07-14 NOTE — Progress Notes (Signed)
  Echocardiogram 2D Echocardiogram has been performed.  Addison, [place]idbee 07/14/2011, 10:08 AM

## 2011-07-14 NOTE — Progress Notes (Signed)
History: [name] is an 78 y.o. male who went to bed (07/12/11) night and was felt to be normal at that time. Am 07/13/11, upon awakening, was having difficulty getting his thoughts out. Words were not understandable at times. Also seemed to have difficulty with his usual ADL''s such as figuring out how to get his pants unbuckled to use the bathroom. Symptoms did not clear and patient was brought to the ED for further evaluation. Code stroke was called. But cancelled as patient presented outside intervention window. Patient is on Coumadin at home due to atrial fibrillation. Lives with son due to dementia  LSN: 07/12/11  tPA Given: No: Outside time window  Subjective: I'm doing ok.  Had a good night.  Son at bedside.  Speech is normal, back to baseline.  Objective: BP 137/58  Pulse 66  Temp(Src) 97.4 F (36.3 C) (Oral)  Resp 18  SpO2 95% Telemetry:AFib  CBGs  Basename 07/14/11 0805 07/13/11 2221 07/13/11 1639 07/13/11 1036  GLUCAP 100* 128* 109* 115*    Diet: Heart Healthy, thin  Activity: OOB  DVT Prophylaxis: none   Medications: Scheduled:   . allopurinol  100 mg Oral Daily  . aspirin  325 mg Oral Daily  . atorvastatin  20 mg Oral q1800  . furosemide  20 mg Oral Daily  . memantine  10 mg Oral BID  . metoprolol  75 mg Oral BID  . pantoprazole  40 mg Oral BID AC  . sertraline  75 mg Oral Daily  . warfarin  2.5 mg Oral ONCE-1800  . Warfarin - Pharmacist Dosing Inpatient   Does not apply q1800   Neurologic Exam: Mental Status: Alert, oriented to person and place.  Year 2004.  (has mild-mod dementia at baseline), thought content appropriate.  Speech fluent without evidence of aphasia. Able to follow 3 step commands without difficulty. Cranial Nerves: II- Visual fields grossly intact. III/IV/VI-Extraocular movements intact.   V/VII-Smile symmetric VIII-hearing grossly intact IX/X-normal gag XI-bilateral shoulder shrug XII-midline tongue extension Motor: 5/5  bilaterally with increased tone bilateral LE's Sensory: Light touch intact throughout, bilaterally Deep Tendon Reflexes: 2+ UEs, diminished patellars, 0 Ankles.    Plantars: upgoing bilaterally Cerebellar: Normal finger-to-nose and normal heel-to-shin test.   Lab Results: Basic Metabolic Panel:  Lab 07/13/11 1610 07/13/11 1003  NA 141 136  K 3.6 3.7  CL 101 99  CO2 -- 27  GLUCOSE 135* 134*  BUN 16 15  CREATININE 1.20 1.18  CALCIUM -- 8.7  MG -- --  PHOS -- --   Liver Function Tests:  Lab 07/13/11 1003  AST 22  ALT 12  ALKPHOS 73  BILITOT 0.8  PROT 6.8  ALBUMIN 3.1*   CBC:  Lab 07/13/11 1020 07/13/11 1003  WBC -- 7.2  NEUTROABS -- 5.2  HGB 12.2* 11.9*  HCT 36.0* 35.7*  MCV -- 91.3  PLT -- 201   Cardiac Enzymes:  Lab 07/13/11 1004  CKTOTAL 93  CKMB 2.8  CKMBINDEX --  TROPONINI <0.30   CBG:  Lab 07/14/11 0805 07/13/11 2221 07/13/11 1639 07/13/11 1036  GLUCAP 100* 128* 109* 115*   Hemoglobin A1C:  Lab 07/13/11 1555  HGBA1C 6.3*   Fasting Lipid Panel:  Lab 07/14/11 0505  CHOL 105  HDL 25*  LDLCALC 48  TRIG 960*  CHOLHDL 4.2  LDLDIRECT --   Coagulation:  Lab 07/14/11 0505 07/13/11 1003  LABPROT 17.5* 18.7*  INR 1.41 1.53*   Misc. Labs  Study Results:  07/13/2011  CT  HEAD WITHOUT CONTRAST   Findings: No skull fracture is noted.  There is mucosal thickening bilateral sphenoid sinuses.  The mastoid air cells are unremarkable.  No intracranial hemorrhage, mass effect or midline shift.  Stable mild cerebral atrophy.  Stable small lacunar infarct in the left posterior cerebellum  No acute infarction.  No mass lesion is noted on this unenhanced scan.   IMPRESSION:  1.  No acute intracranial abnormality.  No definite acute cortical infarction. 2.  Stable cerebral atrophy. 3.  Mild mucosal thickening bilateral sphenoid sinus. 4.  Stable small lacunar infarct left posterior cerebellum. [name], M.D.   07/13/2011  MRI HEAD WITHOUT CONTRAST  Findings:  There is no acute left parietal white matter infarction which affects the subcortical and periventricular regions in a linear fashion (images 20 - 21, series 3).  There is no associated hemorrhage or mass effect.  There is no midline shift.  No mass lesions observed.  There is no hydrocephalus or extra-axial fluid.  Moderate atrophy is present with slight chronic microvascular ischemic change.  Bilateral left greater than right chronic cerebellar infarcts are observed.  No proximal vascular occlusion. Empty sella.  Mild chronic sinus disease noted with air-fluid level in the left sphenoid division.  Negative orbits.  Compared with prior MR 2012, the infarct is acute.  Compared with most recent CT the infarct is not visible.   IMPRESSION: Acute left parietal subcortical and periventricular white matter infarction.  Moderate atrophy with slight chronic microvascular ischemic change.  No acute hemorrhage.[name], M.D.   Therapies: pending  Assessment/Plan: 78 y.o. male with history of atrial fibrillation, subtherapeutic on Coumadin, who presents with an expressive aphasia.  MRI 07/13/11 confirmed acute left parietal subcortical and periventricular white matter infarction.  This morning, son states patient has returned to baseline.    Stroke Risk Factors - atrial fibrillation, hyperlipidemia and hypertension   Plan:  1. Start lovenox for VTE prophylaxis until INR therapeutic 2. MRA of the brain without contrast 3. Speech consult, OT consult, PT 4. Echocardiogram  5. Carotid dopplers  6. Prophylactic therapy-Anticoagulation: Coumadin- dose to be determined by pharmacy. May bridge with ASA. INR target between 2 and 3.  7. Risk factor modification  8. Telemetry monitoring     LOS: 1 day   Sean Fossa PA-C [facility] [phone] 07/14/2011  9:31 AM  [name]

## 2011-07-14 NOTE — Progress Notes (Signed)
*  PRELIMINARY RESULTS* Vascular Ultrasound Carotid Duplex (Doppler) has been completed.   No evidence of internal carotid artery stenosis bilaterally. Bilateral antegrade vertebral artery flow.  Malachy Moan, RDMS, RDCS 07/14/2011, 4:04 PM

## 2011-07-14 NOTE — Progress Notes (Signed)
Subjective: Feels completely fine, speech problems resolved per son  Objective: Vital signs in last 24 hours: Temp:  [97.3 F (36.3 C)-98.8 F (37.1 C)] 97.4 F (36.3 C) (06/09 0600) Pulse Rate:  [57-70] 66  (06/09 0600) Resp:  [18] 18  (06/09 0600) BP: (103-169)/(48-88) 137/58 mmHg (06/09 0600) SpO2:  [90 %-98 %] 95 % (06/09 0600) Weight change:  Last BM Date: 07/13/11  Intake/Output from previous day: 06/08 0701 - 06/09 0700 In: -  Out: 325 [Urine:325]     Physical Exam: General: Alert, awake, oriented x3, in no acute distress. HEENT: No bruits, no goiter. Heart: Regular rate and rhythm, without murmurs, rubs, gallops. Lungs: Clear to auscultation bilaterally. Abdomen: Soft, nontender, nondistended, positive bowel sounds. Extremities: No clubbing cyanosis or edema with positive pedal pulses. Neuro: Grossly intact, nonfocal, occasional garbled speech but improved    Lab Results: Basic Metabolic Panel:  Basename 07/13/11 1020 07/13/11 1003  NA 141 136  K 3.6 3.7  CL 101 99  CO2 -- 27  GLUCOSE 135* 134*  BUN 16 15  CREATININE 1.20 1.18  CALCIUM -- 8.7  MG -- --  PHOS -- --   Liver Function Tests:  Basename 07/13/11 1003  AST 22  ALT 12  ALKPHOS 73  BILITOT 0.8  PROT 6.8  ALBUMIN 3.1*   No results found for this basename: LIPASE:2,AMYLASE:2 in the last 72 hours No results found for this basename: AMMONIA:2 in the last 72 hours CBC:  Basename 07/13/11 1020 07/13/11 1003  WBC -- 7.2  NEUTROABS -- 5.2  HGB 12.2* 11.9*  HCT 36.0* 35.7*  MCV -- 91.3  PLT -- 201   Cardiac Enzymes:  Basename 07/13/11 1004  CKTOTAL 93  CKMB 2.8  CKMBINDEX --  TROPONINI <0.30   BNP: No results found for this basename: PROBNP:3 in the last 72 hours D-Dimer: No results found for this basename: DDIMER:2 in the last 72 hours CBG:  Basename 07/14/11 0805 07/13/11 2221 07/13/11 1639 07/13/11 1036  GLUCAP 100* 128* 109* 115*   Hemoglobin A1C:  Basename 07/13/11  1555  HGBA1C 6.3*   Fasting Lipid Panel:  Basename 07/14/11 0505  CHOL 105  HDL 25*  LDLCALC 48  TRIG 161*  CHOLHDL 4.2  LDLDIRECT --   Thyroid Function Tests: No results found for this basename: TSH,T4TOTAL,FREET4,T3FREE,THYROIDAB in the last 72 hours Anemia Panel: No results found for this basename: VITAMINB12,FOLATE,FERRITIN,TIBC,IRON,RETICCTPCT in the last 72 hours Coagulation:  Basename 07/14/11 0505 07/13/11 1003  LABPROT 17.5* 18.7*  INR 1.41 1.53*   Urine Drug Screen: Drugs of Abuse  No results found for this basename: labopia, cocainscrnur, labbenz, amphetmu, thcu, labbarb    Alcohol Level: No results found for this basename: ETH:2 in the last 72 hours Urinalysis: No results found for this basename: COLORURINE:2,APPERANCEUR:2,LABSPEC:2,PHURINE:2,GLUCOSEU:2,HGBUR:2,BILIRUBINUR:2,KETONESUR:2,PROTEINUR:2,UROBILINOGEN:2,NITRITE:2,LEUKOCYTESUR:2 in the last 72 hours  No results found for this or any previous visit (from the past 240 hour(s)).  Studies/Results: Ct Head Wo Contrast  07/13/2011  *RADIOLOGY REPORT*  Clinical Data: Code stroke  CT HEAD WITHOUT CONTRAST  Technique:  Contiguous axial images were obtained from the base of the skull through the vertex without contrast.  Comparison: 11/ 01/2007 and MRI 02/23/10  Findings: No skull fracture is noted.  There is mucosal thickening bilateral sphenoid sinuses.  The mastoid air cells are unremarkable.  No intracranial hemorrhage, mass effect or midline shift.  Stable mild cerebral atrophy.  Stable small lacunar infarct in the left posterior cerebellum  No acute infarction.  No mass lesion  is noted on this unenhanced scan.  IMPRESSION:  1.  No acute intracranial abnormality.  No definite acute cortical infarction. 2.  Stable cerebral atrophy. 3.  Mild mucosal thickening bilateral sphenoid sinus. 4.  Stable small lacunar infarct left posterior cerebellum.  Original Report Authenticated By: Natasha Mead, M.D.   Mr Brain Wo  Contrast  07/13/2011  *RADIOLOGY REPORT*  Clinical Data: Patient awoke with speech difficulty.  Dementia. Hypertension.  Atrial fibrillation.  Hyperlipidemia.  MRI HEAD WITHOUT CONTRAST  Technique:  Multiplanar, multiecho pulse sequences of the brain and surrounding structures were obtained according to standard protocol without intravenous contrast.  Comparison: 07/13/2011  Findings: There is no acute left parietal white matter infarction which affects the subcortical and periventricular regions in a linear fashion (images 20 - 21, series 3).  There is no associated hemorrhage or mass effect.  There is no midline shift.  No mass lesions observed.  There is no hydrocephalus or extra-axial fluid.  Moderate atrophy is present with slight chronic microvascular ischemic change.  Bilateral left greater than right chronic cerebellar infarcts are observed.  No proximal vascular occlusion. Empty sella.  Mild chronic sinus disease noted with air-fluid level in the left sphenoid division.  Negative orbits.  Compared with prior MR 2012, the infarct is acute.  Compared with most recent CT the infarct is not visible.  IMPRESSION: Acute left parietal subcortical and periventricular white matter infarction.  Moderate atrophy with slight chronic microvascular ischemic change.  No acute hemorrhage.  Original Report Authenticated By: [name], M.D.    Medications: Scheduled Meds:   . allopurinol  100 mg Oral Daily  . aspirin  325 mg Oral Daily  . atorvastatin  20 mg Oral q1800  . enoxaparin (LOVENOX) injection  40 mg Subcutaneous Q24H  . furosemide  20 mg Oral Daily  . memantine  10 mg Oral BID  . metoprolol  75 mg Oral BID  . pantoprazole  40 mg Oral BID AC  . sertraline  75 mg Oral Daily  . warfarin  2.5 mg Oral ONCE-1800  . warfarin  2.5 mg Oral ONCE-1800  . Warfarin - Pharmacist Dosing Inpatient   Does not apply q1800  . DISCONTD: enoxaparin (LOVENOX) injection  80 mg Subcutaneous Q12H   Continuous  Infusions:  PRN Meds:.ALPRAZolam  Assessment/Plan: 1. CVA: MRI with Acute left parietal subcortical and periventricular white matter infarction. Symptoms appear to have resolved now  Continue Coumadin, INR subtherapeutic, Pharmacy to dose  Also continue  ASA-325mg  daily while INR sub therapeutic Will not pursue MRA, given Afib/dementia since it wouldn't change management, unless Neurology feels strongly about this Carotid duplex/echo pending Lipids, Hbaic are ok 2. DEMENTIA: stable, continue home meds, xanax PRN  3. AF (atrial fibrillation): in NSR, rate controlled, coumadin  4. H/o CAD/CHF: stable, continue metoprolol, statin and PO lasix  5. HTN: stable  Code status: DNR Dispo: home tomorrow pending studies    LOS: 1 day   Finn Amos [facility]  07/14/2011, 11:16 AM

## 2011-07-15 ENCOUNTER — Telehealth: Payer: Self-pay | Admitting: Family Medicine

## 2011-07-15 DIAGNOSIS — I4891 Unspecified atrial fibrillation: Secondary | ICD-10-CM

## 2011-07-15 DIAGNOSIS — G459 Transient cerebral ischemic attack, unspecified: Secondary | ICD-10-CM

## 2011-07-15 DIAGNOSIS — R4701 Aphasia: Secondary | ICD-10-CM

## 2011-07-15 DIAGNOSIS — I6340 Cerebral infarction due to embolism of unspecified cerebral artery: Secondary | ICD-10-CM

## 2011-07-15 DIAGNOSIS — Z7901 Long term (current) use of anticoagulants: Secondary | ICD-10-CM

## 2011-07-15 DIAGNOSIS — F0390 Unspecified dementia without behavioral disturbance: Secondary | ICD-10-CM

## 2011-07-15 DIAGNOSIS — I639 Cerebral infarction, unspecified: Secondary | ICD-10-CM | POA: Diagnosis present

## 2011-07-15 LAB — CBC
HCT: 36.1 % — ABNORMAL LOW (ref 39.0–52.0)
Hemoglobin: 11.9 g/dL — ABNORMAL LOW (ref 13.0–17.0)
WBC: 6.7 10*3/uL (ref 4.0–10.5)

## 2011-07-15 LAB — PROTIME-INR
INR: 1.64 — ABNORMAL HIGH (ref 0.00–1.49)
Prothrombin Time: 19.7 seconds — ABNORMAL HIGH (ref 11.6–15.2)

## 2011-07-15 MED ORDER — DABIGATRAN ETEXILATE MESYLATE 75 MG PO CAPS: 75.0000 mg | ORAL_CAPSULE | Freq: Two times a day (BID) | ORAL | Status: DC

## 2011-07-15 MED ORDER — RIVAROXABAN 15 MG PO TABS: 15.0000 mg | ORAL_TABLET | Freq: Every day | ORAL | Status: DC

## 2011-07-15 MED ORDER — RIVAROXABAN 15 MG PO TABS
15.0000 mg | ORAL_TABLET | Freq: Every day | ORAL | Status: DC
Start: 2011-07-15 — End: 2011-07-15
  Filled 2011-07-15: qty 1

## 2011-07-15 NOTE — Progress Notes (Signed)
History: Upon awakening on 07/13/11, he was having difficulty getting his thoughts out. Words were not understandable at times. Also seemed to have difficulty with figuring out how to get his pants unbuckled to use the bathroom.. Code stroke was called. Patient was not tPA candidate as LSN was evening prior to admission. Patient is on Coumadin at home due to atrial fibrillation. Lives with son due to dementia.  LSN: 07/12/11  tPA Given: No: Outside time window  Subjective: Patient does not know why he is here. He does not know what happened he tells [place]. He lives at home where his son gives him his medications. They state that he does not miss medications, even though his INR is not therapeutic. He is smiling while sitting in the bedside chair, singing that he wants to go home. Has seen [facility] in past for dementia.  Objective: BP 154/81  Pulse 60  Temp(Src) 97.4 F (36.3 C) (Oral)  Resp 20  Ht 5\' 3"  (1.6 m)  Wt 80.74 kg (178 lb)  BMI 31.53 kg/m2  SpO2 95% Telemetry:AFib  CBGs  Basename 07/14/11 2157 07/14/11 1631 07/14/11 1213 07/14/11 0805 07/13/11 2221 07/13/11 1639 07/13/11 1036  GLUCAP 136* 98 97 100* 128* 109* 115*    Diet: Heart Healthy, thin  Activity: OOB  DVT Prophylaxis: lovenox/325 ASA   Medications: Scheduled:    . allopurinol  100 mg Oral Daily  . aspirin  325 mg Oral Daily  . atorvastatin  20 mg Oral q1800  . enoxaparin (LOVENOX) injection  40 mg Subcutaneous Q24H  . furosemide  20 mg Oral Daily  . memantine  10 mg Oral BID  . metoprolol  75 mg Oral BID  . pantoprazole  40 mg Oral BID AC  . sertraline  75 mg Oral Daily  . warfarin  2.5 mg Oral ONCE-1800  . Warfarin - Pharmacist Dosing Inpatient   Does not apply q1800  . DISCONTD: enoxaparin (LOVENOX) injection  80 mg Subcutaneous Q12H   Neurologic Exam: Mental Status: Alert, oriented to person and place. (has mild-mod dementia at baseline), thought content appropriate.  Speech fluent without evidence of  aphasia. Able to follow 3 step commands without difficulty. Cranial Nerves: II- Visual fields grossly intact. III/IV/VI-Extraocular movements intact.   V/VII-Smile symmetric VIII-hearing grossly intact IX/X-normal gag XI-bilateral shoulder shrug XII-midline tongue extension Motor: 5/5 bilaterally with increased tone bilateral LE's Sensory: Light touch intact throughout, bilaterally Deep Tendon Reflexes: 2+ UEs, diminished patellars, 0 Ankles.    Plantars: upgoing bilaterally Cerebellar: Normal finger-to-nose and normal heel-to-shin test.   Lab Results: Basic Metabolic Panel:  Lab 07/13/11 7829 07/13/11 1003  NA 141 136  K 3.6 3.7  CL 101 99  CO2 -- 27  GLUCOSE 135* 134*  BUN 16 15  CREATININE 1.20 1.18  CALCIUM -- 8.7  MG -- --  PHOS -- --   Liver Function Tests:  Lab 07/13/11 1003  AST 22  ALT 12  ALKPHOS 73  BILITOT 0.8  PROT 6.8  ALBUMIN 3.1*   CBC:  Lab 07/15/11 0545 07/13/11 1020 07/13/11 1003  WBC 6.7 -- 7.2  NEUTROABS -- -- 5.2  HGB 11.9* 12.2* --  HCT 36.1* 36.0* --  MCV 90.7 -- 91.3  PLT 218 -- 201   Cardiac Enzymes:  Lab 07/13/11 1004  CKTOTAL 93  CKMB 2.8  CKMBINDEX --  TROPONINI <0.30   CBG:  Lab 07/14/11 2157 07/14/11 1631 07/14/11 1213 07/14/11 0805 07/13/11 2221 07/13/11 1639  GLUCAP 136* 98 97 100*  128* 109*   Hemoglobin A1C:  Lab 07/13/11 1555  HGBA1C 6.3*   Fasting Lipid Panel:  Lab 07/14/11 0505  CHOL 105  HDL 25*  LDLCALC 48  TRIG 161*  CHOLHDL 4.2  LDLDIRECT --   Coagulation:  Lab 07/15/11 0545 07/14/11 0505 07/13/11 1003  LABPROT 19.7* 17.5* 18.7*  INR 1.64* 1.41 1.53*   Misc. Labs  Study Results:  07/13/2011  CT HEAD WITHOUT CONTRAST  IMPRESSION:  1.  No acute intracranial abnormality.  No definite acute cortical infarction. 2.  Stable cerebral atrophy. 3.  Mild mucosal thickening bilateral sphenoid sinus. 4.  Stable small lacunar infarct left posterior cerebellum. LIVIU POP, M.D.   07/13/2011  MRI HEAD  WITHOUT CONTRAST IMPRESSION: Acute left parietal subcortical and periventricular white matter infarction.  Moderate atrophy with slight chronic microvascular ischemic change.  No acute hemorrhage.[name], M.D.   Therapies: pending    Carotid dopplers: no evidence of ICA stenosis bilaterally  Assessment/Plan: 78 y.o. male with history of atrial fibrillation, subtherapeutic on Coumadin, presenting on 07/13/11 with confirmed acute left parietal subcortical and periventricular white matter infarction.  This morning, son states patient has returned to baseline.    - atrial fibrillation - hyperlipidemia  -hypertension  -elevated glucose  Plan:  -acute left parietal subcortical and periventricular white matter infarction. -atrial fibrillation -hyperlipidemia, on statin, lipitor 20mg /day -Elevated glucose, aggressive manangement per primary team. -discussed with son and patient    Rec: subtherapeutic INR, change to xarelto, in male over 80yo,  due to nonvaluvular a fib. Patient has insurance supplement and can afford.  Patients family has met with social work assessment team and the son declined SNF in favor of the possibility that the patient can return to home.  Await therapy evaluation  Discussed with son and patient  Guy Franco, [facility],  MBA, [facility]  [phone]  I have personally examined this patient, discuss with family and the above plan of care and degree with above Dr. Pearlean Brownie    LOS: 2 days   07/15/2011  9:27 AM

## 2011-07-15 NOTE — Progress Notes (Signed)
Pt d/c to home by car with family. Assessment stable, understands to pick up prescription at the pharmacy the doctor faxed this to. MD notified RN to write in pt to follow up with Dr. Pearlean Brownie in one month. This was done, son understands to call Dr. Marlis Edelson office to set this appointment up.

## 2011-07-15 NOTE — Progress Notes (Signed)
VASCULAR LAB  TCD completed.     Terance Hart, RVT 07/15/2011, 2:15 PM

## 2011-07-15 NOTE — Progress Notes (Signed)
Pt experiencing ventricular bigeminy. Pt asymptomatic, vitals stable. Pt now in a-fib. MD made aware, no new orders given.

## 2011-07-15 NOTE — Discharge Instructions (Signed)
STROKE/TIA DISCHARGE INSTRUCTIONS SMOKING Cigarette smoking nearly doubles your risk of having a stroke & is the single most alterable risk factor  If you smoke or have smoked in the last 12 months, you are advised to quit smoking for your health.  Most of the excess cardiovascular risk related to smoking disappears within a year of stopping.  Ask you doctor about anti-smoking medications  [place] Quit Line: 1-800-QUIT NOW  Free Smoking Cessation Classes [phone]  CHOLESTEROL Know your levels; limit fat & cholesterol in your diet  Lipid Panel     Component Value Date/Time   CHOL 105 07/14/2011 0505   TRIG 158* 07/14/2011 0505   HDL 25* 07/14/2011 0505   CHOLHDL 4.2 07/14/2011 0505   VLDL 32 07/14/2011 0505   LDLCALC 48 07/14/2011 0505      Many patients benefit from treatment even if their cholesterol is at goal.  Goal: Total Cholesterol (CHOL) less than 160  Goal:  Triglycerides (TRIG) less than 150  Goal:  HDL greater than 40  Goal:  LDL (LDLCALC) less than 100   BLOOD PRESSURE American Stroke Association blood pressure target is less that 120/80 mm/Hg  Your discharge blood pressure is:  BP: 154/81 mmHg  Monitor your blood pressure  Limit your salt and alcohol intake  Many individuals will require more than one medication for high blood pressure  DIABETES (A1c is a blood sugar average for last 3 months) Goal HGBA1c is under 7% (HBGA1c is blood sugar average for last 3 months)  Diabetes: {STROKE DC DIABETES:22357}    Lab Results  Component Value Date   HGBA1C 6.3* 07/13/2011     Your HGBA1c can be lowered with medications, healthy diet, and exercise.  Check your blood sugar as directed by your physician  Call your physician if you experience unexplained or low blood sugars.  PHYSICAL ACTIVITY/REHABILITATION Goal is 30 minutes at least 4 days per week    {STROKE DC ACTIVITY/REHAB:22359}  Activity decreases your risk of heart attack and stroke and makes your heart stronger.   It helps control your weight and blood pressure; helps you relax and can improve your mood.  Participate in a regular exercise program.  Talk with your doctor about the best form of exercise for you (dancing, walking, swimming, cycling).  DIET/WEIGHT Goal is to maintain a healthy weight  Your discharge diet is: Cardiac *** liquids Your height is:  Height: 5\' 3"  (160 cm) Your current weight is: Weight: 80.74 kg (178 lb) Your Body Mass Index (BMI) is:  BMI (Calculated): 31.6   Following the type of diet specifically designed for you will help prevent another stroke.  Your goal weight range is:  ***  Your goal Body Mass Index (BMI) is 19-24.  Healthy food habits can help reduce 3 risk factors for stroke:  High cholesterol, hypertension, and excess weight.  RESOURCES Stroke/Support Group:  Call [phone]  they meet the 3rd Sunday of the month on the Rehab Unit at [facility], [place] ( no meetings June, July & Aug).  STROKE EDUCATION PROVIDED/REVIEWED AND GIVEN TO PATIENT Stroke warning signs and symptoms How to activate emergency medical system (call 911). Medications prescribed at discharge. Need for follow-up after discharge. Personal risk factors for stroke. Pneumonia vaccine given:   {STROKE DC YES/NO/DATE:22363} Flu vaccine given:   {STROKE DC YES/NO/DATE:22363} My questions have been answered, the writing is legible, and I understand these instructions.  I will adhere to these goals & educational materials that have been provided  to me after my discharge from the hospital.

## 2011-07-15 NOTE — Telephone Encounter (Signed)
Triage Record Num: [id number] Operator: Chevis Pretty Patient Name: [name] Call Date & Time: 07/13/2011 9:22:28AM Patient Phone: [phone] PCP: Crawford Givens Patient Gender: Male PCP Fax : Patient DOB: 06-Oct-1924 Practice Name: [facility] Reason for Call: Caller: Charleen Kirks; PCP: Crawford Givens Clelia Croft); CB#: [phone]; Calling emergently regarding Having trouble getting his words to come out, he can understand what anyone says to him but getting frustrated that he cant talk clear; onset 07/13/11 0830. Per protocol, advised 911 now; caller agrees. Protocol(s) Used: Confusion, Disorientation, Agitation Recommended Outcome per Protocol: Activate EMS 911 Reason for Outcome: New or worsening neuro deficits AND new or worsening confusion, disorientation or agitation Care Advice: ~ 07/13/2011 9:25:40AM Page 1 of 1 [id number]

## 2011-07-15 NOTE — Clinical Social Work Psychosocial (Signed)
     Clinical Social Work Department BRIEF PSYCHOSOCIAL ASSESSMENT 07/15/2011  Patient:  [name], [name]     Account Number:  [id number]     Admit date:  07/13/2011  Clinical Social Worker:  Peggyann Shoals  Date/Time:  07/15/2011 12:24 PM  Referred by:  Physician  Date Referred:  07/15/2011 Referred for  Other - See comment   Other Referral:   Other psychosocial needs   Interview type:  Family Other interview type:    PSYCHOSOCIAL DATA Living Status:  WITH ADULT CHILDREN Admitted from facility:   Level of care:   Primary support name:  Samik Balkcom Primary support relationship to patient:  CHILD, ADULT Degree of support available:   Very supportive. [phone]    CURRENT CONCERNS Current Concerns  None Noted   Other Concerns:    SOCIAL WORK ASSESSMENT / PLAN CSW met with pt and pt's son to address consult. CSW introduced herself and explained role of social work. Pt lives with adult son and has supportive daughters who live near by. Pt's son declined SNF in favor of pt returning home. Pt's son shared that they have have used [facility] in the past and was pleased with pt's care. Currently, PT/OT have not been consulted. Pt's son reported that pt is at his back to his base line. RNCM is aware. CSW is signing off as no further needs identified. Please reconsult if a need arises prior to discharge.   Assessment/plan status:  No Further Intervention Required Other assessment/ plan:   Information/referral to community resources:   None requested.    PATIENTS/FAMILYS RESPONSE TO PLAN OF CARE: Pt was very pleasant and alert. Pt is oriented to person, and place at times. Pt's son is agreeable to discharge plan as well as pt.

## 2011-07-16 ENCOUNTER — Telehealth: Payer: Self-pay

## 2011-07-16 NOTE — Telephone Encounter (Signed)
Faxed and given to Rena to await response. 

## 2011-07-16 NOTE — Telephone Encounter (Signed)
Received PA form requesting additional information. Form on Dr Lianne Bushy desk in the in box.

## 2011-07-16 NOTE — Telephone Encounter (Signed)
Done please scan and send.

## 2011-07-16 NOTE — Telephone Encounter (Signed)
Signed. Sent for scanning.

## 2011-07-16 NOTE — Care Management Note (Signed)
    Page 1 of 1   07/16/2011     8:14:50 AM   CARE MANAGEMENT NOTE 07/16/2011  Patient:  [name], [name]   Account Number:  [id number]  Date Initiated:  07/15/2011  Documentation initiated by:  [facility]  Subjective/Objective Assessment:   Admitted with CVA. Lives alone but son is staying with him. Independent with ambulation prior to admission.     Action/Plan:   Anticipated DC Date:  07/16/2011   Anticipated DC Plan:  HOME/SELF CARE      DC Planning Services  CM consult      Choice offered to / List presented to:             Status of service:  Completed, signed off Medicare Important Message given?   (If response is "NO", the following Medicare IM given date fields will be blank) Date Medicare IM given:   Date Additional Medicare IM given:    Discharge Disposition:  HOME/SELF CARE  Per UR Regulation:  Reviewed for med. necessity/level of care/duration of stay  If discussed at Long Length of Stay Meetings, dates discussed:    Comments:  07/15/11 Spoke with patient and son.Patient has been living alone but patient's son has moved in with him. Ambulating independently prior to admission. Has had [facility] in the past. Son stated that patient appears to be at baseline.No d/c needs identified. Will continue to follow. Jacquelynn Cree RN, BSN, CCM

## 2011-07-16 NOTE — Telephone Encounter (Signed)
pts son request PA for Xarelto. Pt discharged from [facility] 07/15/11. [name] [employer] faxed PA request for Xarelto. Sheena began PA process and sent to clinical pharmacist; ref # [id number]; will get PA answer within 24 hours by fax (this is urgent request; pt to start on med today.). Prior to hospitalization pt was taking Coumadin 2 mg daily except taking 1 mg on Mon and Thur. Pt son request instruction before end of day.

## 2011-07-16 NOTE — Telephone Encounter (Signed)
Approval letter for Xarelto received and faxed to [facility] [address] [place]. [name] pts son notified by phone. Approval letter on Dr Armanda Heritage desk for signature and then send for scanning.

## 2011-07-22 ENCOUNTER — Other Ambulatory Visit: Payer: Self-pay | Admitting: Family Medicine

## 2011-07-23 ENCOUNTER — Encounter: Payer: Self-pay | Admitting: Family Medicine

## 2011-07-23 ENCOUNTER — Ambulatory Visit (INDEPENDENT_AMBULATORY_CARE_PROVIDER_SITE_OTHER): Payer: Medicare Other | Admitting: Family Medicine

## 2011-07-23 VITALS — BP 108/72 | HR 65 | Temp 98.4°F | Wt 178.0 lb

## 2011-07-23 DIAGNOSIS — I6350 Cerebral infarction due to unspecified occlusion or stenosis of unspecified cerebral artery: Secondary | ICD-10-CM

## 2011-07-23 DIAGNOSIS — I4891 Unspecified atrial fibrillation: Secondary | ICD-10-CM

## 2011-07-23 DIAGNOSIS — E119 Type 2 diabetes mellitus without complications: Secondary | ICD-10-CM

## 2011-07-23 DIAGNOSIS — I10 Essential (primary) hypertension: Secondary | ICD-10-CM

## 2011-07-23 DIAGNOSIS — I639 Cerebral infarction, unspecified: Secondary | ICD-10-CM

## 2011-07-23 DIAGNOSIS — R633 Feeding difficulties: Secondary | ICD-10-CM

## 2011-07-23 DIAGNOSIS — F411 Generalized anxiety disorder: Secondary | ICD-10-CM

## 2011-07-23 MED ORDER — FUROSEMIDE 40 MG PO TABS: 40.0000 mg | ORAL_TABLET | Freq: Every day | ORAL | Status: DC

## 2011-07-23 MED ORDER — RIVAROXABAN 15 MG PO TABS: 15.0000 mg | ORAL_TABLET | Freq: Every day | ORAL | Status: DC

## 2011-07-23 NOTE — Telephone Encounter (Signed)
Sent!

## 2011-07-23 NOTE — Assessment & Plan Note (Signed)
Rate controlled, continue as is.  

## 2011-07-23 NOTE — Telephone Encounter (Signed)
Received refill request electronically from pharmacy. Last office visit 06/10/11. Is it okay to refill medication?

## 2011-07-23 NOTE — Assessment & Plan Note (Signed)
Controlled, continue current meds.   

## 2011-07-23 NOTE — Assessment & Plan Note (Signed)
Continue xarelto, BB, statin.  A1c controlled, BP okay.  Continue secondary prevention.  D/w pt and daughter.  Has neuro f/u in 1 month.

## 2011-07-23 NOTE — Assessment & Plan Note (Signed)
Controlled, continue 40mg  of lasix.

## 2011-07-23 NOTE — Assessment & Plan Note (Signed)
Diet controlled DM2 by A1c, will follow.  No new meds.

## 2011-07-23 NOTE — Progress Notes (Signed)
78 y.o. male with history of atrial fibrillation, subtherapeutic on coumadin, presented on 07/13/11 with confirmed acute left parietal subcortical and periventricular white matter infarction. Had returned to baseline, Carotid dopplers with no evidence of ICA stenosis bilaterally.  Changed to xarelto and needs refills.  No new focal neuro changes.  H/o dementia.  At home with family care.  Here for follow up.  Has f/u with neuro next month.  Prev imaging reports, labs reviewed.  A1c controlled at 6.3, off DM2 meds.   Prev with more edema on 20mg  of lasix, much improved on 40mg  a day.  Not SOB, no CP.   H/o anxiety, prev controlled with 1.5 tabs of sertraline a day.  Refill sent this AM.  No ADE from med.   PMH and SH reviewed  ROS: See HPI, otherwise noncontributory.  Meds, vitals, and allergies reviewed.   nad ncat Speech at baseline, pleasant in conversation Follows commands Mmm IRR, not tachy ctab abd soft.  Ext with minimal edema in feet CN 2-12 wnl B, S/S wnl x4 and DTR symmetrically flat No drift and proprioception wnl

## 2011-07-23 NOTE — Patient Instructions (Addendum)
Don't change your meds (keep taking 40mg  of furosemide a day) and keep the appointment with neuro.  Take care.   Recheck A1c and lipids in 4 months before a visit with Para March.

## 2011-07-24 NOTE — Discharge Summary (Addendum)
Physician Discharge Summary  Patient ID: [name] MRN: [id number] DOB/AGE: [birth year] 78 y.o.  Admit date: 07/13/2011 Discharge date: 07/24/2011  Primary Care Physician:  Crawford Givens, MD   Discharge Diagnoses:    CVA (cerebral infarction)  DEMENTIA  AF (atrial fibrillation) CAD s/p CABG Gout H/o Squamous cell CA HTN Dyslipidemia   Medication List  As of 07/24/2011 10:06 PM   STOP taking these medications         warfarin 2 MG tablet         TAKE these medications         allopurinol 100 MG tablet   Commonly known as: ZYLOPRIM   Take 100 mg by mouth daily.      ALPRAZolam 0.25 MG tablet   Commonly known as: XANAX   take 1 tablet by mouth twice a day      memantine 10 MG tablet   Commonly known as: NAMENDA   Take 10 mg by mouth 2 (two) times daily.      metoprolol 50 MG tablet   Commonly known as: LOPRESSOR   Take 1.5 tablets (75 mg total) by mouth 2 (two) times daily.      pantoprazole 40 MG tablet   Commonly known as: PROTONIX   take 1 tablet by mouth twice a day      rosuvastatin 20 MG tablet   Commonly known as: CRESTOR   Take 1 tablet (20 mg total) by mouth daily.          Xarelto 20mg  daily   Disposition and Follow-up:  PCP in 1 week Dr.Sethi in 1 month  Consults:Dr.Sethi Neurology  Significant Diagnostic Studies:  No results found.  Brief H and P: [name] is an 78 y.o. male with h/o dementia, CAD, afib on coumadin was in his usual state of health when he went to bed last pm. Today on awakening was having difficulty getting his thoughts out. Words were not understandable at times. Also seemed to have difficulty with his usual ADL''s such as figuring out how to get his pants unbuckled to use the bathroom. Symptoms did not clear and patient was brought to the ED for further evaluation by his son. Code stroke was called, his symptoms have since resolved.    Hospital Course:  1. CVA: MRI with Acute left parietal subcortical  and periventricular white matter infarction.  Symptoms appear to have resolved now  Started on Xarelto per Neurology recommendations instead of coumadin Carotid duplex/echo unremarkable Lipids, Hbaic are ok  2. DEMENTIA: stable, continue home meds, xanax PRN  3. AF (atrial fibrillation): in NSR, rate controlled, xarelto  4. H/o CAD: stable, continue betablocker/statin 5. Chronic systolic chf/EF of 45-50%: stable, continued on home dose of lasix,  metoprolol  Time spent on Discharge:  Signed: Develle Sievers [facility] 07/24/2011, 10:08 PM

## 2011-07-25 ENCOUNTER — Other Ambulatory Visit: Payer: Self-pay

## 2011-07-25 MED ORDER — ALLOPURINOL 100 MG PO TABS: 100.0000 mg | ORAL_TABLET | Freq: Every day | ORAL | Status: DC

## 2011-07-25 NOTE — Telephone Encounter (Signed)
Sent!

## 2011-07-25 NOTE — Telephone Encounter (Signed)
pts son called for refill allopurinol to [facility].Please advise.

## 2011-07-26 NOTE — Telephone Encounter (Signed)
Patient's son notified as instructed by telephone. 

## 2011-07-29 ENCOUNTER — Ambulatory Visit: Payer: Medicare Other

## 2011-08-11 ENCOUNTER — Other Ambulatory Visit: Payer: Self-pay | Admitting: Family Medicine

## 2011-08-13 ENCOUNTER — Ambulatory Visit: Payer: Self-pay | Admitting: Family Medicine

## 2011-08-13 DIAGNOSIS — I4891 Unspecified atrial fibrillation: Secondary | ICD-10-CM

## 2011-08-27 ENCOUNTER — Other Ambulatory Visit: Payer: Self-pay | Admitting: *Deleted

## 2011-08-27 DIAGNOSIS — Z48812 Encounter for surgical aftercare following surgery on the circulatory system: Secondary | ICD-10-CM

## 2011-08-27 DIAGNOSIS — I6529 Occlusion and stenosis of unspecified carotid artery: Secondary | ICD-10-CM

## 2011-09-02 ENCOUNTER — Encounter: Payer: Self-pay | Admitting: Neurosurgery

## 2011-09-03 ENCOUNTER — Other Ambulatory Visit (INDEPENDENT_AMBULATORY_CARE_PROVIDER_SITE_OTHER): Payer: Medicare Other

## 2011-09-03 ENCOUNTER — Ambulatory Visit (INDEPENDENT_AMBULATORY_CARE_PROVIDER_SITE_OTHER): Payer: Medicare Other | Admitting: Neurosurgery

## 2011-09-03 ENCOUNTER — Encounter: Payer: Self-pay | Admitting: Neurosurgery

## 2011-09-03 VITALS — BP 99/70 | HR 54 | Resp 16 | Ht 63.5 in | Wt 177.3 lb

## 2011-09-03 DIAGNOSIS — Z48812 Encounter for surgical aftercare following surgery on the circulatory system: Secondary | ICD-10-CM

## 2011-09-03 DIAGNOSIS — I6529 Occlusion and stenosis of unspecified carotid artery: Secondary | ICD-10-CM

## 2011-09-03 NOTE — Addendum Note (Signed)
Addended by: Sharee Pimple on: 09/03/2011 02:51 PM   Modules accepted: Orders

## 2011-09-03 NOTE — Progress Notes (Signed)
[facility] [place] Carotid Office Note  CC: Six-month carotid followup Referring Physician: Hart Rochester  History of Present Illness: 78 year old male patient of Dr. Hart Rochester followed for known carotid stenosis and a history of repair of a disrupted background patch at the right carotid endarterectomy site with replacement using vein patch from left leg, 08/2010. The patient denies signs and symptoms of CVA, TIA, amaurosis fugax or any neural deficit. The patient states she's done much better since he was discharge from the hospital. The patient denies any new medical diagnoses or recent surgeries.   Past Medical History  Diagnosis Date  . Anxiety   . Hypertension   . Hyperlipidemia   . CAD (coronary artery disease)   . Dementia   . Gout, unspecified 2012    crystal analysis at urgent care  . AF (atrial fibrillation)   . 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 Cancer 02/25/11    Squamous Cell Carcinoma- Left Dorsum Hand  . Type II or unspecified type diabetes mellitus without mention of complication, not stated as uncontrolled 06/18/2006  . Stroke 07/13/11    Acute left parietal subcortical and periventricular white matter infarction.    ROS: [x]  Positive   [ ]  Denies    General: [ ]  Weight loss, [ ]  Fever, [ ]  chills Neurologic: [ ]  Dizziness, [ ]  Blackouts, [ ]  Seizure [ ]  Stroke, [ ]  "Mini stroke", [ ]  Slurred speech, [ ]  Temporary blindness; [ ]  weakness in arms or legs, [ ]  Hoarseness Cardiac: [ ]  Chest pain/pressure, [ ]  Shortness of breath at rest [ ]  Shortness of breath with exertion, [ ]  Atrial fibrillation or irregular heartbeat Vascular: [ ]  Pain in legs with walking, [ ]  Pain in legs at rest, [ ]  Pain in legs at night,  [ ]  Non-healing ulcer, [ ]  Blood clot in vein/DVT,   Pulmonary: [ ]  Home oxygen, [ ]  Productive cough, [ ]  Coughing up blood, [ ]  Asthma,  [ ]  Wheezing Musculoskeletal:  [ ]  Arthritis, [ ]  Low back pain, [ ]  Joint  pain Hematologic: [ ]  Easy Bruising, [ ]  Anemia; [ ]  Hepatitis Gastrointestinal: [ ]  Blood in stool, [ ]  Gastroesophageal Reflux/heartburn, [ ]  Trouble swallowing Urinary: [ ]  chronic Kidney disease, [ ]  on HD - [ ]  MWF or [ ]  TTHS, [ ]  Burning with urination, [ ]  Difficulty urinating Skin: [ ]  Rashes, [ ]  Wounds Psychological: [ ]  Anxiety, [ ]  Depression   Social History History  Substance Use Topics  . Smoking status: Former Smoker    Types: Cigarettes  . Smokeless tobacco: Never Used   Comment: Remote mild smoking history  . Alcohol Use: No    Family History Family History  Problem Relation Age of Onset  . Colon cancer Sister   . Hypertension Brother   . Cancer Brother     intestine  . Brain cancer Brother   . Melanoma Sister   . Coronary artery disease Son     42, heart attack    Allergies  Allergen Reactions  . Lorazepam (Lorazepam)   . Naproxen Sodium     REACTION: rash all over  . Niacin     REACTION: unspecified  . Ramipril     REACTION: syncope  . Sulfonamide Derivatives     REACTION: unspecified (no HCTZ allergy)  . Zolpidem Tartrate (Zolpidem Tartrate)     Current Outpatient Prescriptions  Medication Sig Dispense Refill  .  allopurinol (ZYLOPRIM) 100 MG tablet Take 1 tablet (100 mg total) by mouth daily.  90 tablet  3  . ALPRAZolam (XANAX) 0.25 MG tablet take 1 tablet by mouth twice a day  60 tablet  3  . furosemide (LASIX) 40 MG tablet Take 1 tablet (40 mg total) by mouth daily.  30 tablet  12  . metoprolol (LOPRESSOR) 50 MG tablet Take 1.5 tablets (75 mg total) by mouth 2 (two) times daily.      Marland Kitchen NAMENDA 10 MG tablet take 1 tablet by mouth twice a day  60 tablet  3  . pantoprazole (PROTONIX) 40 MG tablet take 1 tablet by mouth twice a day  60 tablet  5  . Rivaroxaban (XARELTO) 15 MG TABS tablet Take 1 tablet (15 mg total) by mouth daily.  30 tablet  12  . rosuvastatin (CRESTOR) 20 MG tablet Take 1 tablet (20 mg total) by mouth daily.  30 tablet  5    . sertraline (ZOLOFT) 50 MG tablet take 1 and 1/2 tablet by mouth once daily  45 tablet  11    Physical Examination  Filed Vitals:   09/03/11 1048  BP: 99/70  Pulse: 54  Resp:     Body mass index is 30.91 kg/(m^2).  General:  WDWN in NAD Gait: Normal HEENT: WNL Eyes: Pupils equal Pulmonary: normal non-labored breathing , without Rales, rhonchi,  wheezing Cardiac: RRR, without  Murmurs, rubs or gallops; Abdomen: soft, NT, no masses Skin: no rashes, ulcers noted  Vascular Exam Pulses: 3+ radial pulses bilaterally Carotid bruits: Carotid pulses to auscultation no bruits are heard Extremities without ischemic changes, no Gangrene , no cellulitis; no open wounds;  Musculoskeletal: no muscle wasting or atrophy   Neurologic: A&O X 3; Appropriate Affect ; SENSATION: normal; MOTOR FUNCTION:  moving all extremities equally. Speech is fluent/normal  Non-Invasive Vascular Imaging CAROTID DUPLEX 09/03/2011  Right ICA 20 - 39 % stenosis Left ICA 20 - 39 % stenosis Dr. Hart Rochester reviewed this study and compared to previous studies.  ASSESSMENT/PLAN: Asymptomatic patient status post a outside study from [facility] hospital which shows minimal stenosis bilaterally in the carotids. The patient will followup here in one year with repeat carotid duplex. The patient's questions were encouraged and answered, he is in agreement with this plan.  Lauree Chandler ANP   Clinic MD: Hart Rochester

## 2011-10-01 ENCOUNTER — Other Ambulatory Visit: Payer: Self-pay | Admitting: Family Medicine

## 2011-10-02 NOTE — Telephone Encounter (Signed)
Please call in

## 2011-10-02 NOTE — Telephone Encounter (Signed)
Received refill request electronically. Last office visit 07/23/11. Is it okay to refill medication?

## 2011-10-03 ENCOUNTER — Other Ambulatory Visit: Payer: Self-pay | Admitting: Family Medicine

## 2011-10-03 NOTE — Telephone Encounter (Signed)
Medication phoned to pharmacy.  

## 2011-10-03 NOTE — Telephone Encounter (Signed)
Please call in. See prev refill note.

## 2011-10-03 NOTE — Telephone Encounter (Signed)
Dr. Para March didn't okay the refill until last evening.  It was phoned in right before lunch.

## 2011-10-03 NOTE — Telephone Encounter (Signed)
Pt son says that [facility] sent a request for the Alprazolam and they haven't received it back yet. He was wondering if this could be refilled and sent in.

## 2011-11-25 ENCOUNTER — Ambulatory Visit: Payer: Medicare Other | Admitting: Family Medicine

## 2011-11-25 ENCOUNTER — Other Ambulatory Visit (INDEPENDENT_AMBULATORY_CARE_PROVIDER_SITE_OTHER): Payer: Medicare Other

## 2011-11-25 DIAGNOSIS — E119 Type 2 diabetes mellitus without complications: Secondary | ICD-10-CM

## 2011-11-25 LAB — LIPID PANEL
Cholesterol: 133 mg/dL (ref 0–200)
LDL Cholesterol: 64 mg/dL (ref 0–99)
Triglycerides: 156 mg/dL — ABNORMAL HIGH (ref 0.0–149.0)
VLDL: 31.2 mg/dL (ref 0.0–40.0)

## 2011-11-25 LAB — COMPREHENSIVE METABOLIC PANEL
AST: 19 U/L (ref 0–37)
Albumin: 3.5 g/dL (ref 3.5–5.2)
Alkaline Phosphatase: 63 U/L (ref 39–117)
BUN: 14 mg/dL (ref 6–23)
Calcium: 9.1 mg/dL (ref 8.4–10.5)
Chloride: 98 mEq/L (ref 96–112)
Creatinine, Ser: 1.5 mg/dL (ref 0.4–1.5)
Glucose, Bld: 112 mg/dL — ABNORMAL HIGH (ref 70–99)
Potassium: 3.6 mEq/L (ref 3.5–5.1)

## 2011-11-25 LAB — HEMOGLOBIN A1C: Hgb A1c MFr Bld: 6.3 % (ref 4.6–6.5)

## 2011-11-28 ENCOUNTER — Other Ambulatory Visit: Payer: Self-pay | Admitting: Family Medicine

## 2011-12-02 ENCOUNTER — Encounter: Payer: Self-pay | Admitting: Family Medicine

## 2011-12-02 ENCOUNTER — Ambulatory Visit (INDEPENDENT_AMBULATORY_CARE_PROVIDER_SITE_OTHER): Payer: Medicare Other | Admitting: Family Medicine

## 2011-12-02 VITALS — BP 112/68 | HR 65 | Temp 97.9°F | Wt 180.8 lb

## 2011-12-02 DIAGNOSIS — E119 Type 2 diabetes mellitus without complications: Secondary | ICD-10-CM

## 2011-12-02 DIAGNOSIS — I10 Essential (primary) hypertension: Secondary | ICD-10-CM

## 2011-12-02 DIAGNOSIS — Z23 Encounter for immunization: Secondary | ICD-10-CM

## 2011-12-02 DIAGNOSIS — F068 Other specified mental disorders due to known physiological condition: Secondary | ICD-10-CM

## 2011-12-02 DIAGNOSIS — E785 Hyperlipidemia, unspecified: Secondary | ICD-10-CM

## 2011-12-02 NOTE — Assessment & Plan Note (Signed)
A1c at goal, recheck in 6 months.  No meds.  Continue as is.

## 2011-12-02 NOTE — Assessment & Plan Note (Signed)
Reasonable control, continue current meds.  Labs discussed.

## 2011-12-02 NOTE — Assessment & Plan Note (Signed)
Good control, continue current meds.  Labs discussed.

## 2011-12-02 NOTE — Assessment & Plan Note (Signed)
Continues slow decline.  Continue current meds, await neuro input.

## 2011-12-02 NOTE — Patient Instructions (Addendum)
See about getting in the foot clinic- tell them about the sugar and blood thinners.   Recheck in 6 months with labs ahead of time.

## 2011-12-02 NOTE — Progress Notes (Signed)
Diabetes:  No meds Hypoglycemic episodes:no Hyperglycemic episodes:no Feet problems:no A1c d/w pt and family- stable at 6.3  Hypertension:    Using medication without problems or lightheadedness: yes Chest pain with exertion:no Edema:no Short of breath:no  Elevated Cholesterol: Using medications without problems:yes Muscle aches: no Diet compliance:yes Exercise:limited  Had f/u with neuro this AM, awaiting records. Dementia continues, with slow declined.  Family notes delay in responses now.  He knows the month but not the year, 0/3 on recall.   PMH and SH reviewed.   Vital signs, Meds and allergies reviewed.  ROS: See HPI.  Otherwise nontributory.   GEN: nad, alert- orientation as above HEENT: mucous membranes moist NECK: supple w/o LA CV: sounds to be rrr. PULM: ctab, no inc wob ABD: soft, +bs EXT: no edema SKIN: no acute rash  Diabetic foot exam: Normal inspection No skin breakdown No calluses  Normal DP pulses Normal sensation to light tough and monofilament B 1st nails ingrown

## 2011-12-05 ENCOUNTER — Other Ambulatory Visit: Payer: Self-pay | Admitting: Family Medicine

## 2011-12-05 MED ORDER — DONEPEZIL HCL 10 MG PO TABS: 10.0000 mg | ORAL_TABLET | Freq: Every evening | ORAL | Status: DC | PRN

## 2011-12-11 ENCOUNTER — Other Ambulatory Visit: Payer: Self-pay | Admitting: Family Medicine

## 2012-01-30 ENCOUNTER — Other Ambulatory Visit: Payer: Self-pay | Admitting: Family Medicine

## 2012-01-31 ENCOUNTER — Other Ambulatory Visit: Payer: Self-pay

## 2012-01-31 NOTE — Telephone Encounter (Signed)
Electronic refill request.  Please advise. 

## 2012-01-31 NOTE — Telephone Encounter (Signed)
pts son notified by phone med [facility] [address].

## 2012-01-31 NOTE — Telephone Encounter (Signed)
pts son [name] request status on alprazolam refill to [facility]. Refill already requested 01/30/12 phone note.

## 2012-01-31 NOTE — Telephone Encounter (Signed)
Px written for call in  - can refil once in Dr Ashok Cordia absence

## 2012-01-31 NOTE — Telephone Encounter (Signed)
That request just came to me this morning.  I did put it in to Dr. Milinda Antis (I think).

## 2012-01-31 NOTE — Telephone Encounter (Signed)
Medication phoned to [facility] as instructed. pts son notified med called in.

## 2012-03-02 ENCOUNTER — Other Ambulatory Visit: Payer: Self-pay

## 2012-03-02 ENCOUNTER — Other Ambulatory Visit: Payer: Self-pay | Admitting: Family Medicine

## 2012-03-02 MED ORDER — ALPRAZOLAM 0.25 MG PO TABS: ORAL_TABLET | ORAL | Status: DC

## 2012-03-02 NOTE — Telephone Encounter (Signed)
Please call in

## 2012-03-02 NOTE — Telephone Encounter (Signed)
pts son request refill on alprazolam to [facility] [address]. Please advise. Pt is out of med and request called in today. P[facility] to Dr Viviann Spare; [facility] Para March is PCP. [name] request call back when med refilled.

## 2012-03-03 ENCOUNTER — Other Ambulatory Visit: Payer: Self-pay | Admitting: Family Medicine

## 2012-03-03 NOTE — Telephone Encounter (Signed)
Medication phoned to pharmacy.  Son [name]) advised.

## 2012-03-03 NOTE — Telephone Encounter (Signed)
Pt's son, [name], called regarding pt's alprazolam refill.  He says he requested this on Friday and as of today, [facility] hadn't [facility].  I spoke with Kathlene November at [facility] (who confirmed they had not received refill) and phoned in rx.  Pt's son was very upset at first, stating that he goes through this every month.  I explained the situation and apologized for any inconvenience.  He verbalized understanding and seemed better at the end of the telephone call.

## 2012-06-25 ENCOUNTER — Other Ambulatory Visit: Payer: Self-pay | Admitting: Family Medicine

## 2012-06-26 NOTE — Telephone Encounter (Signed)
Please call in.  Needs CPE scheduled.  

## 2012-06-26 NOTE — Telephone Encounter (Signed)
Electronic refill request.  Please advise. 

## 2012-06-26 NOTE — Telephone Encounter (Signed)
Medication phoned to pharmacy.  Patient advised that CPE needs to be scheduled.  He will have his sister (who drives him) to call next week for a CPE appt.

## 2012-07-03 ENCOUNTER — Encounter: Payer: Self-pay | Admitting: Radiology

## 2012-07-06 ENCOUNTER — Other Ambulatory Visit (INDEPENDENT_AMBULATORY_CARE_PROVIDER_SITE_OTHER): Payer: Medicare Other

## 2012-07-06 DIAGNOSIS — E119 Type 2 diabetes mellitus without complications: Secondary | ICD-10-CM

## 2012-07-06 LAB — HEMOGLOBIN A1C: Hgb A1c MFr Bld: 6.3 % (ref 4.6–6.5)

## 2012-07-08 ENCOUNTER — Encounter: Payer: Self-pay | Admitting: Radiology

## 2012-07-09 ENCOUNTER — Encounter: Payer: Self-pay | Admitting: Family Medicine

## 2012-07-09 ENCOUNTER — Ambulatory Visit (INDEPENDENT_AMBULATORY_CARE_PROVIDER_SITE_OTHER): Payer: Medicare Other | Admitting: Family Medicine

## 2012-07-09 VITALS — BP 118/84 | HR 76 | Temp 98.3°F | Ht 63.75 in | Wt 183.2 lb

## 2012-07-09 DIAGNOSIS — I10 Essential (primary) hypertension: Secondary | ICD-10-CM

## 2012-07-09 DIAGNOSIS — I4891 Unspecified atrial fibrillation: Secondary | ICD-10-CM

## 2012-07-09 DIAGNOSIS — K219 Gastro-esophageal reflux disease without esophagitis: Secondary | ICD-10-CM

## 2012-07-09 DIAGNOSIS — F068 Other specified mental disorders due to known physiological condition: Secondary | ICD-10-CM

## 2012-07-09 DIAGNOSIS — E119 Type 2 diabetes mellitus without complications: Secondary | ICD-10-CM

## 2012-07-09 DIAGNOSIS — Z0000 Encounter for general adult medical examination without abnormal findings: Secondary | ICD-10-CM

## 2012-07-09 MED ORDER — ZOSTER VACCINE LIVE 19400 UNT/0.65ML ~~LOC~~ SOLR: 0.6500 mL | Freq: Once | SUBCUTANEOUS | Status: DC

## 2012-07-09 MED ORDER — FUROSEMIDE 40 MG PO TABS: 40.0000 mg | ORAL_TABLET | Freq: Every day | ORAL | Status: DC

## 2012-07-09 MED ORDER — ALLOPURINOL 100 MG PO TABS: 100.0000 mg | ORAL_TABLET | Freq: Every day | ORAL | Status: DC

## 2012-07-09 MED ORDER — SERTRALINE HCL 50 MG PO TABS: ORAL_TABLET | ORAL | Status: DC

## 2012-07-09 MED ORDER — ROSUVASTATIN CALCIUM 20 MG PO TABS: ORAL_TABLET | ORAL | Status: DC

## 2012-07-09 MED ORDER — MEMANTINE HCL ER 28 MG PO CP24: 28.0000 mg | ORAL_CAPSULE | Freq: Every day | ORAL | Status: DC

## 2012-07-09 MED ORDER — METOPROLOL TARTRATE 50 MG PO TABS: 75.0000 mg | ORAL_TABLET | Freq: Two times a day (BID) | ORAL | Status: DC

## 2012-07-09 MED ORDER — PANTOPRAZOLE SODIUM 40 MG PO TBEC: DELAYED_RELEASE_TABLET | ORAL | Status: DC

## 2012-07-09 MED ORDER — RIVAROXABAN 15 MG PO TABS: 15.0000 mg | ORAL_TABLET | Freq: Every day | ORAL | Status: DC

## 2012-07-09 MED ORDER — ALPRAZOLAM 0.25 MG PO TABS: 0.1250 mg | ORAL_TABLET | Freq: Two times a day (BID) | ORAL | Status: DC | PRN

## 2012-07-09 NOTE — Patient Instructions (Addendum)
Note the change on the namenda.  The twice a day dosing isn't available now.  Try to cut back on the alprazolam, 1/2 to 1 tab up to twice a day.  I sent the other meds to the pharmacy.  Recheck in about 6 months.  I would call about an appointment with cardiology and the eye clinic.   Check with your insurance to see if they will cover the shingles shot. I would get a flu shot each fall.   Take care.

## 2012-07-09 NOTE — Progress Notes (Signed)
I have personally reviewed the Medicare Annual Wellness questionnaire and have noted 1. The patient's medical and social history 2. Their use of alcohol, tobacco or illicit drugs 3. Their current medications and supplements 4. The patient's functional ability including ADL's, fall risks, home safety risks and hearing or visual             impairment. 5. Diet and physical activities 6. Evidence for depression or mood disorders  The patients weight, height, BMI have been recorded in the chart and visual acuity is per eye clinic.  I have made referrals, counseling and provided education to the patient based review of the above and I have provided the pt with a written personalized care plan for preventive services.  See scanned forms.  Routine anticipatory guidance given to patient.  See health maintenance. Flu 2013 Shingles d/w pt and family.  PNA 2012 Tetanus 2006 Colonoscopy not indicated.  Prostate cancer screening not indicated. Cognitive function addressed- see scanned forms- and if abnormal then additional documentation follows.   Diabetes:  No meds.  Hypoglycemic episodes:no Hyperglycemic episodes:no A1c stable.  D/w pt and family.   Dementia.  Continues on namenda w/o ADE.  Family monitors patient.  Not driving.  No acute changes but memory, esp short term, is failing.  Still able to be cared for at home.    Hypertension:    Using medication without problems or lightheadedness: yes Chest pain with exertion:no Edema:no Short of breath:no  AF.  No bleeding, no CP, not SOB.  Compliant with meds. D/w pt about routine cards f/u.    GERD controlled with current meds.  No dysphagia.    Anxiety controlled with prn BZD.  D/w pt and family re: fall precautions and mood. Doing well.  Will try to taper BZD.   PMH and SH reviewed  Meds, vitals, and allergies reviewed.   ROS: See HPI.  Otherwise negative.    GEN: nad, alert but not oriented HEENT: mucous membranes moist NECK:  supple w/o LA CV: rrr. Murmur noted PULM: ctab, no inc wob ABD: soft, +bs EXT: no edema SKIN: no acute rash

## 2012-07-10 DIAGNOSIS — Z0000 Encounter for general adult medical examination without abnormal findings: Secondary | ICD-10-CM | POA: Insufficient documentation

## 2012-07-10 NOTE — Assessment & Plan Note (Signed)
Controlled, continue current meds.   

## 2012-07-10 NOTE — Assessment & Plan Note (Signed)
He sounds to be regular today.  Continue current meds.  Family will check on cards f/u.

## 2012-07-10 NOTE — Assessment & Plan Note (Signed)
See scanned forms.  Routine anticipatory guidance given to patient.  See health maintenance. Flu 2013 Shingles d/w pt and family.  PNA 2012 Tetanus 2006 Colonoscopy not indicated.  Prostate cancer screening not indicated. Cognitive function addressed- see scanned forms- and if abnormal then additional documentation follows.

## 2012-07-10 NOTE — Assessment & Plan Note (Signed)
No meds, controlled, recheck episodically.  Labs d/w pt and family.  Would avoid ACE given his allergy hx.

## 2012-07-10 NOTE — Assessment & Plan Note (Signed)
Family monitors patient during the day.  Continue namenda.  Would change to daily dosing.  Would expect slow decline; appears okay to continue at home for now.

## 2012-07-16 ENCOUNTER — Other Ambulatory Visit: Payer: Self-pay | Admitting: Family Medicine

## 2012-08-06 ENCOUNTER — Other Ambulatory Visit: Payer: Self-pay | Admitting: Dermatology

## 2012-09-03 ENCOUNTER — Ambulatory Visit: Payer: Medicare Other | Admitting: Neurosurgery

## 2012-09-03 ENCOUNTER — Other Ambulatory Visit (INDEPENDENT_AMBULATORY_CARE_PROVIDER_SITE_OTHER): Payer: Medicare Other | Admitting: *Deleted

## 2012-09-03 DIAGNOSIS — Z48812 Encounter for surgical aftercare following surgery on the circulatory system: Secondary | ICD-10-CM

## 2012-09-03 DIAGNOSIS — I6529 Occlusion and stenosis of unspecified carotid artery: Secondary | ICD-10-CM

## 2012-09-07 ENCOUNTER — Other Ambulatory Visit: Payer: Self-pay | Admitting: *Deleted

## 2012-09-07 DIAGNOSIS — Z48812 Encounter for surgical aftercare following surgery on the circulatory system: Secondary | ICD-10-CM

## 2012-09-08 ENCOUNTER — Encounter: Payer: Self-pay | Admitting: Vascular Surgery

## 2012-10-08 ENCOUNTER — Telehealth: Payer: Self-pay

## 2012-10-08 NOTE — Telephone Encounter (Signed)
Delice Bison with [name] [address] left v/m Namenda XR is on Manufacturer back order. Pt still has refills of Namenda 10 mg taking one tab twice a day. Does Dr Para March want pt to take Namenda 10 mg taking twice a day. Tara request cb.

## 2012-10-08 NOTE — Telephone Encounter (Signed)
Yes, please.  Continue Namenda 10 mg  twice a day. Please update med list.  Thanks.

## 2012-10-08 NOTE — Telephone Encounter (Signed)
Advise pharmacy it's okay to fill the Nameda 10mg 

## 2012-11-30 ENCOUNTER — Ambulatory Visit (INDEPENDENT_AMBULATORY_CARE_PROVIDER_SITE_OTHER): Payer: Medicare Other

## 2012-11-30 DIAGNOSIS — Z23 Encounter for immunization: Secondary | ICD-10-CM

## 2012-12-07 ENCOUNTER — Ambulatory Visit (INDEPENDENT_AMBULATORY_CARE_PROVIDER_SITE_OTHER): Payer: Medicare Other | Admitting: Podiatry

## 2012-12-07 ENCOUNTER — Encounter: Payer: Self-pay | Admitting: Podiatry

## 2012-12-07 VITALS — BP 122/62 | HR 60 | Resp 16

## 2012-12-07 DIAGNOSIS — B351 Tinea unguium: Secondary | ICD-10-CM

## 2012-12-07 DIAGNOSIS — M79609 Pain in unspecified limb: Secondary | ICD-10-CM

## 2012-12-08 NOTE — Progress Notes (Signed)
Subjective:     Patient ID: [name], male   DOB: [birth year], 79 y.o.   MRN: [id number]  HPI patient states trim I toenails they are so thick and painful   Review of Systems     Objective:   Physical Exam  Nursing note and vitals reviewed. Constitutional: He is oriented to person, place, and time.  Musculoskeletal: Normal range of motion.  Neurological: He is oriented to person, place, and time.  Skin: Skin is warm.   thick deformed nails with pain 1-5 both feet    Assessment:     Mycotic nail infection with pain 1-5 both feet    Plan:     Debridement of nails 1-5 both feet no iatrogenic bleeding noted

## 2012-12-20 ENCOUNTER — Other Ambulatory Visit: Payer: Self-pay | Admitting: Family Medicine

## 2012-12-21 NOTE — Telephone Encounter (Signed)
Due for 6 months f/u, needs 30 min OV.  Thanks.  Please call in.

## 2012-12-21 NOTE — Telephone Encounter (Signed)
Electronic refill request.  Please advise. 

## 2012-12-22 NOTE — Telephone Encounter (Signed)
Medication phoned to pharmacy. Appt scheduled.

## 2013-01-05 ENCOUNTER — Other Ambulatory Visit: Payer: Self-pay | Admitting: Family Medicine

## 2013-01-06 NOTE — Telephone Encounter (Signed)
Received refill request electronically from pharmacy. Request does not match medication sheet.  Please confirm that medication has been changed and this refill should be denied.

## 2013-01-07 NOTE — Telephone Encounter (Signed)
See prev phone note.  10mg  bid. Sent.

## 2013-01-18 ENCOUNTER — Encounter: Payer: Self-pay | Admitting: Radiology

## 2013-01-19 ENCOUNTER — Ambulatory Visit (INDEPENDENT_AMBULATORY_CARE_PROVIDER_SITE_OTHER): Payer: Medicare Other | Admitting: Family Medicine

## 2013-01-19 ENCOUNTER — Encounter: Payer: Self-pay | Admitting: Family Medicine

## 2013-01-19 VITALS — BP 100/66 | HR 68 | Temp 98.4°F | Wt 182.5 lb

## 2013-01-19 DIAGNOSIS — F068 Other specified mental disorders due to known physiological condition: Secondary | ICD-10-CM

## 2013-01-19 DIAGNOSIS — I4891 Unspecified atrial fibrillation: Secondary | ICD-10-CM

## 2013-01-19 DIAGNOSIS — E119 Type 2 diabetes mellitus without complications: Secondary | ICD-10-CM

## 2013-01-19 DIAGNOSIS — F411 Generalized anxiety disorder: Secondary | ICD-10-CM

## 2013-01-19 LAB — LIPID PANEL
Total CHOL/HDL Ratio: 4
Triglycerides: 240 mg/dL — ABNORMAL HIGH (ref 0.0–149.0)

## 2013-01-19 LAB — COMPREHENSIVE METABOLIC PANEL
AST: 23 U/L (ref 0–37)
Albumin: 3.7 g/dL (ref 3.5–5.2)
BUN: 14 mg/dL (ref 6–23)
Calcium: 8.9 mg/dL (ref 8.4–10.5)
Chloride: 96 mEq/L (ref 96–112)
Glucose, Bld: 161 mg/dL — ABNORMAL HIGH (ref 70–99)
Potassium: 3.3 mEq/L — ABNORMAL LOW (ref 3.5–5.1)
Sodium: 137 mEq/L (ref 135–145)
Total Protein: 6.9 g/dL (ref 6.0–8.3)

## 2013-01-19 LAB — HEMOGLOBIN A1C: Hgb A1c MFr Bld: 6.3 % (ref 4.6–6.5)

## 2013-01-19 NOTE — Progress Notes (Signed)
Pre-visit discussion using our clinic review tool. No additional management support is needed unless otherwise documented below in the visit note.  Dementia. Memory loss progressive.  Family noting decline in memory.  Not cooking or driving.  Able to feed self and toiled.  Near constant supervision.  Needs help with bathing, done at the sink.  occ sleep disruption. Tends to sleep longer in AM now.  Less social, esp in crowds.  Family considering options re: care at home. No agitation with BZD use.  No ADE on meds.  No falls.   Diabetes:  No meds Hypoglycemic episodes:no Hyperglycemic episodes:no Feet problems:no Blood Sugars averaging:not checked often Due for labs. Nonfasting. See notes on labs.   PMH and SH reviewed  Meds, vitals, and allergies reviewed.   ROS: See HPI.  Otherwise negative.    GEN: nad, alert but not oriented HEENT: mucous membranes moist NECK: supple w/o LA CV: rrr. Murmur noted PULM: ctab, no inc wob ABD: soft, +bs EXT: no edema SKIN: no acute rash  Diabetic foot exam: Normal inspection except for dry skin No skin breakdown No calluses  1+ DP pulses Normal sensation to light touch and monofilament Nails thickened

## 2013-01-19 NOTE — Patient Instructions (Signed)
Don't change your meds for now.  Go to the lab on the way out.  We'll contact you with your lab report. Recheck in about 6 months.  visit. Take care.

## 2013-01-20 NOTE — Assessment & Plan Note (Signed)
No meds.  See notes on labs. 

## 2013-01-20 NOTE — Assessment & Plan Note (Signed)
Safe at home. Would expect slow decline.  Family is monitoring him, considering options. D/w pt's son today.  Continue current meds.

## 2013-01-20 NOTE — Assessment & Plan Note (Signed)
No bleeding or ADE on meds. Not lightheaded. He wanted to have f/u here and not with cards, to simplify his appointment schedule.  I am okay with this as long as cards is.  I'll check on this.  Continue current meds.  See notes on labs.

## 2013-01-20 NOTE — Assessment & Plan Note (Addendum)
Continue as is with BZD. No ADE.

## 2013-01-28 IMAGING — RF DG INTRO LONG GI TUBE
3 series · 3 of 3 positions shown · IV contrast (agent unspecified)
Comparison: None

CLINICAL DATA: feeding tube placement

LONG GI TUBE PLACMENT
Fluoroscopy Time: 5 minutes 15 seconds
Contrast: 10 ml Pmnipaque-AH[facility]

[Series 1: run · 1 of 1 slices shown (1 of 3)]
[im 1/1]
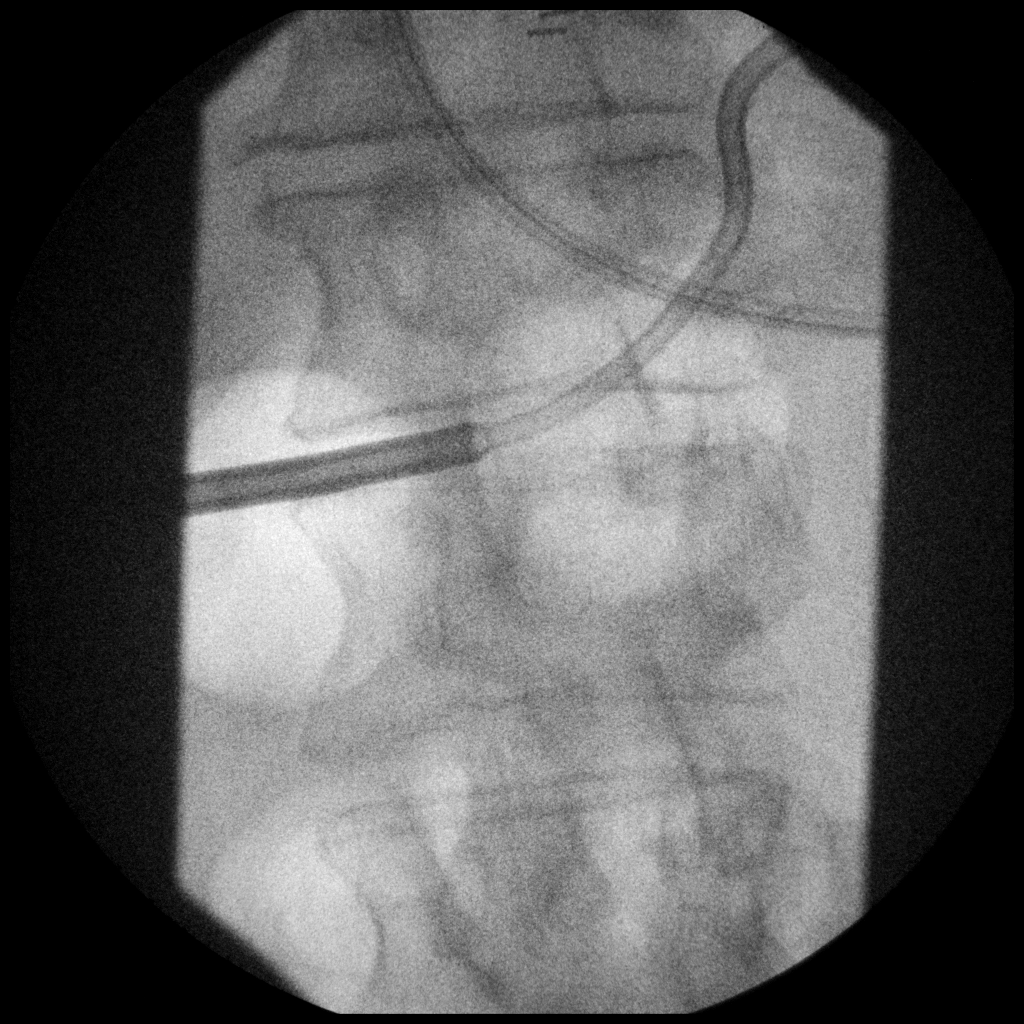

[Series 2: run · 1 of 1 slices shown (2 of 3)]
[im 1/1]
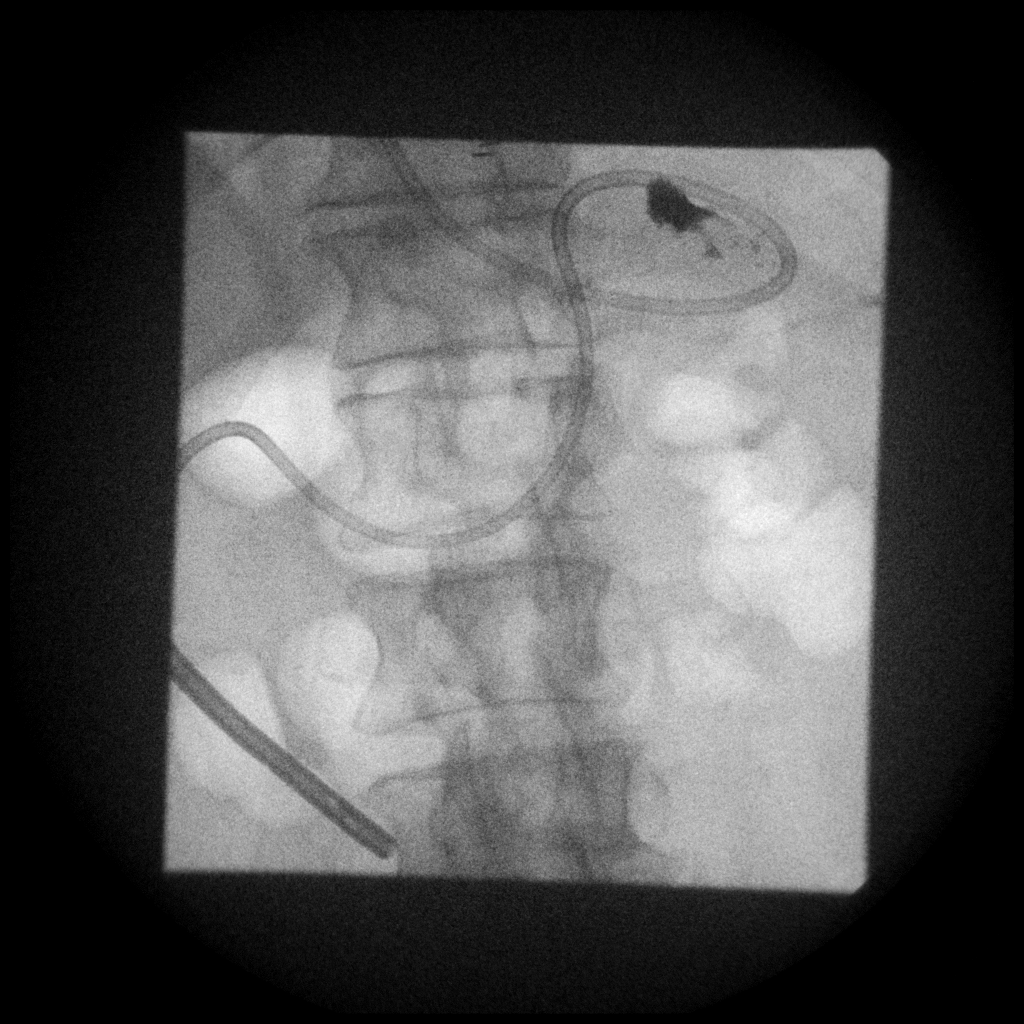

[Series 3: run · 1 of 1 slices shown (3 of 3)]
[im 1/1]
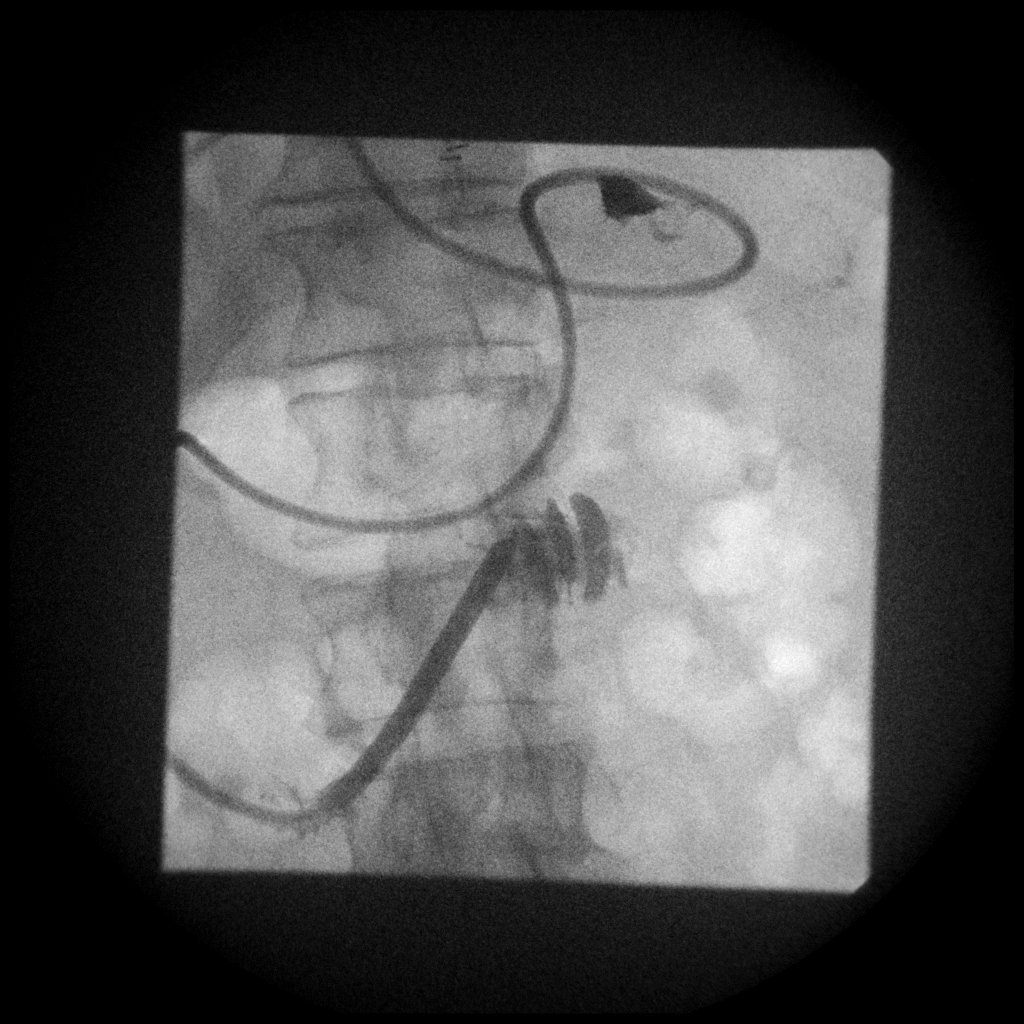

[3 of 3 positions shown; findings below may reference images not displayed]

FINDINGS: Feeding tube was advanced into the stomach and the distal
duodenum under direct fluoroscopic guidance.  10 ml of contrast was
injected through the feeding tube confirming placement.  No
complications.
IMPRESSION: 1.  Successful placement of feeding tube with tip at the level of
the distal duodenum.

## 2013-03-01 ENCOUNTER — Encounter: Payer: Self-pay | Admitting: Podiatry

## 2013-03-01 ENCOUNTER — Ambulatory Visit: Payer: Medicare Other | Admitting: Podiatry

## 2013-03-01 ENCOUNTER — Ambulatory Visit (INDEPENDENT_AMBULATORY_CARE_PROVIDER_SITE_OTHER): Payer: Medicare Other | Admitting: Podiatry

## 2013-03-01 VITALS — BP 99/69 | HR 59 | Resp 16

## 2013-03-01 DIAGNOSIS — B351 Tinea unguium: Secondary | ICD-10-CM

## 2013-03-01 DIAGNOSIS — M79609 Pain in unspecified limb: Secondary | ICD-10-CM

## 2013-03-01 NOTE — Progress Notes (Signed)
° °  Subjective:    Patient ID: [name], male    DOB: May 21, 1924, 80 y.o.   MRN: [id number]  HPI I am needing a good trim of my nails  This patient presents for ongoing debridement of mycotic toenails. The last visit for this service was 12/08/2012 provided by DR. Regal. Review of Systems     Objective:   Physical Exam Dermatological: Discolored, hypertrophic, incurvated toenails x10.       Assessment & Plan:   Assessment: Symptomatic onychomycoses x10  Plan: Debridement of toenails x10 without a bleeding. Reappoint at three-month intervals

## 2013-03-08 ENCOUNTER — Other Ambulatory Visit: Payer: Self-pay | Admitting: Dermatology

## 2013-03-10 ENCOUNTER — Encounter: Payer: Self-pay | Admitting: Family Medicine

## 2013-03-10 DIAGNOSIS — C4491 Basal cell carcinoma of skin, unspecified: Secondary | ICD-10-CM | POA: Insufficient documentation

## 2013-03-21 ENCOUNTER — Emergency Department (HOSPITAL_COMMUNITY)
Admission: EM | Admit: 2013-03-21 | Discharge: 2013-03-21 | Disposition: A | Discharge status: Home / Self Care | Payer: Medicare Other | Attending: Emergency Medicine | Admitting: Emergency Medicine

## 2013-03-21 ENCOUNTER — Encounter (HOSPITAL_COMMUNITY): Payer: Self-pay | Admitting: Emergency Medicine

## 2013-03-21 ENCOUNTER — Emergency Department (HOSPITAL_COMMUNITY): Payer: Medicare Other

## 2013-03-21 DIAGNOSIS — I10 Essential (primary) hypertension: Secondary | ICD-10-CM | POA: Insufficient documentation

## 2013-03-21 DIAGNOSIS — F29 Unspecified psychosis not due to a substance or known physiological condition: Secondary | ICD-10-CM | POA: Insufficient documentation

## 2013-03-21 DIAGNOSIS — F0390 Unspecified dementia without behavioral disturbance: Secondary | ICD-10-CM | POA: Insufficient documentation

## 2013-03-21 DIAGNOSIS — R110 Nausea: Secondary | ICD-10-CM | POA: Insufficient documentation

## 2013-03-21 DIAGNOSIS — F411 Generalized anxiety disorder: Secondary | ICD-10-CM | POA: Insufficient documentation

## 2013-03-21 DIAGNOSIS — R443 Hallucinations, unspecified: Secondary | ICD-10-CM | POA: Insufficient documentation

## 2013-03-21 DIAGNOSIS — M109 Gout, unspecified: Secondary | ICD-10-CM | POA: Insufficient documentation

## 2013-03-21 DIAGNOSIS — I2510 Atherosclerotic heart disease of native coronary artery without angina pectoris: Secondary | ICD-10-CM | POA: Insufficient documentation

## 2013-03-21 DIAGNOSIS — E119 Type 2 diabetes mellitus without complications: Secondary | ICD-10-CM | POA: Insufficient documentation

## 2013-03-21 DIAGNOSIS — Z7901 Long term (current) use of anticoagulants: Secondary | ICD-10-CM | POA: Insufficient documentation

## 2013-03-21 DIAGNOSIS — IMO0002 Reserved for concepts with insufficient information to code with codable children: Principal | ICD-10-CM

## 2013-03-21 DIAGNOSIS — Z85828 Personal history of other malignant neoplasm of skin: Secondary | ICD-10-CM | POA: Insufficient documentation

## 2013-03-21 DIAGNOSIS — Z87891 Personal history of nicotine dependence: Secondary | ICD-10-CM | POA: Insufficient documentation

## 2013-03-21 DIAGNOSIS — Z8673 Personal history of transient ischemic attack (TIA), and cerebral infarction without residual deficits: Secondary | ICD-10-CM | POA: Insufficient documentation

## 2013-03-21 DIAGNOSIS — Z951 Presence of aortocoronary bypass graft: Secondary | ICD-10-CM | POA: Insufficient documentation

## 2013-03-21 DIAGNOSIS — M1710 Unilateral primary osteoarthritis, unspecified knee: Secondary | ICD-10-CM | POA: Insufficient documentation

## 2013-03-21 DIAGNOSIS — M79609 Pain in unspecified limb: Secondary | ICD-10-CM | POA: Insufficient documentation

## 2013-03-21 DIAGNOSIS — E785 Hyperlipidemia, unspecified: Secondary | ICD-10-CM | POA: Insufficient documentation

## 2013-03-21 DIAGNOSIS — M1990 Unspecified osteoarthritis, unspecified site: Secondary | ICD-10-CM

## 2013-03-21 DIAGNOSIS — Z79899 Other long term (current) drug therapy: Secondary | ICD-10-CM | POA: Insufficient documentation

## 2013-03-21 DIAGNOSIS — I4891 Unspecified atrial fibrillation: Secondary | ICD-10-CM | POA: Insufficient documentation

## 2013-03-21 DIAGNOSIS — R410 Disorientation, unspecified: Secondary | ICD-10-CM

## 2013-03-21 LAB — URINALYSIS, ROUTINE W REFLEX MICROSCOPIC
GLUCOSE, UA: NEGATIVE mg/dL
Hgb urine dipstick: NEGATIVE
Ketones, ur: NEGATIVE mg/dL
LEUKOCYTES UA: NEGATIVE
NITRITE: NEGATIVE
Protein, ur: NEGATIVE mg/dL
Specific Gravity, Urine: 1.022 (ref 1.005–1.030)
Urobilinogen, UA: 1 mg/dL (ref 0.0–1.0)
pH: 6.5 (ref 5.0–8.0)

## 2013-03-21 LAB — CBC WITH DIFFERENTIAL/PLATELET
Basophils Absolute: 0 10*3/uL (ref 0.0–0.1)
Basophils Relative: 0 % (ref 0–1)
Eosinophils Absolute: 0.2 10*3/uL (ref 0.0–0.7)
Eosinophils Relative: 2 % (ref 0–5)
HEMATOCRIT: 38.5 % — AB (ref 39.0–52.0)
HEMOGLOBIN: 13.3 g/dL (ref 13.0–17.0)
Lymphocytes Relative: 21 % (ref 12–46)
Lymphs Abs: 1.7 10*3/uL (ref 0.7–4.0)
MCH: 32 pg (ref 26.0–34.0)
MCHC: 34.5 g/dL (ref 30.0–36.0)
MCV: 92.5 fL (ref 78.0–100.0)
MONO ABS: 0.8 10*3/uL (ref 0.1–1.0)
MONOS PCT: 10 % (ref 3–12)
NEUTROS ABS: 5.3 10*3/uL (ref 1.7–7.7)
Neutrophils Relative %: 66 % (ref 43–77)
Platelets: 186 10*3/uL (ref 150–400)
RBC: 4.16 MIL/uL — ABNORMAL LOW (ref 4.22–5.81)
RDW: 14.5 % (ref 11.5–15.5)
WBC: 8 10*3/uL (ref 4.0–10.5)

## 2013-03-21 LAB — POCT I-STAT TROPONIN I: Troponin i, poc: 0.01 ng/mL (ref 0.00–0.08)

## 2013-03-21 LAB — COMPREHENSIVE METABOLIC PANEL
ALBUMIN: 3.6 g/dL (ref 3.5–5.2)
ALT: 11 U/L (ref 0–53)
AST: 19 U/L (ref 0–37)
Alkaline Phosphatase: 81 U/L (ref 39–117)
BILIRUBIN TOTAL: 0.9 mg/dL (ref 0.3–1.2)
BUN: 15 mg/dL (ref 6–23)
CHLORIDE: 102 meq/L (ref 96–112)
CO2: 30 mEq/L (ref 19–32)
CREATININE: 1.42 mg/dL — AB (ref 0.50–1.35)
Calcium: 9.4 mg/dL (ref 8.4–10.5)
GFR calc non Af Amer: 43 mL/min — ABNORMAL LOW (ref >90)
GFR, EST AFRICAN AMERICAN: 50 mL/min — AB (ref >90)
Glucose, Bld: 113 mg/dL — ABNORMAL HIGH (ref 70–99)
Potassium: 4.3 mEq/L (ref 3.7–5.3)
Sodium: 143 mEq/L (ref 137–147)
Total Protein: 7 g/dL (ref 6.0–8.3)

## 2013-03-21 LAB — GLUCOSE, CAPILLARY: GLUCOSE-CAPILLARY: 102 mg/dL — AB (ref 70–99)

## 2013-03-21 NOTE — ED Provider Notes (Signed)
CSN: [id number]     Arrival date & time 03/21/13  1044 History   First MD Initiated Contact with Patient 03/21/13 1104     Chief Complaint  Patient presents with  . Weakness  . Leg Pain    Right     (Consider location/radiation/quality/duration/timing/severity/associated sxs/prior Treatment) HPI  This is an 80 yo male with history of dementia, hypertension, hyperlipidemia, coronary artery disease, atrial fibrillation and CVA who presents with generalized weakness and confusion. The patient presents with his family. History is provided by both patient and family members. Per the patient, he is "not feeling right." He reports nausea without vomiting. He denies any fevers. He is complaining of right leg pain. He denies any chest pain or shortness of breath but does state that sometimes he does get chest pain. The patient was with his son. Per the patient's son, he noted that his father was having hallucinations on Friday morning. He states that normally he will get more confused in the afternoon but this is very atypical for him. The father has been noted over the last 2-3 days to be "looking for his dead wife Junious Dresser." The son also found his father on the floor early Saturday morning. It was 5 AM and the patient had gotten up gotten dressed, made his bed and stated that he needed to go to bathroom; however, he tried to go to the closet use the bathroom.  The family is concerned about the hallucinations and increasing confusion. They've not noted any weakness, facial droop, or difficulty speaking.  Past Medical History  Diagnosis Date  . Anxiety   . Hypertension   . Hyperlipidemia   . CAD (coronary artery disease)   . Dementia   . Gout, unspecified 2012    crystal analysis at urgent care  . AF (atrial fibrillation)   . 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 Cancer 02/25/11    Squamous Cell Carcinoma- Left Dorsum Hand  . Type II or unspecified type diabetes  mellitus without mention of complication, not stated as uncontrolled 06/18/2006  . Stroke 07/13/11    Acute left parietal subcortical and periventricular white matter infarction.   Past Surgical History  Procedure Laterality Date  . Cholecystectomy    . Endarterectomy  1996    Bilateral (2 weeks apart)  . Skin cancer removal  06/2007    (? basal cell)  . 4 vessel cabg  06/97  . Mri brain  02/23/10    No acute abnormality Chronic Cerebral Ischemia Stable Ventr Prominence due to Volume loss  . Pseudoaneurysm repair  08/26/10    Right carotid endarterectomy pseudoaneurysm  . Peg placement    . Carotid endarterectomy  03/11/02 and July 2013   Family History  Problem Relation Age of Onset  . Colon cancer Sister   . Hypertension Brother   . Cancer Brother     intestine  . Brain cancer Brother   . Melanoma Sister   . Coronary artery disease Son     23, heart attack   History  Substance Use Topics  . Smoking status: Former Smoker    Types: Cigarettes  . Smokeless tobacco: Never Used     Comment: Remote mild smoking history  . Alcohol Use: No    Review of Systems  Constitutional: Negative for fever.  Respiratory: Negative.  Negative for chest tightness and shortness of breath.   Cardiovascular: Negative.  Negative for chest pain.  Gastrointestinal: Negative.  Negative  for abdominal pain.  Genitourinary: Negative.  Negative for dysuria.  Musculoskeletal: Negative for back pain.       Right leg pain  Skin: Negative for rash.  Neurological: Positive for weakness. Negative for dizziness, numbness and headaches.  Psychiatric/Behavioral: Positive for hallucinations and confusion.  All other systems reviewed and are negative.      Allergies  Lorazepam; Naproxen sodium; Niacin; Ramipril; Sulfonamide derivatives; and Zolpidem tartrate  Home Medications   Current Outpatient Rx  Name  Route  Sig  Dispense  Refill  . allopurinol (ZYLOPRIM) 100 MG tablet   Oral   Take 1 tablet  (100 mg total) by mouth daily.   90 tablet   3   . ALPRAZolam (XANAX) 0.25 MG tablet      take 0.5-1 tablet by mouth twice a day as needed for anxiety   60 tablet   2   . furosemide (LASIX) 40 MG tablet   Oral   Take 1 tablet (40 mg total) by mouth daily.   30 tablet   12   . metoprolol (LOPRESSOR) 50 MG tablet   Oral   Take 1.5 tablets (75 mg total) by mouth 2 (two) times daily.   90 tablet   12   . NAMENDA 10 MG tablet      take 1 tablet by mouth twice a day   60 tablet   11   . pantoprazole (PROTONIX) 40 MG tablet      take 1 tablet by mouth twice a day   60 tablet   12   . Rivaroxaban (XARELTO) 15 MG TABS tablet   Oral   Take 1 tablet (15 mg total) by mouth daily.   30 tablet   12   . rosuvastatin (CRESTOR) 20 MG tablet      take 1 tablet by mouth once daily   30 tablet   12   . sertraline (ZOLOFT) 50 MG tablet      take 1 and 1/2 tablet by mouth once daily   45 tablet   12    BP 107/73  Pulse 66  Temp(Src) 97.7 F (36.5 C) (Oral)  Resp 22  Ht 5\' 3"  (1.6 m)  Wt 182 lb (82.555 kg)  BMI 32.25 kg/m2  SpO2 93% Physical Exam  Nursing note and vitals reviewed. Constitutional: He is oriented to person, place, and time. No distress.  Elderly  HENT:  Head: Normocephalic and atraumatic.  Eyes: Pupils are equal, round, and reactive to light.  Neck: Neck supple. No JVD present.  Cardiovascular: Normal rate, regular rhythm and normal heart sounds.   No murmur heard. Pulmonary/Chest: Effort normal and breath sounds normal. No respiratory distress. He has no wheezes.  Abdominal: Soft. Bowel sounds are normal. There is no tenderness. There is no rebound.  Musculoskeletal: He exhibits no edema.  Lymphadenopathy:    He has no cervical adenopathy.  Neurological: He is alert and oriented to person, place, and time.  5 out of 5 strength in all 4 extremities, coordination intact to finger-nose-finger, patient is alert and oriented with fluent speech  Skin:  Skin is warm and dry.  Psychiatric: He has a normal mood and affect.    ED Course  Procedures (including critical care time) Labs Review Labs Reviewed  CBC WITH DIFFERENTIAL - Abnormal; Notable for the following:    RBC 4.16 (*)    HCT 38.5 (*)    All other components within normal limits  COMPREHENSIVE METABOLIC PANEL - Abnormal; Notable  for the following:    Glucose, Bld 113 (*)    Creatinine, Ser 1.42 (*)    GFR calc non Af Amer 43 (*)    GFR calc Af Amer 50 (*)    All other components within normal limits  URINALYSIS, ROUTINE W REFLEX MICROSCOPIC - Abnormal; Notable for the following:    Bilirubin Urine SMALL (*)    All other components within normal limits  GLUCOSE, CAPILLARY - Abnormal; Notable for the following:    Glucose-Capillary 102 (*)    All other components within normal limits  POCT I-STAT TROPONIN I   Imaging Review Dg Chest 2 View  03/21/2013   CLINICAL DATA:  Weakness and leg pain.  EXAM: CHEST  2 VIEW  COMPARISON:  Chest x-ray 04/17/2011.  FINDINGS: Lung volumes are normal. No consolidative airspace disease. No pleural effusions. No pneumothorax. 8 mm dense nodule in the right mid lung unchanged over numerous prior examinations, compatible with a small calcified granuloma. No other suspicious appearing pulmonary nodule or mass noted. Pulmonary vasculature and the cardiomediastinal silhouette are within normal limits. Atherosclerosis in the thoracic aorta. Status post median sternotomy for CABG including LIMA. Surgical clips in the lower cervical region and right upper quadrant of the abdomen.  IMPRESSION: 1.  No radiographic evidence of acute cardiopulmonary disease. 2. Atherosclerosis. 3. Postoperative changes, as above.   Electronically Signed   By: Vinnie Langton M.D.   On: 03/21/2013 13:04   Dg Pelvis 1-2 Views  03/21/2013   CLINICAL DATA:  Weakness in the right leg.  EXAM: PELVIS - 1-2 VIEW  COMPARISON:  No priors.  FINDINGS: AP view of the pelvis demonstrates  no acute displaced fracture of the bony pelvic ring. Bilateral proximal femurs as visualized appear intact, the femoral heads appear located on this single view examination. Atherosclerotic calcifications throughout the visualized vasculature.  IMPRESSION: No acute radiographic abnormality of the bony pelvis.   Electronically Signed   By: Vinnie Langton M.D.   On: 03/21/2013 13:05   Dg Femur Right  03/21/2013   CLINICAL DATA:  Weakness in the right leg.  EXAM: RIGHT FEMUR - 2 VIEW  COMPARISON:  No priors.  FINDINGS: AP and lateral views of the right femur demonstrate no evidence of acute displaced fracture. Mild degenerative changes of osteoarthritis are noted in the hip joint, but moderate to severe degenerative changes of osteoarthritis are noted in the knee joint, most pronounced in the lateral compartment. Atherosclerotic calcifications are noted throughout the visualized leg. Surgical clips are noted medial to the right knee.  IMPRESSION: 1. No acute radiographic abnormality of the right femur. 2. Degenerative changes of osteoarthritis in the right hip and knee joints, as above. 3. Extensive atherosclerosis.   Electronically Signed   By: Vinnie Langton M.D.   On: 03/21/2013 13:06   Ct Head Wo Contrast  03/21/2013   CLINICAL DATA:  Weakness and right leg pain.  EXAM: CT HEAD WITHOUT CONTRAST  TECHNIQUE: Contiguous axial images were obtained from the base of the skull through the vertex without intravenous contrast.  COMPARISON:  07/13/2011  FINDINGS: Ventricles, cisterns and other CSF spaces are within normal relative to patient's age. There is minimal atrophy and very minimal chronic ischemic microvascular disease. There is a small old lacunar infarct of the left caudate nucleus. There is no mass, mass effect, shift of midline structures or acute hemorrhage. There is no acute infarction. Remaining bones and soft tissues are within normal.  IMPRESSION: No acute intracranial findings.  Minimal atrophy  and chronic ischemic microvascular disease. Small old left caudate lacunar infarct.   Electronically Signed   By: Marin Olp M.D.   On: 03/21/2013 12:59    EKG Interpretation    Date/Time:  Sunday March 21 2013 10:50:17 EST Ventricular Rate:  68 PR Interval:    QRS Duration: 90 QT Interval:  440 QTC Calculation: 467 R Axis:   114 Text Interpretation:  Normal sinus rhythm Left posterior fascicular block Cannot rule out Anteroseptal infarct , age undetermined Abnormal ECG No significant change since last tracing Confirmed by HORTON  MD, Loma Sousa ([id number]) on 03/21/2013 11:12:27 AM            MDM   Final diagnoses:  Confusion  Arthritis   Patient presents with increasing confusion and hallucinations over the last 2-3 days. Neurologic exam is within normal limits. Patient is alert and oriented x3. Otherwise his exam is benign. Full workup was initiated including screening for infectious etiologies. Lab work is unremarkable and there is no evidence of UTI. CT scan of the head shows a remote infarct but no intracranial bleed or mass.. Given duration of symptoms and nonfocal neurologic exam, have low suspicion at this time for acute stroke as the etiology of the symptoms.  Patient does have a history of dementia and is being the natural progression of that. I had a long discussion with the family regarding my workup and suspicious. I encouraged to followup with his primary care provider tomorrow for repeat examination. I also encouraged the family to request a home health aide given that the patient has begun to show signs of disorientation. Patient's family stated understanding. They were given a copy of his workup.  After history, exam, and medical workup I feel the patient has been appropriately medically screened and is safe for discharge home. Pertinent diagnoses were discussed with the patient. Patient was given return precautions.     Merryl Hacker, MD 03/21/13 [phone]

## 2013-03-21 NOTE — Discharge Instructions (Signed)
Dementia Dementia is a general term for problems with brain function. A person with dementia has memory loss and a hard time with at least one other brain function such as thinking, speaking, or problem solving. Dementia can affect social functioning, how you do your job, your mood, or your personality. The changes may be hidden for a long time. The earliest forms of this disease are usually not detected by family or friends. Dementia can be:  Irreversible.  Potentially reversible.  Partially reversible.  Progressive. This means it can get worse over time. CAUSES  Irreversible dementia causes may include:  Degeneration of brain cells (Alzheimer's disease or lewy body dementia).  Multiple small strokes (vascular dementia).  Infection (chronic meningitis or Creutzfelt-Jakob disease).  Frontotemporal dementia. This affects younger people, age 63 to 69, compared to those who have Alzheimer's disease.  Dementia associated with other disorders like Parkinson's disease, Huntington's disease, or HIV-associated dementia. Potentially or partially reversible dementia causes may include:  Medicines.  Metabolic causes such as excessive alcohol intake, vitamin B12 deficiency, or thyroid disease.  Masses or pressure in the brain such as a tumor, blood clot, or hydrocephalus. SYMPTOMS  Symptoms are often hard to detect. Family members or coworkers may not notice them early in the disease process. Different people with dementia may have different symptoms. Symptoms can include:  A hard time with memory, especially recent memory. Long-term memory may not be impaired.  Asking the same question multiple times or forgetting something someone just said.  A hard time speaking your thoughts or finding certain words.  A hard time solving problems or performing familiar tasks (such as how to use a telephone).  Sudden changes in mood.  Changes in personality, especially increasing moodiness or  mistrust.  Depression.  A hard time understanding complex ideas that were never a problem in the past. DIAGNOSIS  There are no specific tests for dementia.   Your caregiver may recommend a thorough evaluation. This is because some forms of dementia can be reversible. The evaluation will likely include a physical exam and getting a detailed history from you and a family member. The history often gives the best clues and suggestions for a diagnosis.  Memory testing may be done. A detailed brain function evaluation called neuropsychologic testing may be helpful.  Lab tests and brain imaging (such as a CT scan or MRI scan) are sometimes important.  Sometimes observation and re-evaluation over time is very helpful. TREATMENT  Treatment depends on the cause.   If the problem is a vitamin deficiency, it may be helped or cured with supplements.  For dementias such as Alzheimer's disease, medicines are available to stabilize or slow the course of the disease. There are no cures for this type of dementia.  Your caregiver can help direct you to groups, organizations, and other caregivers to help with decisions in the care of you or your loved one. HOME CARE INSTRUCTIONS The care of individuals with dementia is varied and dependent upon the progression of the dementia. The following suggestions are intended for the person living with, or caring for, the person with dementia.  Create a safe environment.  Remove the locks on bathroom doors to prevent the person from accidentally locking himself or herself in.  Use childproof latches on kitchen cabinets and any place where cleaning supplies, chemicals, or alcohol are kept.  Use childproof covers in unused electrical outlets.  Install childproof devices to keep doors and windows secured.  Remove stove knobs or install safety  knobs and an automatic shut-off on the stove.  Lower the temperature on water heaters.  Label medicines and keep them  locked up.  Secure knives, lighters, matches, power tools, and guns, and keep these items out of reach.  Keep the house free from clutter. Remove rugs or anything that might contribute to a fall.  Remove objects that might break and hurt the person.  Make sure lighting is good, both inside and outside.  Install grab rails as needed.  Use a monitoring device to alert you to falls or other needs for help.  Reduce confusion.  Keep familiar objects and people around.  Use night lights or dim lights at night.  Label items or areas.  Use reminders, notes, or directions for daily activities or tasks.  Keep a simple, consistent routine for waking, meals, bathing, dressing, and bedtime.  Create a calm, quiet environment.  Place large clocks and calendars prominently.  Display emergency numbers and home address near all telephones.  Use cues to establish different times of the day. An example is to open curtains to let the natural light in during the day.   Use effective communication.  Choose simple words and short sentences.  Use a gentle, calm tone of voice.  Be careful not to interrupt.  If the person is struggling to find a word or communicate a thought, try to provide the word or thought.  Ask one question at a time. Allow the person ample time to answer questions. Repeat the question again if the person does not respond.  Reduce nighttime restlessness.  Provide a comfortable bed.  Have a consistent nighttime routine.  Ensure a regular walking or physical activity schedule. Involve the person in daily activities as much as possible.  Limit napping during the day.  Limit caffeine.  Attend social events that stimulate rather than overwhelm the senses.  Encourage good nutrition and hydration.  Reduce distractions during meal times and snacks.  Avoid foods that are too hot or too cold.  Monitor chewing and swallowing ability.  Continue with routine vision,  hearing, dental, and medical screenings.  Only give over-the-counter or prescription medicines as directed by the caregiver.  Monitor driving abilities. Do not allow the person to drive when safe driving is no longer possible.  Register with an identification program which could provide location assistance in the event of a missing person situation. SEEK MEDICAL CARE IF:   New behavioral problems start such as moodiness, aggressiveness, or seeing things that are not there (hallucinations).  Any new problem with brain function happens. This includes problems with balance, speech, or falling a lot.  Problems with swallowing develop.  Any symptoms of other illness happen. Small changes or worsening in any aspect of brain function can be a sign that the illness is getting worse. It can also be a sign of another medical illness such as infection. Seeing a caregiver right away is important. SEEK IMMEDIATE MEDICAL CARE IF:   A fever develops.  New or worsened confusion develops.  New or worsened sleepiness develops.  Staying awake becomes hard to do. Document Released: 07/17/2000 Document Revised: 04/15/2011 Document Reviewed: 06/18/2010 [facility] Patient Information 2014 [place], [place]. Arthritis, Nonspecific Arthritis is inflammation of a joint. This usually means pain, redness, warmth or swelling are present. One or more joints may be involved. There are a number of types of arthritis. Your caregiver may not be able to tell what type of arthritis you have right away. CAUSES  The most common  cause of arthritis is the wear and tear on the joint (osteoarthritis). This causes damage to the cartilage, which can break down over time. The knees, hips, back and neck are most often affected by this type of arthritis. Other types of arthritis and common causes of joint pain include:  Sprains and other injuries near the joint. Sometimes minor sprains and injuries cause pain and swelling that  develop hours later.  Rheumatoid arthritis. This affects hands, feet and knees. It usually affects both sides of your body at the same time. It is often associated with chronic ailments, fever, weight loss and general weakness.  Crystal arthritis. Gout and pseudo gout can cause occasional acute severe pain, redness and swelling in the foot, ankle, or knee.  Infectious arthritis. Bacteria can get into a joint through a break in overlying skin. This can cause infection of the joint. Bacteria and viruses can also spread through the blood and affect your joints.  Drug, infectious and allergy reactions. Sometimes joints can become mildly painful and slightly swollen with these types of illnesses. SYMPTOMS   Pain is the main symptom.  Your joint or joints can also be red, swollen and warm or hot to the touch.  You may have a fever with certain types of arthritis, or even feel overall ill.  The joint with arthritis will hurt with movement. Stiffness is present with some types of arthritis. DIAGNOSIS  Your caregiver will suspect arthritis based on your description of your symptoms and on your exam. Testing may be needed to find the type of arthritis:  Blood and sometimes urine tests.  X-ray tests and sometimes CT or MRI scans.  Removal of fluid from the joint (arthrocentesis) is done to check for bacteria, crystals or other causes. Your caregiver (or a specialist) will numb the area over the joint with a local anesthetic, and use a needle to remove joint fluid for examination. This procedure is only minimally uncomfortable.  Even with these tests, your caregiver may not be able to tell what kind of arthritis you have. Consultation with a specialist (rheumatologist) may be helpful. TREATMENT  Your caregiver will discuss with you treatment specific to your type of arthritis. If the specific type cannot be determined, then the following general recommendations may apply. Treatment of severe joint  pain includes:  Rest.  Elevation.  Only take over-the-counter or prescription medicines for pain and discomfort as recommended by your caregiver.  Cold packs over an inflamed joint may be used for 10 to 15 minutes every hour. Hot packs sometimes feel better, but do not use overnight. Do not use hot packs if you are diabetic without your caregiver's permission.  A cortisone shot into arthritic joints may help reduce pain and swelling.  Any acute arthritis that gets worse over the next 1 to 2 days needs to be looked at to be sure there is no joint infection. Long-term arthritis treatment involves modifying activities and lifestyle to reduce joint stress jarring. This can include weight loss. Also, exercise is needed to nourish the joint cartilage and remove waste. This helps keep the muscles around the joint strong. HOME CARE INSTRUCTIONS   Do not take aspirin to relieve pain if gout is suspected. This elevates uric acid levels.  Only take over-the-counter or prescription medicines for pain, discomfort or fever as directed by your caregiver.  Rest the joint as much as possible.  If your joint is swollen, keep it elevated.  Use crutches if the painful joint is in  your leg.  Drinking plenty of fluids may help for certain types of arthritis.  Follow your caregiver's dietary instructions.  Try low-impact exercise such as:  Swimming.  Water aerobics.  Biking.  Walking.  Morning stiffness is often relieved by a warm shower.  Put your joints through regular range-of-motion. SEEK MEDICAL CARE IF:   You do not feel better in 24 hours or are getting worse.  You have side effects to medications, or are not getting better with treatment. SEEK IMMEDIATE MEDICAL CARE IF:   You have a fever.  You develop severe joint pain, swelling or redness.  Many joints are involved and become painful and swollen.  There is severe back pain and/or leg weakness.  You have loss of bowel or  bladder control. Document Released: 02/29/2004 Document Revised: 04/15/2011 Document Reviewed: 03/16/2008 [facility] Patient Information 2014 [place].

## 2013-03-21 NOTE — ED Notes (Signed)
Patient discharged to home with family. NAD.  

## 2013-03-21 NOTE — ED Notes (Signed)
Pt c/o general weakness ongoing for some time. Pt also c/o right leg pain onset yesterday, denies injury. Equal grips B/L, no facial droop, speech clear, no arm drift.

## 2013-03-22 ENCOUNTER — Telehealth: Payer: Self-pay | Admitting: Family Medicine

## 2013-03-22 NOTE — Telephone Encounter (Signed)
[facility] Triage Call Report Triage Record Num: [id number] Operator: Jeanett Schlein Patient Name: Rudy Luhmann Call Date & Time: 03/21/2013 10:15:11AM Patient Phone: [phone] PCP: [name] Patient Gender: Male PCP Fax : Patient DOB: [birth year] Practice Name: [facility] Reason for Call: Caller: Gary/Other; PCP: [name] Brigitte Pulse) [facility]); CB#: [phone]; Call regarding worsening confusion. Has been walking around the house looking for his wife and she has been dead since 89. He doesn't recognize his son. Dominica Severin is concerned that he has had a stroke. Wants him evaluated but unsure if he should call 911 or take in PMV? Triaged per Confusion, needs to be seen in the ED. He is going to take him in the car. Protocol(s) Used: Confusion, Disorientation, Agitation Recommended Outcome per Protocol: See ED Immediately Reason for Outcome: New or worsening confusion, disorientation, or agitation Care Advice: ~ Another adult should drive. ~ IMMEDIATE ACTION 03/21/2013 10:23:39AM Page 1 of 1 [email or web address]

## 2013-03-22 NOTE — Telephone Encounter (Signed)
[name] pts son left v/m; pt already scheduled for 30 min appt on 03/25/13 at 4 pm. Pt was seen ED due to confusion on 03/21/13. [name] thinks pt needs 24/7 round the clock care; [name] thinks a sitter is needed more than a nurse. [name] said pt slept good last night with no episodes of confusion during the night.[name] said when he comes for appt on 03/25/13 would like any information Dr Damita Dunnings could give him on help in the home.[name] will cb if pt condition changes or worsens prior to appt. [name] does not need cb).

## 2013-03-22 NOTE — Telephone Encounter (Signed)
I saw the ER note.  Please see about getting patient/family a 3min visit later in the week.  Thanks.

## 2013-03-22 NOTE — Telephone Encounter (Signed)
Thanks

## 2013-03-23 ENCOUNTER — Other Ambulatory Visit: Payer: Self-pay | Admitting: Vascular Surgery

## 2013-03-23 DIAGNOSIS — I6529 Occlusion and stenosis of unspecified carotid artery: Secondary | ICD-10-CM

## 2013-03-23 DIAGNOSIS — Z48812 Encounter for surgical aftercare following surgery on the circulatory system: Secondary | ICD-10-CM

## 2013-03-24 NOTE — Telephone Encounter (Signed)
Spoke to patient's son Dominica Severin) and was advised that an appointment has already been scheduled for Thursday.

## 2013-03-25 ENCOUNTER — Ambulatory Visit (INDEPENDENT_AMBULATORY_CARE_PROVIDER_SITE_OTHER): Payer: Medicare Other | Admitting: Family Medicine

## 2013-03-25 ENCOUNTER — Encounter: Payer: Self-pay | Admitting: Family Medicine

## 2013-03-25 VITALS — BP 120/60 | HR 84 | Temp 98.2°F | Wt 180.2 lb

## 2013-03-25 DIAGNOSIS — F068 Other specified mental disorders due to known physiological condition: Secondary | ICD-10-CM

## 2013-03-25 NOTE — Assessment & Plan Note (Signed)
Progressive.  Wants to stay at home.  Likely progression d/w pt and family.  Supervision at home would be ideal, to try to limit meds for disorientation and to follow pt's wishes.  Family supportive.  Needs 24/7 supervision. Will ask about HH eval and phone number given to family for elder services. I'll check on the former and family to check on the later.  No change in meds currently, though this may be needed.  I thanked family for their effort.  >25 minutes spent in face to face time with patient, >50% spent in counselling or coordination of care.

## 2013-03-25 NOTE — Patient Instructions (Signed)
Let me check on some options in the meantime and we'll be in touch.  24/7 supervision.  Take care.

## 2013-03-25 NOTE — Progress Notes (Signed)
Pre visit review using our clinic review tool, if applicable. No additional management support is needed unless otherwise documented below in the visit note.  ER notes reviewed.  Progressive short term memory loss.  Now with occ hallucination, worse at night, sees dead relatives.  Has fallen at home.  Not combative.  Easily confused, thought the closet was the bathroom. Still able to get out of bed and walk to table for meals, can feed self.  Can't fix a meal.  Possible wandering- needs supervision.  Sleeping more.  Continent.  No recent med changes.  ER w/u neg for acute reversible changes. Family here with patient today.   PMH and SH reviewed  ROS: See HPI, otherwise noncontributory.  Meds, vitals, and allergies reviewed.   GEN: nad, alert and but not oriented, doesn't remember the ER visit.  Distant memory intact HEENT: mucous membranes moist NECK: supple w/o LA CV: rrr.  PULM: ctab, no inc wob ABD: soft, +bs EXT: no edema SKIN: no acute rash CN 2-12 wnl B, S/S/DTR wnl x4

## 2013-04-06 ENCOUNTER — Telehealth: Payer: Self-pay | Admitting: Family Medicine

## 2013-04-06 NOTE — Telephone Encounter (Signed)
Noted, thanks!

## 2013-04-06 NOTE — Telephone Encounter (Signed)
Spoke with Dominica Severin , patients son and he says that they really are not wanting Home Health referral at this time. They are working out family to be with their dad and say that he really wouldn't be able to do the therapy at this time. I will cancel the referral.

## 2013-05-31 ENCOUNTER — Ambulatory Visit (INDEPENDENT_AMBULATORY_CARE_PROVIDER_SITE_OTHER): Payer: Medicare Other | Admitting: Podiatry

## 2013-05-31 ENCOUNTER — Encounter: Payer: Self-pay | Admitting: Podiatry

## 2013-05-31 VITALS — BP 165/90 | HR 63 | Resp 16

## 2013-05-31 DIAGNOSIS — B351 Tinea unguium: Secondary | ICD-10-CM

## 2013-05-31 DIAGNOSIS — M79609 Pain in unspecified limb: Secondary | ICD-10-CM

## 2013-05-31 NOTE — Patient Instructions (Signed)
Diabetes and Foot Care Diabetes may cause you to have problems because of poor blood supply (circulation) to your feet and legs. This may cause the skin on your feet to become thinner, break easier, and heal more slowly. Your skin may become dry, and the skin may peel and crack. You may also have nerve damage in your legs and feet causing decreased feeling in them. You may not notice minor injuries to your feet that could lead to infections or more serious problems. Taking care of your feet is one of the most important things you can do for yourself.  HOME CARE INSTRUCTIONS  Wear shoes at all times, even in the house. Do not go barefoot. Bare feet are easily injured.  Check your feet daily for blisters, cuts, and redness. If you cannot see the bottom of your feet, use a mirror or ask someone for help.  Wash your feet with warm water (do not use hot water) and mild soap. Then pat your feet and the areas between your toes until they are completely dry. Do not soak your feet as this can dry your skin.  Apply a moisturizing lotion or petroleum jelly (that does not contain alcohol and is unscented) to the skin on your feet and to dry, brittle toenails. Do not apply lotion between your toes.  Trim your toenails straight across. Do not dig under them or around the cuticle. File the edges of your nails with an emery board or nail file.  Do not cut corns or calluses or try to remove them with medicine.  Wear clean socks or stockings every day. Make sure they are not too tight. Do not wear knee-high stockings since they may decrease blood flow to your legs.  Wear shoes that fit properly and have enough cushioning. To break in new shoes, wear them for just a few hours a day. This prevents you from injuring your feet. Always look in your shoes before you put them on to be sure there are no objects inside.  Do not cross your legs. This may decrease the blood flow to your feet.  If you find a minor scrape,  cut, or break in the skin on your feet, keep it and the skin around it clean and dry. These areas may be cleansed with mild soap and water. Do not cleanse the area with peroxide, alcohol, or iodine.  When you remove an adhesive bandage, be sure not to damage the skin around it.  If you have a wound, look at it several times a day to make sure it is healing.  Do not use heating pads or hot water bottles. They may burn your skin. If you have lost feeling in your feet or legs, you may not know it is happening until it is too late.  Make sure your health care provider performs a complete foot exam at least annually or more often if you have foot problems. Report any cuts, sores, or bruises to your health care provider immediately. SEEK MEDICAL CARE IF:   You have an injury that is not healing.  You have cuts or breaks in the skin.  You have an ingrown nail.  You notice redness on your legs or feet.  You feel burning or tingling in your legs or feet.  You have pain or cramps in your legs and feet.  Your legs or feet are numb.  Your feet always feel cold. SEEK IMMEDIATE MEDICAL CARE IF:   There is increasing redness,   swelling, or pain in or around a wound.  There is a red line that goes up your leg.  Pus is coming from a wound.  You develop a fever or as directed by your health care provider.  You notice a bad smell coming from an ulcer or wound. Document Released: 01/19/2000 Document Revised: 09/23/2012 Document Reviewed: 06/30/2012 [employer] Patient Information 2014 [employer].  

## 2013-06-02 NOTE — Progress Notes (Signed)
Subjective:     Patient ID: [name], male   DOB: 1924/04/11, 80 y.o.   MRN: [id number]  HPI patient presents with a long gaited thick and nailbeds 1-5 both feet that he cannot cut and are painful in shoe gear   Review of Systems     Objective:   Physical Exam Neurovascular status intact with thick mycotic nailbeds 1-5 both feet that are painful when pressed and brittle    Assessment:     Chronic mycotic nail infection 1-5 both feet    Plan:     Debridement painful nailbeds 1-5 both feet with no iatrogenic bleeding noted

## 2013-06-21 ENCOUNTER — Telehealth: Payer: Self-pay | Admitting: Family Medicine

## 2013-06-21 NOTE — Telephone Encounter (Signed)
Pt's son called an states that [name] told him that [name] is not primary care doctor and that medication had been denied refill because ALPRAZolam (XANAX) 0.25 MG tablet [[id number]] Order Details cannot be prescribed if we are not primary care doctor.  Told [name] that this is not the case and we had not notified [name].  Requesting a refill on medication to [name].  Best number to reach [name] is [phone] and he would like a call when completed.

## 2013-06-22 ENCOUNTER — Other Ambulatory Visit: Payer: Self-pay | Admitting: Family Medicine

## 2013-06-22 MED ORDER — ALPRAZOLAM 0.25 MG PO TABS: ORAL_TABLET | ORAL | Status: DC

## 2013-06-22 NOTE — Telephone Encounter (Signed)
Left detailed message on voicemail.  

## 2013-06-22 NOTE — Telephone Encounter (Signed)
I think you handled this correctly.  I agree with you.  I don't see anything else to do.  Thanks.

## 2013-06-22 NOTE — Telephone Encounter (Signed)
This [name] just gave me an "ear full" of his frustration.  He says he phoned in for a refill last Wednesday but was never told that the medication had not been filled.  He seems to think that the problem is with [place] and I assured him that we don't have a request and the first we heard of it was yesterday.  I believe that to be the case, if you see differently, please let me know and I will certainly apologize to this man but otherwise, I do not care to speak with him about this matter again.    I spoke with Len at [facility] and he admitted that the refill had been sent to the [facility] office.  However, apparently this had not been explored previously and the patient was led to believe that the problem was with [place].  I asked the pharmacist to please change our contact information immediately and to explain the situation to [name].  Medication phoned to pharmacy.

## 2013-06-22 NOTE — Telephone Encounter (Signed)
Please call in.  Thanks.   

## 2013-07-14 ENCOUNTER — Other Ambulatory Visit: Payer: Self-pay | Admitting: Family Medicine

## 2013-07-19 ENCOUNTER — Ambulatory Visit (INDEPENDENT_AMBULATORY_CARE_PROVIDER_SITE_OTHER): Payer: Medicare Other | Admitting: Family Medicine

## 2013-07-19 ENCOUNTER — Encounter: Payer: Self-pay | Admitting: Family Medicine

## 2013-07-19 VITALS — BP 132/60 | HR 73 | Temp 98.1°F | Wt 181.0 lb

## 2013-07-19 DIAGNOSIS — F068 Other specified mental disorders due to known physiological condition: Secondary | ICD-10-CM

## 2013-07-19 DIAGNOSIS — E119 Type 2 diabetes mellitus without complications: Secondary | ICD-10-CM

## 2013-07-19 LAB — HEMOGLOBIN A1C: Hgb A1c MFr Bld: 6.6 % — ABNORMAL HIGH (ref 4.6–6.5)

## 2013-07-19 NOTE — Assessment & Plan Note (Signed)
Has supervision at home, mood is stable.  No change in meds.  Recheck in about 6 months.  Still needs 24/7 supervision, d/w pt's family.

## 2013-07-19 NOTE — Progress Notes (Signed)
Pre visit review using our clinic review tool, if applicable. No additional management support is needed unless otherwise documented below in the visit note.  See scanned form for HPI through exam.

## 2013-07-19 NOTE — Assessment & Plan Note (Signed)
Low carb diet, see notes on labs.  Recheck likely in about 6 months.

## 2013-07-19 NOTE — Patient Instructions (Signed)
Go to the lab on the way out.  We'll contact you with your lab report. Recheck at a physical in about 6 months.  Return sooner if needed.   Glad to see you.

## 2013-07-20 ENCOUNTER — Other Ambulatory Visit: Payer: Self-pay | Admitting: Family Medicine

## 2013-07-23 ENCOUNTER — Other Ambulatory Visit: Payer: Self-pay | Admitting: Family Medicine

## 2013-07-26 NOTE — Telephone Encounter (Signed)
Received refill request electronically. See allergy/contraindication. Is it okay to refill medication? 

## 2013-07-27 NOTE — Telephone Encounter (Signed)
Sent. Thanks.   

## 2013-08-02 ENCOUNTER — Other Ambulatory Visit: Payer: Self-pay | Admitting: Family Medicine

## 2013-08-04 ENCOUNTER — Ambulatory Visit (INDEPENDENT_AMBULATORY_CARE_PROVIDER_SITE_OTHER): Payer: Medicare Other | Admitting: Podiatry

## 2013-08-04 ENCOUNTER — Encounter: Payer: Self-pay | Admitting: Podiatry

## 2013-08-04 DIAGNOSIS — M79609 Pain in unspecified limb: Secondary | ICD-10-CM

## 2013-08-04 DIAGNOSIS — B351 Tinea unguium: Secondary | ICD-10-CM

## 2013-08-04 DIAGNOSIS — M79673 Pain in unspecified foot: Secondary | ICD-10-CM

## 2013-08-04 NOTE — Patient Instructions (Signed)
Diabetes and Foot Care Diabetes may cause you to have problems because of poor blood supply (circulation) to your feet and legs. This may cause the skin on your feet to become thinner, break easier, and heal more slowly. Your skin may become dry, and the skin may peel and crack. You may also have nerve damage in your legs and feet causing decreased feeling in them. You may not notice minor injuries to your feet that could lead to infections or more serious problems. Taking care of your feet is one of the most important things you can do for yourself.  HOME CARE INSTRUCTIONS  Wear shoes at all times, even in the house. Do not go barefoot. Bare feet are easily injured.  Check your feet daily for blisters, cuts, and redness. If you cannot see the bottom of your feet, use a mirror or ask someone for help.  Wash your feet with warm water (do not use hot water) and mild soap. Then pat your feet and the areas between your toes until they are completely dry. Do not soak your feet as this can dry your skin.  Apply a moisturizing lotion or petroleum jelly (that does not contain alcohol and is unscented) to the skin on your feet and to dry, brittle toenails. Do not apply lotion between your toes.  Trim your toenails straight across. Do not dig under them or around the cuticle. File the edges of your nails with an emery board or nail file.  Do not cut corns or calluses or try to remove them with medicine.  Wear clean socks or stockings every day. Make sure they are not too tight. Do not wear knee-high stockings since they may decrease blood flow to your legs.  Wear shoes that fit properly and have enough cushioning. To break in new shoes, wear them for just a few hours a day. This prevents you from injuring your feet. Always look in your shoes before you put them on to be sure there are no objects inside.  Do not cross your legs. This may decrease the blood flow to your feet.  If you find a minor scrape,  cut, or break in the skin on your feet, keep it and the skin around it clean and dry. These areas may be cleansed with mild soap and water. Do not cleanse the area with peroxide, alcohol, or iodine.  When you remove an adhesive bandage, be sure not to damage the skin around it.  If you have a wound, look at it several times a day to make sure it is healing.  Do not use heating pads or hot water bottles. They may burn your skin. If you have lost feeling in your feet or legs, you may not know it is happening until it is too late.  Make sure your health care provider performs a complete foot exam at least annually or more often if you have foot problems. Report any cuts, sores, or bruises to your health care provider immediately. SEEK MEDICAL CARE IF:   You have an injury that is not healing.  You have cuts or breaks in the skin.  You have an ingrown nail.  You notice redness on your legs or feet.  You feel burning or tingling in your legs or feet.  You have pain or cramps in your legs and feet.  Your legs or feet are numb.  Your feet always feel cold. SEEK IMMEDIATE MEDICAL CARE IF:   There is increasing redness,   swelling, or pain in or around a wound.  There is a red line that goes up your leg.  Pus is coming from a wound.  You develop a fever or as directed by your health care provider.  You notice a bad smell coming from an ulcer or wound. Document Released: 01/19/2000 Document Revised: 09/23/2012 Document Reviewed: 06/30/2012 [employer] Patient Information 2015 [employer]. This information is not intended to replace advice given to you by your health care provider. Make sure you discuss any questions you have with your health care provider.  

## 2013-08-05 ENCOUNTER — Ambulatory Visit: Payer: Medicare Other | Admitting: Podiatry

## 2013-08-05 NOTE — Progress Notes (Signed)
Subjective:     Patient ID: [name], male   DOB: Jul 19, 1924, 80 y.o.   MRN: [id number]  HPI patient presents with thick nailbeds and pain 1-5 both feet that he cannot cut   Review of Systems     Objective:   Physical Exam Neurovascular status intact with thick painful nailbeds 1-5 both feet that are painful    Assessment:     Mycotic nail infection is with pain 1-5 both feet    Plan:     Debris painful nailbeds 1-5 both feet with no iatrogenic bleeding noted

## 2013-08-08 ENCOUNTER — Other Ambulatory Visit: Payer: Self-pay | Admitting: Family Medicine

## 2013-08-09 NOTE — Telephone Encounter (Signed)
Last filled 07/09/12 with 12 refills--last OV was 07/19/13--please advise if okay to refill

## 2013-08-10 NOTE — Telephone Encounter (Signed)
Sent. Thanks.   

## 2013-08-18 ENCOUNTER — Other Ambulatory Visit: Payer: Self-pay | Admitting: Family Medicine

## 2013-08-19 NOTE — Telephone Encounter (Signed)
Sent. Thanks.   

## 2013-08-19 NOTE — Telephone Encounter (Signed)
Last filled 07/09/2012

## 2013-09-07 ENCOUNTER — Other Ambulatory Visit (HOSPITAL_COMMUNITY): Payer: Medicare Other

## 2013-09-07 ENCOUNTER — Ambulatory Visit: Payer: Medicare Other | Admitting: Family

## 2013-09-24 ENCOUNTER — Other Ambulatory Visit (HOSPITAL_COMMUNITY): Payer: Medicare Other

## 2013-09-24 ENCOUNTER — Ambulatory Visit: Payer: Medicare Other | Admitting: Family

## 2013-10-04 ENCOUNTER — Telehealth: Payer: Self-pay | Admitting: *Deleted

## 2013-10-04 NOTE — Telephone Encounter (Signed)
Patient's son left a voice mail stating that he got a refill on Namenda this weekend. Dominica Severin stated that they gave him a generic this time Memantine HCL 10 and wants to make sure that a generic for this medication is okay. Dominica Severin stated that patient is taking another medication with HCL on it and wants to make sure that is okay because he does not know what HCL means. Dominica Severin stated that he has never been given a generic before and wants to make sure this is okay. Please advise.

## 2013-10-04 NOTE — Telephone Encounter (Signed)
Patient's son notified.

## 2013-10-04 NOTE — Telephone Encounter (Signed)
plz notify ok to take generic memantine HCL.

## 2013-11-15 ENCOUNTER — Ambulatory Visit (INDEPENDENT_AMBULATORY_CARE_PROVIDER_SITE_OTHER): Payer: Medicare Other | Admitting: Podiatry

## 2013-11-15 DIAGNOSIS — B351 Tinea unguium: Secondary | ICD-10-CM

## 2013-11-15 DIAGNOSIS — M79673 Pain in unspecified foot: Secondary | ICD-10-CM

## 2013-11-15 NOTE — Patient Instructions (Signed)
Diabetes and Foot Care Diabetes may cause you to have problems because of poor blood supply (circulation) to your feet and legs. This may cause the skin on your feet to become thinner, break easier, and heal more slowly. Your skin may become dry, and the skin may peel and crack. You may also have nerve damage in your legs and feet causing decreased feeling in them. You may not notice minor injuries to your feet that could lead to infections or more serious problems. Taking care of your feet is one of the most important things you can do for yourself.  HOME CARE INSTRUCTIONS  Wear shoes at all times, even in the house. Do not go barefoot. Bare feet are easily injured.  Check your feet daily for blisters, cuts, and redness. If you cannot see the bottom of your feet, use a mirror or ask someone for help.  Wash your feet with warm water (do not use hot water) and mild soap. Then pat your feet and the areas between your toes until they are completely dry. Do not soak your feet as this can dry your skin.  Apply a moisturizing lotion or petroleum jelly (that does not contain alcohol and is unscented) to the skin on your feet and to dry, brittle toenails. Do not apply lotion between your toes.  Trim your toenails straight across. Do not dig under them or around the cuticle. File the edges of your nails with an emery board or nail file.  Do not cut corns or calluses or try to remove them with medicine.  Wear clean socks or stockings every day. Make sure they are not too tight. Do not wear knee-high stockings since they may decrease blood flow to your legs.  Wear shoes that fit properly and have enough cushioning. To break in new shoes, wear them for just a few hours a day. This prevents you from injuring your feet. Always look in your shoes before you put them on to be sure there are no objects inside.  Do not cross your legs. This may decrease the blood flow to your feet.  If you find a minor scrape,  cut, or break in the skin on your feet, keep it and the skin around it clean and dry. These areas may be cleansed with mild soap and water. Do not cleanse the area with peroxide, alcohol, or iodine.  When you remove an adhesive bandage, be sure not to damage the skin around it.  If you have a wound, look at it several times a day to make sure it is healing.  Do not use heating pads or hot water bottles. They may burn your skin. If you have lost feeling in your feet or legs, you may not know it is happening until it is too late.  Make sure your health care provider performs a complete foot exam at least annually or more often if you have foot problems. Report any cuts, sores, or bruises to your health care provider immediately. SEEK MEDICAL CARE IF:   You have an injury that is not healing.  You have cuts or breaks in the skin.  You have an ingrown nail.  You notice redness on your legs or feet.  You feel burning or tingling in your legs or feet.  You have pain or cramps in your legs and feet.  Your legs or feet are numb.  Your feet always feel cold. SEEK IMMEDIATE MEDICAL CARE IF:   There is increasing redness,   swelling, or pain in or around a wound.  There is a red line that goes up your leg.  Pus is coming from a wound.  You develop a fever or as directed by your health care provider.  You notice a bad smell coming from an ulcer or wound. Document Released: 01/19/2000 Document Revised: 09/23/2012 Document Reviewed: 06/30/2012 [employer] Patient Information 2015 [employer]. This information is not intended to replace advice given to you by your health care provider. Make sure you discuss any questions you have with your health care provider.  

## 2013-11-15 NOTE — Progress Notes (Signed)
   Subjective:    Patient ID: [name], male    DOB: 1925-01-14, 80 y.o.   MRN: [id number]  HPI Comments: Patient presents with thick nailbeds and pain 1-5 both feet that he cannot cut.  Review of Systems  Objective:   Physical Exam Neurovascular status intact with thick painful nailbeds 1-5 both feet that are painful.  Assessment:  Mycotic nail infection with pain 1-5 both feet.  Plan:  Debridement painful nailbeds 1-5 both feet with no iatrogenic bleeding noted.     Review of Systems     Objective:   Physical Exam        Assessment & Plan:

## 2013-11-19 ENCOUNTER — Ambulatory Visit (INDEPENDENT_AMBULATORY_CARE_PROVIDER_SITE_OTHER): Payer: Medicare Other

## 2013-11-19 DIAGNOSIS — Z23 Encounter for immunization: Secondary | ICD-10-CM

## 2013-11-26 ENCOUNTER — Encounter: Payer: Self-pay | Admitting: Family

## 2013-11-29 ENCOUNTER — Ambulatory Visit (INDEPENDENT_AMBULATORY_CARE_PROVIDER_SITE_OTHER): Payer: Medicare Other | Admitting: Family

## 2013-11-29 ENCOUNTER — Encounter: Payer: Self-pay | Admitting: Family

## 2013-11-29 ENCOUNTER — Ambulatory Visit (HOSPITAL_COMMUNITY)
Admission: RE | Admit: 2013-11-29 | Discharge: 2013-11-29 | Disposition: A | Discharge status: Home / Self Care | Payer: Medicare Other | Source: Ambulatory Visit | Attending: Family | Admitting: Family

## 2013-11-29 VITALS — BP 100/60 | HR 62 | Resp 16 | Ht 60.5 in | Wt 183.0 lb

## 2013-11-29 DIAGNOSIS — Z48812 Encounter for surgical aftercare following surgery on the circulatory system: Secondary | ICD-10-CM

## 2013-11-29 DIAGNOSIS — I6529 Occlusion and stenosis of unspecified carotid artery: Secondary | ICD-10-CM | POA: Insufficient documentation

## 2013-11-29 DIAGNOSIS — I6523 Occlusion and stenosis of bilateral carotid arteries: Secondary | ICD-10-CM

## 2013-11-29 NOTE — Progress Notes (Signed)
Established Carotid Patient   History of Present Illness  [name] is a 80 y.o. male patient of Dr. Kellie Simmering who is s/p right carotid endarterectomy 1986 with revision in 2004, left carotid endarterectomy 1986, right carotid pseudoaneurysm repair 08/31/2010 endarterectomy site with replacement using vein patch from left leg. He returns today for follow up. The patient has Alzheimer's Disease and his daughter is answering questions, states he has 24/7 care since 2014.  The patient's daughter denies any history of TIA or stroke symptoms for her father, specifically the patient's daughter denies patient history of amaurosis fugax or monocular blindness, denies a history unilateral  of facial drooping, denies a history of hemiplegia, and denies a history of receptive or expressive aphasia, but daughter states that an MRI of his head showed several TIA's.   Daughter denies that her father has any non healing wounds, he ambulates without difficulty.  The patient denies tingling, numbness, weakness, or pain in either hand or arm, denies dizziness with raising hands above head.  The patient's daughter denies that pt had any New Medical or Surgical History.  Pt Diabetic: Yes, diet controlled Pt smoker: former smoker, quit in 2005  Pt meds include: Statin : Yes ASA: No Other anticoagulants/antiplatelets: atrial fib, takes Jennye Moccasin   Past Medical History  Diagnosis Date  . Anxiety   . Hypertension   . Hyperlipidemia   . CAD (coronary artery disease)   . Dementia   . Gout, unspecified 2012    crystal analysis at urgent care  . AF (atrial fibrillation)   . 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 Cancer 02/25/11    Squamous Cell Carcinoma- Left Dorsum Hand  . Type II or unspecified type diabetes mellitus without mention of complication, not stated as uncontrolled 06/18/2006  . Stroke 07/13/11    Acute left parietal subcortical and periventricular white matter  infarction.  . Carotid artery occlusion     Social History History  Substance Use Topics  . Smoking status: Former Smoker    Types: Cigarettes  . Smokeless tobacco: Never Used     Comment: Remote mild smoking history  . Alcohol Use: No    Family History Family History  Problem Relation Age of Onset  . Colon cancer Sister   . Hypertension Brother   . Cancer Brother     intestine  . Brain cancer Brother   . Melanoma Sister   . Coronary artery disease Son     45, heart attack  . Diabetes Son   . Heart disease Son     After 84 yrs of age  . Hypertension Son   . Heart attack Son   . Other Mother     Intestinal  Blockage  . Other Father     Intestinal Blockage  . Diabetes Daughter   . Hypertension Daughter   . Hypertension Son     Surgical History Past Surgical History  Procedure Laterality Date  . Cholecystectomy    . Endarterectomy  1996    Bilateral (2 weeks apart)  . Skin cancer removal  06/2007    (? basal cell)  . 4 vessel cabg  06/97  . Mri brain  02/23/10    No acute abnormality Chronic Cerebral Ischemia Stable Ventr Prominence due to Volume loss  . Pseudoaneurysm repair  08/26/10    Right carotid endarterectomy pseudoaneurysm  . Peg placement    . Carotid endarterectomy  03/11/02 and July 2013  Allergies  Allergen Reactions  . Lorazepam [Lorazepam]   . Naproxen Sodium     REACTION: rash all over  . Niacin     REACTION: unspecified  . Ramipril     REACTION: syncope  . Sulfonamide Derivatives     REACTION: unspecified (no HCTZ allergy)  . Zolpidem Tartrate [Zolpidem Tartrate]     Current Outpatient Prescriptions  Medication Sig Dispense Refill  . allopurinol (ZYLOPRIM) 100 MG tablet take 1 tablet by mouth once daily  90 tablet  3  . ALPRAZolam (XANAX) 0.25 MG tablet take 0.5-1 tablet by mouth twice a day as needed for anxiety  60 tablet  2  . furosemide (LASIX) 40 MG tablet take 1 tablet by mouth daily  30 tablet  12  . metoprolol  (LOPRESSOR) 50 MG tablet TAKE 1 AND 1/2 TABLETS TWICE DAILY  90 tablet  12  . NAMENDA 10 MG tablet take 1 tablet by mouth twice a day  60 tablet  11  . pantoprazole (PROTONIX) 40 MG tablet take 1 tablet by mouth twice a day  60 tablet  12  . rosuvastatin (CRESTOR) 20 MG tablet Take 1 tablet (20 mg total) by mouth daily.  30 tablet  5  . sertraline (ZOLOFT) 50 MG tablet take 1 and 1/2 tablet by mouth daily  45 tablet  12  . XARELTO 15 MG TABS tablet take 1 tablet by mouth once daily  30 tablet  12   No current facility-administered medications for this visit.    Review of Systems : See HPI for pertinent positives and negatives.  Physical Examination  Filed Vitals:   11/29/13 1222 11/29/13 1232  BP: 101/62 100/60  Pulse: 62 62  Resp:  16  Height:  5' 0.5" (1.537 m)  Weight:  183 lb (83.008 kg)  SpO2:  94%   Body mass index is 35.14 kg/(m^2).  General: WDWN obese male in NAD GAIT: normal Eyes: PERRLA Pulmonary:  Non-labored, CTAB, Negative  Rales, Negative rhonchi, & Negative wheezing.  Cardiac: regular Rhythm,  Negative detected murmur.  VASCULAR EXAM Carotid Bruits Right Left   Negative Negative    Aorta is not palpable. Radial pulses: right is 2+ palpable, left is not palpable, left ulnar is not palpable, left brachial is faintly palpable.                                                                                                                            LE Pulses Right Left       POPLITEAL  not palpable   not palpable    Gastrointestinal: soft, nontender, BS WNL, no r/g,  negative palpated masses.  Musculoskeletal: Negative muscle atrophy/wasting. M/S 5/5 throughout, Extremities without ischemic changes.  Neurologic: A&O X 2; Appropriate Affect ; SENSATION ;normal;  Speech is normal, he follows commands appropriately, CN 2-12 intact except is hard of hearing, Motor exam as listed above.   Non-Invasive Vascular Imaging CAROTID DUPLEX 11/29/2013  CEREBROVASCULAR DUPLEX EVALUATION    INDICATION: Follow-up carotid disease     PREVIOUS INTERVENTION(S): Right carotid endarterectomy 1986 with revision in 2004.  Left carotid endarterectomy 1986. Right carotid pseudoaneurysm repair 08/31/2010    DUPLEX EXAM:     RIGHT  LEFT  Peak Systolic Velocities (cm/s) End Diastolic Velocities (cm/s) Plaque LOCATION Peak Systolic Velocities (cm/s) End Diastolic Velocities (cm/s) Plaque  106 10 HT CCA PROXIMAL 119 17   194 20 HT CCA MID 85 15   172 16 HT CCA DISTAL 108 19 HT  82 0  ECA 171 0 HT  48 10 HT ICA PROXIMAL 94 18 HT  55 11  ICA MID 94 20   57 12  ICA DISTAL 95 20     NA ICA / CCA Ratio (PSV) NA  Antegrade  Vertebral Flow Retrograde    Brachial Systolic Pressure (mmHg)   Within normal limits  Brachial Artery Waveforms Monophasic    Plaque Morphology:  HM = Homogeneous, HT = Heterogeneous, CP = Calcific Plaque, SP = Smooth Plaque, IP = Irregular Plaque     ADDITIONAL FINDINGS: Right subclavian artery velocity: 196cm/second Left subclavian artery velocity: 541cm/second     IMPRESSION: 1. Patent bilateral carotid endarterectomy with restenosis observed at the proximal patch sites; ~50% on the right and < 50% on the left. 2. Left external carotid artery stenosis is observed. 3. Right vertebral artery is antegrade. 4. Left vertebral artery is retrograde with left subclavian artery stenosis observed.    Compared to the previous exam:  Left subclavian artery velocities have increased compared to previous exam.     Assessment: [name] is a 80 y.o. male who is s/p right carotid endarterectomy 1986 with revision in 2004, left carotid endarterectomy 1986, right carotid pseudoaneurysm repair 08/31/2010 endarterectomy site with replacement using vein patch from left leg. He presents with asymptomatic patent bilateral carotid endarterectomy with restenosis observed at the proximal patch sites; ~50% on the right and < 50% on the  left. Right vertebral artery is antegrade. Left vertebral artery is retrograde with left subclavian artery stenosis observed.  325 cm/s in the left proximal subclavian artery on 09/03/12, today is 541 cm/sec. Today right subclavian artery velocity: 196cm/second. Left subclavian artery velocities have increased compared to previous exam. The patient denies tingling, numbness, weakness, or pain in either hand or arm, denies dizziness with raising hands above head, is asymptomatic of subclavian steal symptoms. Both hands are warm, pink appear well perfused.  Patient's daughter had questions re the Duplex results today, specifically the left subclavian stenosis; each question was addressed.   Plan: Based on today's Duplex, exam, and HPI, and after discussing with Dr. Trula Slade, follow-up in 1 year with Carotid Duplex scan.   I discussed in depth with the patient the nature of atherosclerosis, and emphasized the importance of maximal medical management including strict control of blood pressure, blood glucose, and lipid levels, obtaining regular exercise, and continued cessation of smoking.  The patient is aware that without maximal medical management the underlying atherosclerotic disease process will progress, limiting the benefit of any interventions. The patient was given information about stroke prevention and what symptoms should prompt the patient to seek immediate medical care. Thank you for allowing [place] to participate in this patient's care.  Clemon Chambers, RN, MSN, FNP-C [facility] Office: [phone]  Clinic Physician:  Trula Slade  11/29/2013 12:36 PM

## 2013-11-29 NOTE — Patient Instructions (Signed)
Stroke Prevention Some medical conditions and behaviors are associated with an increased chance of having a stroke. You may prevent a stroke by making healthy choices and managing medical conditions. HOW CAN I REDUCE MY RISK OF HAVING A STROKE?   Stay physically active. Get at least 30 minutes of activity on most or all days.  Do not smoke. It may also be helpful to avoid exposure to secondhand smoke.  Limit alcohol use. Moderate alcohol use is considered to be:  No more than 2 drinks per day for men.  No more than 1 drink per day for nonpregnant women.  Eat healthy foods. This involves:  Eating 5 or more servings of fruits and vegetables a day.  Making dietary changes that address high blood pressure (hypertension), high cholesterol, diabetes, or obesity.  Manage your cholesterol levels.  Making food choices that are high in fiber and low in saturated fat, trans fat, and cholesterol may control cholesterol levels.  Take any prescribed medicines to control cholesterol as directed by your health care provider.  Manage your diabetes.  Controlling your carbohydrate and sugar intake is recommended to manage diabetes.  Take any prescribed medicines to control diabetes as directed by your health care provider.  Control your hypertension.  Making food choices that are low in salt (sodium), saturated fat, trans fat, and cholesterol is recommended to manage hypertension.  Take any prescribed medicines to control hypertension as directed by your health care provider.  Maintain a healthy weight.  Reducing calorie intake and making food choices that are low in sodium, saturated fat, trans fat, and cholesterol are recommended to manage weight.  Stop drug abuse.  Avoid taking birth control pills.  Talk to your health care provider about the risks of taking birth control pills if you are over 35 years old, smoke, get migraines, or have ever had a blood clot.  Get evaluated for sleep  disorders (sleep apnea).  Talk to your health care provider about getting a sleep evaluation if you snore a lot or have excessive sleepiness.  Take medicines only as directed by your health care provider.  For some people, aspirin or blood thinners (anticoagulants) are helpful in reducing the risk of forming abnormal blood clots that can lead to stroke. If you have the irregular heart rhythm of atrial fibrillation, you should be on a blood thinner unless there is a good reason you cannot take them.  Understand all your medicine instructions.  Make sure that other conditions (such as anemia or atherosclerosis) are addressed. SEEK IMMEDIATE MEDICAL CARE IF:   You have sudden weakness or numbness of the face, arm, or leg, especially on one side of the body.  Your face or eyelid droops to one side.  You have sudden confusion.  You have trouble speaking (aphasia) or understanding.  You have sudden trouble seeing in one or both eyes.  You have sudden trouble walking.  You have dizziness.  You have a loss of balance or coordination.  You have a sudden, severe headache with no known cause.  You have new chest pain or an irregular heartbeat. Any of these symptoms may represent a serious problem that is an emergency. Do not wait to see if the symptoms will go away. Get medical help at once. Call your local emergency services (911 in [place] Do not drive yourself to the hospital. Document Released: 02/29/2004 Document Revised: 06/07/2013 Document Reviewed: 07/24/2012 [employer] Patient Information 2015 [employer]. This information is not intended to replace advice given   to you by your health care provider. Make sure you discuss any questions you have with your health care provider.  

## 2013-11-29 NOTE — Addendum Note (Signed)
Addended by: Dorthula Rue L on: 11/29/2013 02:47 PM   Modules accepted: Orders

## 2013-12-20 ENCOUNTER — Other Ambulatory Visit: Payer: Self-pay | Admitting: Family Medicine

## 2013-12-20 NOTE — Telephone Encounter (Signed)
Received refill request electronically from pharmacy. Last refill 06/22/13 #60/2 refills. Is it okay to refill medication?

## 2013-12-20 NOTE — Telephone Encounter (Signed)
[name] pts son left v/m requesting status of alprazolam refill to [name].Please advise.

## 2013-12-21 NOTE — Telephone Encounter (Signed)
Rx called to pharmacy. Dominica Severin notified as instructed by telephone.

## 2013-12-21 NOTE — Telephone Encounter (Signed)
Please call in.  We always need 24 hours on controlled meds, minimum.

## 2013-12-29 ENCOUNTER — Other Ambulatory Visit: Payer: Self-pay | Admitting: Family Medicine

## 2014-01-04 ENCOUNTER — Emergency Department (HOSPITAL_COMMUNITY)
Admission: EM | Admit: 2014-01-04 | Discharge: 2014-01-04 | Disposition: A | Discharge status: Home / Self Care | Payer: Medicare Other | Attending: Emergency Medicine | Admitting: Emergency Medicine

## 2014-01-04 ENCOUNTER — Telehealth: Payer: Self-pay | Admitting: Family Medicine

## 2014-01-04 ENCOUNTER — Emergency Department (HOSPITAL_COMMUNITY): Payer: Medicare Other

## 2014-01-04 DIAGNOSIS — Z85828 Personal history of other malignant neoplasm of skin: Secondary | ICD-10-CM | POA: Diagnosis not present

## 2014-01-04 DIAGNOSIS — Z9889 Other specified postprocedural states: Secondary | ICD-10-CM | POA: Insufficient documentation

## 2014-01-04 DIAGNOSIS — R4182 Altered mental status, unspecified: Secondary | ICD-10-CM | POA: Diagnosis present

## 2014-01-04 DIAGNOSIS — E785 Hyperlipidemia, unspecified: Secondary | ICD-10-CM | POA: Insufficient documentation

## 2014-01-04 DIAGNOSIS — Z79899 Other long term (current) drug therapy: Secondary | ICD-10-CM | POA: Insufficient documentation

## 2014-01-04 DIAGNOSIS — Z8673 Personal history of transient ischemic attack (TIA), and cerebral infarction without residual deficits: Secondary | ICD-10-CM | POA: Diagnosis not present

## 2014-01-04 DIAGNOSIS — M109 Gout, unspecified: Secondary | ICD-10-CM | POA: Diagnosis not present

## 2014-01-04 DIAGNOSIS — W06XXXA Fall from bed, initial encounter: Secondary | ICD-10-CM | POA: Insufficient documentation

## 2014-01-04 DIAGNOSIS — I2510 Atherosclerotic heart disease of native coronary artery without angina pectoris: Secondary | ICD-10-CM | POA: Diagnosis not present

## 2014-01-04 DIAGNOSIS — Z8659 Personal history of other mental and behavioral disorders: Secondary | ICD-10-CM | POA: Insufficient documentation

## 2014-01-04 DIAGNOSIS — I10 Essential (primary) hypertension: Secondary | ICD-10-CM | POA: Insufficient documentation

## 2014-01-04 DIAGNOSIS — Y9389 Activity, other specified: Secondary | ICD-10-CM | POA: Diagnosis not present

## 2014-01-04 DIAGNOSIS — Y92009 Unspecified place in unspecified non-institutional (private) residence as the place of occurrence of the external cause: Secondary | ICD-10-CM

## 2014-01-04 DIAGNOSIS — Z87891 Personal history of nicotine dependence: Secondary | ICD-10-CM | POA: Insufficient documentation

## 2014-01-04 DIAGNOSIS — E119 Type 2 diabetes mellitus without complications: Secondary | ICD-10-CM | POA: Diagnosis not present

## 2014-01-04 DIAGNOSIS — Y990 Civilian activity done for income or pay: Secondary | ICD-10-CM | POA: Diagnosis not present

## 2014-01-04 DIAGNOSIS — R410 Disorientation, unspecified: Secondary | ICD-10-CM | POA: Insufficient documentation

## 2014-01-04 DIAGNOSIS — W19XXXA Unspecified fall, initial encounter: Secondary | ICD-10-CM

## 2014-01-04 DIAGNOSIS — Z043 Encounter for examination and observation following other accident: Secondary | ICD-10-CM | POA: Insufficient documentation

## 2014-01-04 DIAGNOSIS — Y92018 Other place in single-family (private) house as the place of occurrence of the external cause: Secondary | ICD-10-CM | POA: Insufficient documentation

## 2014-01-04 LAB — CBC
HCT: 42.1 % (ref 39.0–52.0)
HEMOGLOBIN: 14.4 g/dL (ref 13.0–17.0)
MCH: 31.7 pg (ref 26.0–34.0)
MCHC: 34.2 g/dL (ref 30.0–36.0)
MCV: 92.7 fL (ref 78.0–100.0)
Platelets: 226 10*3/uL (ref 150–400)
RBC: 4.54 MIL/uL (ref 4.22–5.81)
RDW: 14.2 % (ref 11.5–15.5)
WBC: 9.4 10*3/uL (ref 4.0–10.5)

## 2014-01-04 LAB — URINALYSIS, ROUTINE W REFLEX MICROSCOPIC
Bilirubin Urine: NEGATIVE
Glucose, UA: NEGATIVE mg/dL
Ketones, ur: NEGATIVE mg/dL
Leukocytes, UA: NEGATIVE
Nitrite: NEGATIVE
Protein, ur: NEGATIVE mg/dL
SPECIFIC GRAVITY, URINE: 1.015 (ref 1.005–1.030)
UROBILINOGEN UA: 1 mg/dL (ref 0.0–1.0)
pH: 5.5 (ref 5.0–8.0)

## 2014-01-04 LAB — COMPREHENSIVE METABOLIC PANEL
ALK PHOS: 93 U/L (ref 39–117)
ALT: 14 U/L (ref 0–53)
AST: 23 U/L (ref 0–37)
Albumin: 3.7 g/dL (ref 3.5–5.2)
Anion gap: 18 — ABNORMAL HIGH (ref 5–15)
BUN: 15 mg/dL (ref 6–23)
CHLORIDE: 96 meq/L (ref 96–112)
CO2: 26 meq/L (ref 19–32)
Calcium: 9.2 mg/dL (ref 8.4–10.5)
Creatinine, Ser: 1.54 mg/dL — ABNORMAL HIGH (ref 0.50–1.35)
GFR calc Af Amer: 45 mL/min — ABNORMAL LOW (ref >90)
GFR, EST NON AFRICAN AMERICAN: 39 mL/min — AB (ref >90)
Glucose, Bld: 113 mg/dL — ABNORMAL HIGH (ref 70–99)
Potassium: 3.7 mEq/L (ref 3.7–5.3)
SODIUM: 140 meq/L (ref 137–147)
TOTAL PROTEIN: 7.7 g/dL (ref 6.0–8.3)
Total Bilirubin: 0.9 mg/dL (ref 0.3–1.2)

## 2014-01-04 LAB — URINE MICROSCOPIC-ADD ON

## 2014-01-04 LAB — PROTIME-INR
INR: 1.51 — AB (ref 0.00–1.49)
Prothrombin Time: 18.4 seconds — ABNORMAL HIGH (ref 11.6–15.2)

## 2014-01-04 LAB — CBG MONITORING, ED: GLUCOSE-CAPILLARY: 107 mg/dL — AB (ref 70–99)

## 2014-01-04 NOTE — ED Provider Notes (Signed)
Pt here from home for fall this morning and some episodes of confusion Son admits that patient will have intermittent episodes of confusion at home.  He is now at baseline He has no focal weakness He is ambulatory in the ED without ataxia He is awake/alert, he is conversant, no arm/leg drift and is at baseline Due to episodes of confusion, I offered admission,but patient/son feel more comfortable monitoring at home.  He lives with son/family.  He will f/u with PCP next week  He had some hypoxia that resolved, denies cp/sob/cough and no added lung sounds   Sharyon Cable, MD 01/04/14 2122

## 2014-01-04 NOTE — ED Notes (Addendum)
Inc. Confusion - hx. Of dementia; pt. Found on knees pulling himself back up into bed. According to family, "no abrasions." unwitnessed fall. Pt. On blood thinner. Found ~ 0600.  Pt. Has hx. Of carotid issues; some dizzy spells; abnormal bp on both arms.

## 2014-01-04 NOTE — Discharge Instructions (Signed)
Delirium °Delirium (acute confusional state) is a sudden change in a person's brain function that causes the person to become confused for a short period of time. People with delirium often have trouble knowing where they are. Delirium comes on very fast. It can develop in a few days or just a few hours. Delirium usually occurs because of another mental or physical condition. For example, a person might develop delirium after a surgery. It is especially common in elderly people who are sick or in the hospital. People with dementia (a brain disease like Alzheimer's) or people who are near death may also develop delirium.  °CAUSES  °Delirium occurs when something affects the signals that the brain sends out. These signals can be affected by anything that puts stress on the body and brain, causing brain chemicals to be out of balance. Structural health problems such as acute strokes, bleeding near the brain (intracranial bleeds), and trauma can also cause delirium. Sometimes the exact cause of delirium is not known. Usually, several factors contribute to the development of delirium. Things that may cause a person to develop delirium include: °· Surgery, especially when anesthetics are involved. °· Chronic medical conditions, such as chronic lung, heart, or kidney disease. °· Fever. °· Low body temperature (hypothermia). °· Infection, such as pneumonia, severe intestinal infection, severe skin infection, or urinary tract infection. °· Poor nutrition that leads to very low vitamin or protein levels in the body (malnutrition). °· Body fluid loss (dehydration). °· Low blood sugar. °· High blood sugar, which typically occurs in people with severe diabetes. °· Electrolyte abnormalities, such as sodium imbalance or acid-base disorders. °· Low oxygen level. °· Low blood pressure. °· Uncontrolled high blood pressure. °· Brain injury (trauma). °· Stroke. °· Abuse of alcohol or sudden withdrawal of alcohol. °· Sudden tobacco  withdrawal if the person is a longtime smoker. °· Loss of vision or hearing. °· Being strapped down (restrained). °· Being in a new setting, such as an elderly person being admitted to a hospital. °· Taking illegal drugs or quitting use of those drugs. °· Taking certain medicines for pain, sleep, allergies, high blood pressure, anxiety, depression, Parkinson disease, or seizures. °· Sundowning syndrome, which is a complication of chronic dementia that can occur in the later part of a person's wake cycle. °SYMPTOMS  °The main sign of delirium is a sudden change in a person's mental state. This change can come and go. Symptoms may include: °· Not being able to pay attention. °· Being confused about places, time, and people. °· Seeing, hearing, or feeling things that are not real (hallucinations). °· Changes in sleep patterns. °· Being restless, hyperactive, irritable, and angry. °· Extreme mood swings. °· Rambling and senseless talking. °· Difficulty speaking or understanding speech. °· Memory loss. °· Changes in consciousness, such as being sleepy, sluggish, lethargic, and withdrawn. °· Focusing on things or ideas that are not important. °· Unusual body movements or shaking (tremors). °DIAGNOSIS  °There are no specific tests for delirium, but a health care provider may do the following: °· Perform a mental status assessment. The health care provider will check for confusion and lack of awareness by talking with the person and asking questions. °· Talk with the person's friends and family. A friend or family member will often need to tell the health care provider about the person's symptoms and medical history, including medicines taken or missed. °· Perform a physical exam. The health care provider will perform a physical exam to check for underlying   conditions that may lead to delirium, such as dehydration, malnutrition, trauma, and infection. The exam may include checking for changes in vision, hearing, and the way  the person moves (coordination and reflexes). The health care provider may order tests, such as:  Blood tests.  Urine tests.  Brain imaging, such as a CT scan or MRI scan.  X-rays (to look for lung problems or intestinal blockage). TREATMENT  Treatment will focus on the cause of the delirium. Delirium is a sign of another problem. If that problem can be found and treated, the delirium may go away. Full recovery can take several weeks. Keeping the person safe until the cause can be found and treated (supportive care) is often what is needed. In some cases, medicine may be prescribed to help keep the person calm. People with delirium should not be left alone because they may involuntarily harm themselves. HOME CARE INSTRUCTIONS  Supportive care is provided by the person living with, or caring for, the person with delirium. It involves keeping the person safe, encouraging healthy habits, and helping the person stay aware of his or her surroundings. Take these steps to help care for someone with delirium:  Make sure the person eats a healthy diet.  Make sure the person gets enough fluids. The person should drink enough fluids to keep urine clear or pale yellow.  Do not leave the person alone.  Keep the person on a regular schedule. Maintain regular times for meals, sleeping, and being active.  Keep the home as quiet and stress-free as possible. This is very important at night and will help improve the quality of the person's sleep.  Let lots of sunlight into the home during the day.  Avoid total darkness at night.  Help the person maintain good hygiene to avoid skin infections, bed sores, and pressure ulcers.  Do activities outside as often as possible.  Take the person to see others whenever you can.  Make sure the person uses hearing aids and eyeglasses if needed.  Give frequent verbal reminders of the current time, location, and situation.  Use memory cues, such as clocks,  calendars, and family photos.  Try to keep the person calm. Music or relaxation techniques may be helpful.  Always be on the lookout for symptoms of delirium.  Do not use restraints.  Only give over-the-counter or prescription medicines as directed by the health care provider.  Make sure the person takes all medicines on a regular schedule as directed by the health care provider.  Keep all follow-up appointments. SEEK MEDICAL CARE IF:  Any of the person's symptoms become worse.  New signs of delirium develop.  Caring for the person at home does not seem safe.  The person stops eating, drinking, or communicating.  The person develops vomiting. SEEK IMMEDIATE MEDICAL CARE IF:   Symptoms of delirium do not go away after treatment.  The person develops chest pain or shortness of breath.  The person seems to want to harm someone or harm himself or herself. MAKE SURE YOU:   Understand these instructions.  Will watch the condition.  Will get help right away if the person is not doing well or gets worse. Document Released: 10/16/2011 Document Revised: 06/07/2013 Document Reviewed: 10/16/2011 [facility] Patient Information 2015 [place], [place]. This information is not intended to replace advice given to you by your health care provider. Make sure you discuss any questions you have with your health care provider.  Fall Prevention and Home Safety Falls cause injuries  and can affect all age groups. It is possible to use preventive measures to significantly decrease the likelihood of falls. There are many simple measures which can make your home safer and prevent falls. OUTDOORS  Repair cracks and edges of walkways and driveways.  Remove high doorway thresholds.  Trim shrubbery on the main path into your home.  Have good outside lighting.  Clear walkways of tools, rocks, debris, and clutter.  Check that handrails are not broken and are securely fastened. Both sides of steps  should have handrails.  Have leaves, snow, and ice cleared regularly.  Use sand or salt on walkways during winter months.  In the garage, clean up grease or oil spills. BATHROOM  Install night lights.  Install grab bars by the toilet and in the tub and shower.  Use non-skid mats or decals in the tub or shower.  Place a plastic non-slip stool in the shower to sit on, if needed.  Keep floors dry and clean up all water on the floor immediately.  Remove soap buildup in the tub or shower on a regular basis.  Secure bath mats with non-slip, double-sided rug tape.  Remove throw rugs and tripping hazards from the floors. BEDROOMS  Install night lights.  Make sure a bedside light is easy to reach.  Do not use oversized bedding.  Keep a telephone by your bedside.  Have a firm chair with side arms to use for getting dressed.  Remove throw rugs and tripping hazards from the floor. KITCHEN  Keep handles on pots and pans turned toward the center of the stove. Use back burners when possible.  Clean up spills quickly and allow time for drying.  Avoid walking on wet floors.  Avoid hot utensils and knives.  Position shelves so they are not too high or low.  Place commonly used objects within easy reach.  If necessary, use a sturdy step stool with a grab bar when reaching.  Keep electrical cables out of the way.  Do not use floor polish or wax that makes floors slippery. If you must use wax, use non-skid floor wax.  Remove throw rugs and tripping hazards from the floor. STAIRWAYS  Never leave objects on stairs.  Place handrails on both sides of stairways and use them. Fix any loose handrails. Make sure handrails on both sides of the stairways are as long as the stairs.  Check carpeting to make sure it is firmly attached along stairs. Make repairs to worn or loose carpet promptly.  Avoid placing throw rugs at the top or bottom of stairways, or properly secure the rug  with carpet tape to prevent slippage. Get rid of throw rugs, if possible.  Have an electrician put in a light switch at the top and bottom of the stairs. OTHER FALL PREVENTION TIPS  Wear low-heel or rubber-soled shoes that are supportive and fit well. Wear closed toe shoes.  When using a stepladder, make sure it is fully opened and both spreaders are firmly locked. Do not climb a closed stepladder.  Add color or contrast paint or tape to grab bars and handrails in your home. Place contrasting color strips on first and last steps.  Learn and use mobility aids as needed. Install an electrical emergency response system.  Turn on lights to avoid dark areas. Replace light bulbs that burn out immediately. Get light switches that glow.  Arrange furniture to create clear pathways. Keep furniture in the same place.  Firmly attach carpet with non-skid or  double-sided tape.  Eliminate uneven floor surfaces.  Select a carpet pattern that does not visually hide the edge of steps.  Be aware of all pets. OTHER HOME SAFETY TIPS  Set the water temperature for 120 F (48.8 C).  Keep emergency numbers on or near the telephone.  Keep smoke detectors on every level of the home and near sleeping areas. Document Released: 01/11/2002 Document Revised: 07/23/2011 Document Reviewed: 04/12/2011 [facility] Patient Information 2015 [place], [place]. This information is not intended to replace advice given to you by your health care provider. Make sure you discuss any questions you have with your health care provider.

## 2014-01-04 NOTE — ED Notes (Signed)
Pt has hx dementia. Son heard patient fall out of bed this morning; when son got to bedside, pt was attempting to pull self up into bed. Pt denies falling; no injuries noted. Pt is on blood thinners for CAD and stents per son. Per family: pt has been more altered since fall; reports pt has been talking to people who are not there. Pt A&Ox4; NIH is negative

## 2014-01-04 NOTE — ED Provider Notes (Signed)
CSN: [id number]     Arrival date & time 01/04/14  1613 History   First MD Initiated Contact with Patient 01/04/14 1707     Chief Complaint  Patient presents with  . Altered Mental Status     (Consider location/radiation/quality/duration/timing/severity/associated sxs/prior Treatment) HPI Patient is a 80 year old male who presents following a fall this morning. As a history of dementia, and lives at home with his son. Unwitnessed fall this morning while getting out of bed, unknown whether he hit his head or not. Son did not notice any trauma to his head or neck after it happened, but says over the course of the day he has been more confused than usual. The son reports that over the past week or so he has had several episodes where he has been having waxing and waning episodes of worsening confusion, disorientation, and occasionally hallucination. He has not had any fevers or felt ill recently, he has not had any dysuria cough or other infectious symptoms.  Past Medical History  Diagnosis Date  . Anxiety   . Hypertension   . Hyperlipidemia   . CAD (coronary artery disease)   . Dementia   . Gout, unspecified 2012    crystal analysis at urgent care  . AF (atrial fibrillation)   . 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 Cancer 02/25/11    Squamous Cell Carcinoma- Left Dorsum Hand  . Type II or unspecified type diabetes mellitus without mention of complication, not stated as uncontrolled 06/18/2006  . Stroke 07/13/11    Acute left parietal subcortical and periventricular white matter infarction.  . Carotid artery occlusion    Past Surgical History  Procedure Laterality Date  . Cholecystectomy    . Endarterectomy  1996    Bilateral (2 weeks apart)  . Skin cancer removal  06/2007    (? basal cell)  . 4 vessel cabg  06/97  . Mri brain  02/23/10    No acute abnormality Chronic Cerebral Ischemia Stable Ventr Prominence due to Volume loss  . Pseudoaneurysm repair   08/26/10    Right carotid endarterectomy pseudoaneurysm  . Peg placement    . Carotid endarterectomy  03/11/02 and July 2013   Family History  Problem Relation Age of Onset  . Colon cancer Sister   . Hypertension Brother   . Cancer Brother     intestine  . Brain cancer Brother   . Melanoma Sister   . Coronary artery disease Son     49, heart attack  . Diabetes Son   . Heart disease Son     After 52 yrs of age  . Hypertension Son   . Heart attack Son   . Other Mother     Intestinal  Blockage  . Other Father     Intestinal Blockage  . Diabetes Daughter   . Hypertension Daughter   . Hypertension Son    History  Substance Use Topics  . Smoking status: Former Smoker    Types: Cigarettes  . Smokeless tobacco: Never Used     Comment: Remote mild smoking history  . Alcohol Use: No    Review of Systems  Gastrointestinal: Negative for abdominal pain and abdominal distention.  Genitourinary: Negative for dysuria and enuresis.  Neurological: Negative for seizures, facial asymmetry, light-headedness, numbness and headaches.       Confusion  All other systems reviewed and are negative.     Allergies  Lorazepam; Naproxen sodium; Niacin; Ramipril; Sulfonamide  derivatives; and Zolpidem tartrate  Home Medications   Prior to Admission medications   Medication Sig Start Date End Date Taking? Authorizing Provider  allopurinol (ZYLOPRIM) 100 MG tablet take 1 tablet by mouth once daily   Yes Tonia Ghent, MD  ALPRAZolam Duanne Moron) 0.25 MG tablet Take 0.125 mg by mouth 2 (two) times daily.   Yes Historical Provider, MD  furosemide (LASIX) 40 MG tablet take 1 tablet by mouth daily   Yes Tonia Ghent, MD  memantine [facility]) 10 MG tablet take 1 tablet by mouth twice a day 12/29/13  Yes Tonia Ghent, MD  metoprolol (LOPRESSOR) 50 MG tablet Take 50 mg by mouth 2 (two) times daily.   Yes Historical Provider, MD  pantoprazole (PROTONIX) 40 MG tablet take 1 tablet by mouth twice a  day   Yes Tonia Ghent, MD  rosuvastatin (CRESTOR) 20 MG tablet Take 1 tablet (20 mg total) by mouth daily.   Yes Tonia Ghent, MD  sertraline (ZOLOFT) 50 MG tablet Take 50 mg by mouth at bedtime.   Yes Historical Provider, MD  XARELTO 15 MG TABS tablet take 1 tablet by mouth once daily   Yes Tonia Ghent, MD  ALPRAZolam Duanne Moron) 0.25 MG tablet take 1/2 to 1 tablet by mouth twice a day if needed for anxiety Patient not taking: Reported on 01/04/2014 12/21/13   Tonia Ghent, MD  metoprolol (LOPRESSOR) 50 MG tablet TAKE 1 AND 1/2 TABLETS TWICE DAILY Patient not taking: Reported on 01/04/2014    Tonia Ghent, MD  sertraline (ZOLOFT) 50 MG tablet take 1 and 1/2 tablet by mouth daily Patient not taking: Reported on 01/04/2014    Tonia Ghent, MD   BP 130/66 mmHg  Pulse 66  Temp(Src) 98.2 F (36.8 C) (Oral)  Resp 15  SpO2 92% Physical Exam  Constitutional: He is oriented to person, place, and time. He appears well-developed and well-nourished. No distress.  HENT:  Head: Normocephalic and atraumatic.  Right Ear: External ear normal.  Left Ear: External ear normal.  Eyes: Conjunctivae are normal. Pupils are equal, round, and reactive to light.  Neck: Normal range of motion. Neck supple. No JVD present.  Cardiovascular: Normal rate, regular rhythm and intact distal pulses.   Pulmonary/Chest: Effort normal and breath sounds normal. No respiratory distress. He has no wheezes.  Abdominal: Soft. Bowel sounds are normal. He exhibits no distension. There is no tenderness.  Genitourinary: Penis normal.  Musculoskeletal: Normal range of motion.  No evidence of trauma on physical exam of the extremities. No tenderness to palpation of the cervical thoracic or lumbar spine.  Neurological: He is alert and oriented to person, place, and time.  Motor and sensory function intact in bilateral upper and lower extremities, slow to respond to questions, mildly confused, but son reports this is at  baseline. No facial symmetry, no normal clear speech, patient able to ambulate without difficulty at his normal gait.    Skin: Skin is warm and dry.  Psychiatric: He has a normal mood and affect.  Nursing note and vitals reviewed.   ED Course  Procedures (including critical care time) Labs Review Labs Reviewed  COMPREHENSIVE METABOLIC PANEL - Abnormal; Notable for the following:    Glucose, Bld 113 (*)    Creatinine, Ser 1.54 (*)    GFR calc non Af Amer 39 (*)    GFR calc Af Amer 45 (*)    Anion gap 18 (*)    All other  components within normal limits  URINALYSIS, ROUTINE W REFLEX MICROSCOPIC - Abnormal; Notable for the following:    Hgb urine dipstick TRACE (*)    All other components within normal limits  PROTIME-INR - Abnormal; Notable for the following:    Prothrombin Time 18.4 (*)    INR 1.51 (*)    All other components within normal limits  URINE MICROSCOPIC-ADD ON - Abnormal; Notable for the following:    Casts HYALINE CASTS (*)    All other components within normal limits  CBG MONITORING, ED - Abnormal; Notable for the following:    Glucose-Capillary 107 (*)    All other components within normal limits  URINE CULTURE  CBC  CBG MONITORING, ED  CBG MONITORING, ED    Imaging Review Ct Head Wo Contrast  01/04/2014   CLINICAL DATA:  Fall out of bed this morning. Altered mental status.  EXAM: CT HEAD WITHOUT CONTRAST  TECHNIQUE: Contiguous axial images were obtained from the base of the skull through the vertex without intravenous contrast.  COMPARISON:  03/21/2013.  FINDINGS: Old LEFT cerebellar infarct. Tiny RIGHT cerebellar lacunar infarct. Old LEFT caudate head lacunar infarct. No mass lesion, mass effect, midline shift, hydrocephalus, hemorrhage. No acute territorial cortical ischemia/infarct. Atrophy and chronic ischemic white matter disease is present. The calvarium is intact. Mastoid air cells are clear. Visible paranasal sinuses are within normal limits.  IMPRESSION:  1. Atrophy and chronic ischemic white matter disease without acute intracranial abnormality. 2. Old cerebellar and LEFT caudate head infarcts.   Electronically Signed   By: Dereck Ligas M.D.   On: 01/04/2014 18:03     EKG Interpretation   Date/Time:  Tuesday January 04 2014 16:23:47 EST Ventricular Rate:  70 PR Interval:    QRS Duration: 90 QT Interval:  416 QTC Calculation: 449 R Axis:   102 Text Interpretation:  Undetermined rhythm Rightward axis Anterior infarct  , age undetermined Abnormal ECG No significant change since last tracing  Confirmed by Christy Gentles  MD, [place] ([id number]) on 01/04/2014 6:30:37 PM      MDM   Final diagnoses:  Fall at home, initial encounter  Delirium   80 year-old male with history of dementia and presents since following a fall. He is on Xarelto for atrial fibrillation, so is at higher risk for intracranial bleeding, but CT of the head shows no acute bleeds. Physical exam does not reveal any other signs of trauma. No metabolic abnormalities that would be leading to his delirium identified. No evidence of infectious causes such as urinary tract infection. Discussed with the son the potential for workup as an inpatient for his delirium, however the son felt comfortable with doing this as an outpatient, and said he felt very comfortable taking care of him at home, as this confusion is not a new issue.  Discussed return precautions with the son, who agrees, and understands, and will return for acute worsening of his condition.    Leata Mouse, MD 01/05/14 [id number]  Sharyon Cable, MD 01/05/14 [phone]

## 2014-01-04 NOTE — Telephone Encounter (Signed)
[facility] took call this afternoon.  Michele Rockers, RN documented: Caller states her dad has Alzheimer's and her brother heard him fall about 6:00 a.m. Her brother checked him and he appeared to be ok. Caller states that he thinks it is snowing and may be hearing people that are not there? He is talking Nonsense.  We had no appointment openings here at [facility] or across [place], so patient sent to ED.

## 2014-01-05 NOTE — Telephone Encounter (Signed)
Agree with ED. Thanks.

## 2014-01-06 ENCOUNTER — Other Ambulatory Visit: Payer: Self-pay | Admitting: Family Medicine

## 2014-01-06 DIAGNOSIS — E119 Type 2 diabetes mellitus without complications: Secondary | ICD-10-CM

## 2014-01-06 DIAGNOSIS — Z8739 Personal history of other diseases of the musculoskeletal system and connective tissue: Secondary | ICD-10-CM

## 2014-01-06 LAB — URINE CULTURE
CULTURE: NO GROWTH
Colony Count: NO GROWTH

## 2014-01-13 ENCOUNTER — Other Ambulatory Visit (INDEPENDENT_AMBULATORY_CARE_PROVIDER_SITE_OTHER): Payer: Medicare Other

## 2014-01-13 DIAGNOSIS — E119 Type 2 diabetes mellitus without complications: Secondary | ICD-10-CM

## 2014-01-13 DIAGNOSIS — E785 Hyperlipidemia, unspecified: Secondary | ICD-10-CM

## 2014-01-13 DIAGNOSIS — Z8639 Personal history of other endocrine, nutritional and metabolic disease: Secondary | ICD-10-CM

## 2014-01-13 DIAGNOSIS — Z8739 Personal history of other diseases of the musculoskeletal system and connective tissue: Secondary | ICD-10-CM

## 2014-01-13 LAB — LDL CHOLESTEROL, DIRECT: Direct LDL: 71.1 mg/dL

## 2014-01-13 LAB — LIPID PANEL
CHOLESTEROL: 136 mg/dL (ref 0–200)
HDL: 34.7 mg/dL — AB (ref >39.00)
NonHDL: 101.3
TRIGLYCERIDES: 258 mg/dL — AB (ref 0.0–149.0)
Total CHOL/HDL Ratio: 4
VLDL: 51.6 mg/dL — AB (ref 0.0–40.0)

## 2014-01-13 LAB — HEMOGLOBIN A1C: HEMOGLOBIN A1C: 6.8 % — AB (ref 4.6–6.5)

## 2014-01-13 LAB — URIC ACID: Uric Acid, Serum: 7.9 mg/dL — ABNORMAL HIGH (ref 4.0–7.8)

## 2014-01-17 ENCOUNTER — Ambulatory Visit (INDEPENDENT_AMBULATORY_CARE_PROVIDER_SITE_OTHER): Payer: Medicare Other | Admitting: Podiatry

## 2014-01-17 DIAGNOSIS — M79673 Pain in unspecified foot: Secondary | ICD-10-CM

## 2014-01-17 DIAGNOSIS — B351 Tinea unguium: Secondary | ICD-10-CM

## 2014-01-17 NOTE — Progress Notes (Signed)
   Subjective:    Patient ID: [name], male    DOB: 02/03/1925, 80 y.o.   MRN: [id number]  HPI Comments: Patient presents with thick nailbeds and pain 1-5 both feet that he cannot cut.  Review of Systems  Objective:   Physical Exam Neurovascular status intact with thick painful nailbeds 1-5 both feet that are painful.  Assessment:  Mycotic nail infection with pain 1-5 both feet.  Plan:  Debridement painful nailbeds 1-5 both feet with no iatrogenic bleeding noted.     Review of Systems     Objective:   Physical Exam        Assessment & Plan:   

## 2014-01-18 ENCOUNTER — Other Ambulatory Visit: Payer: Self-pay | Admitting: Family Medicine

## 2014-01-20 ENCOUNTER — Encounter: Payer: Self-pay | Admitting: Family Medicine

## 2014-01-20 ENCOUNTER — Ambulatory Visit (INDEPENDENT_AMBULATORY_CARE_PROVIDER_SITE_OTHER): Payer: Medicare Other | Admitting: Family Medicine

## 2014-01-20 VITALS — BP 104/60 | HR 68 | Temp 98.5°F | Wt 179.8 lb

## 2014-01-20 DIAGNOSIS — Z7189 Other specified counseling: Secondary | ICD-10-CM

## 2014-01-20 DIAGNOSIS — F0390 Unspecified dementia without behavioral disturbance: Secondary | ICD-10-CM

## 2014-01-20 DIAGNOSIS — I10 Essential (primary) hypertension: Secondary | ICD-10-CM

## 2014-01-20 DIAGNOSIS — F411 Generalized anxiety disorder: Secondary | ICD-10-CM

## 2014-01-20 DIAGNOSIS — M109 Gout, unspecified: Secondary | ICD-10-CM

## 2014-01-20 DIAGNOSIS — I639 Cerebral infarction, unspecified: Secondary | ICD-10-CM

## 2014-01-20 DIAGNOSIS — E119 Type 2 diabetes mellitus without complications: Secondary | ICD-10-CM

## 2014-01-20 DIAGNOSIS — Z0000 Encounter for general adult medical examination without abnormal findings: Secondary | ICD-10-CM

## 2014-01-20 MED ORDER — MEMANTINE HCL 10 MG PO TABS: 10.0000 mg | ORAL_TABLET | Freq: Two times a day (BID) | ORAL | Status: DC

## 2014-01-20 MED ORDER — FUROSEMIDE 40 MG PO TABS
20.0000 mg | ORAL_TABLET | Freq: Every day | ORAL | Status: DC
Start: 2014-01-20 — End: 2015-01-18

## 2014-01-20 NOTE — Patient Instructions (Addendum)
Check with your insurance to see if they will cover the shingles shot. If lightheaded, then cut the furosemide in half. If the swelling increases, then go back to 1 pill a day.  Take care.  Glad to see you.  Recheck in about 6 months, labs ahead of time.

## 2014-01-20 NOTE — Progress Notes (Signed)
Pre visit review using our clinic review tool, if applicable. No additional management support is needed unless otherwise documented below in the visit note.  I have personally reviewed the Medicare Annual Wellness questionnaire and have noted 1. The patient's medical and social history 2. Their use of alcohol, tobacco or illicit drugs 3. Their current medications and supplements 4. The patient's functional ability including ADL's, fall risks, home safety risks and hearing or visual             impairment. 5. Diet and physical activities 6. Evidence for depression or mood disorders  The patients weight, height, BMI have been recorded in the chart and visual acuity is per eye clinic.  I have made referrals, counseling and provided education to the patient based review of the above and I have provided the pt with a written personalized care plan for preventive services.  Provider list updated- see scanned forms.  Routine anticipatory guidance given to patient.  See health maintenance.  Flu 2015 Shingles deferred for now, d/w pt and son PNA 2012 Tetanus 2006 Colonoscopy NA due to age and memory loss Prostate cancer screening NA due to age and memory loss Advance directive- son Dominica Severin designated if patient were incapacitated.  Cognitive function addressed- see scanned forms- and if abnormal then additional documentation follows.   Memory loss- gradual changes noted, progressive over the last year.  Family is helping constantly.  Mostly with son Dominica Severin, other kids helping out.  At home.  Wants to continue to stay at home.  Needs constant supervision from family.  No acute changes, just progressive decline with less recall over the last few months.   Anxiety, controlled with current meds.  No ADE, no sedation on meds.  Doing well overall.   Hypertension:    Using medication without problems or lightheadedness: occ lightheaded, d/w pt/[place] about cutting lasix back by 50% prn.  Chest pain with  exertion:no Edema:no Short of breath:no  Gout, no flares, compliant with meds.  No ADE on meds.   DM2.  No meds.  No sugar checks.  Has been controlled with diet.  Labs discussed.   L subclavian stenosis per vascular clinic.  There is no plan for intervention at this point.   PMH and SH reviewed  Meds, vitals, and allergies reviewed.   ROS: See HPI.  Otherwise negative.    GEN: nad, alert but not oriented to year.  Is oriented to self.   HEENT: mucous membranes moist NECK: supple w/o LA CV: rrr. PULM: ctab, no inc wob ABD: soft, +bs EXT: no edema SKIN: no acute rash  Diabetic foot exam: Normal inspection No skin breakdown Plantar calluses note B 1+ DP pulses Normal sensation to light touch and monofilament Nails thickened

## 2014-01-23 NOTE — Assessment & Plan Note (Signed)
No acute changes, continue current meds.  Needs supervision from family, has support.

## 2014-01-23 NOTE — Assessment & Plan Note (Signed)
No acute changes, continue current meds.

## 2014-01-23 NOTE — Assessment & Plan Note (Signed)
Controlled , no change in meds 

## 2014-01-23 NOTE — Assessment & Plan Note (Signed)
Continue current meds but if lightheaded, then cut the furosemide in half. If the swelling increases, then go back to 1 pill a day.  Pt and son agree.  Labs d/w pt.

## 2014-01-23 NOTE — Assessment & Plan Note (Signed)
No recent flares, no change in meds. Labs d/w pt/son.

## 2014-01-23 NOTE — Assessment & Plan Note (Signed)
Flu 2015  Shingles deferred for now, d/w pt and son  PNA 2012  Tetanus 2006  Colonoscopy NA due to age and memory loss  Prostate cancer screening NA due to age and memory loss  Advance directive- son Dominica Severin designated if patient were incapacitated.  Cognitive function addressed- see scanned forms- and if abnormal then additional documentation follows.

## 2014-01-23 NOTE — Assessment & Plan Note (Signed)
Diet controlled, recheck in about 6 months.  No change in meds.

## 2014-03-10 ENCOUNTER — Ambulatory Visit (INDEPENDENT_AMBULATORY_CARE_PROVIDER_SITE_OTHER): Payer: Medicare Other | Admitting: Family Medicine

## 2014-03-10 ENCOUNTER — Encounter: Payer: Self-pay | Admitting: Family Medicine

## 2014-03-10 VITALS — BP 102/62 | HR 76 | Temp 98.8°F | Wt 181.2 lb

## 2014-03-10 DIAGNOSIS — J020 Streptococcal pharyngitis: Secondary | ICD-10-CM | POA: Diagnosis not present

## 2014-03-10 DIAGNOSIS — J029 Acute pharyngitis, unspecified: Secondary | ICD-10-CM

## 2014-03-10 LAB — POCT RAPID STREP A (OFFICE): RAPID STREP A SCREEN: POSITIVE — AB

## 2014-03-10 MED ORDER — AMOXICILLIN 875 MG PO TABS: 875.0000 mg | ORAL_TABLET | Freq: Two times a day (BID) | ORAL | Status: AC

## 2014-03-10 NOTE — Progress Notes (Signed)
Pre visit review using our clinic review tool, if applicable. No additional management support is needed unless otherwise documented below in the visit note.  History per family and patient due to dementia. Sx started about 3 days ago.  No FCNAVD.  No ear pain.  Some rhinorrhea.  Stuffy nose.  Some cough, mild.  ST was the main issue.  No sputum with the cough. Still eating well.  Was gargling with salt water and using chloraseptic spray.   Meds, vitals, and allergies reviewed.   ROS: See HPI.  Otherwise, noncontributory.  GEN: nad, alert and wearing his UGA bulldog had as usual.   HEENT: mucous membranes moist, tm w/o erythema, nasal exam w/o erythema, clear discharge noted,  OP with cobblestoning and erythema.   NECK: supple w/o LA CV: IRR, not tachy PULM: ctab, no inc wob EXT: no edema SKIN: no acute rash  RST pos.

## 2014-03-10 NOTE — Patient Instructions (Signed)
Strep throat.  Start amoxil, continue with chloraseptic and salt water gargles.  Take care.  Glad to see you.

## 2014-03-11 DIAGNOSIS — J020 Streptococcal pharyngitis: Secondary | ICD-10-CM | POA: Insufficient documentation

## 2014-03-11 NOTE — Assessment & Plan Note (Signed)
RST pos, start amoxil and supportive care o/w.  D/w pt and family.  F/u prn.  All agree.

## 2014-04-18 ENCOUNTER — Ambulatory Visit (INDEPENDENT_AMBULATORY_CARE_PROVIDER_SITE_OTHER): Payer: Medicare Other | Admitting: Podiatry

## 2014-04-18 ENCOUNTER — Encounter: Payer: Self-pay | Admitting: Podiatry

## 2014-04-18 DIAGNOSIS — B351 Tinea unguium: Secondary | ICD-10-CM | POA: Diagnosis not present

## 2014-04-18 DIAGNOSIS — M79673 Pain in unspecified foot: Secondary | ICD-10-CM | POA: Diagnosis not present

## 2014-04-18 NOTE — Progress Notes (Signed)
   Subjective:    Patient ID: [name], male    DOB: Nov 06, 1924, 81 y.o.   MRN: [id number]  HPI Comments: Patient presents with thick nailbeds and pain 1-5 both feet that he cannot cut.  Review of Systems  Objective:   Physical Exam Neurovascular status intact with thick painful nailbeds 1-5 both feet that are painful.  Assessment:  Mycotic nail infection with pain 1-5 both feet.  Plan:  Debridement painful nailbeds 1-5 both feet with no iatrogenic bleeding noted.     Review of Systems     Objective:   Physical Exam        Assessment & Plan:

## 2014-06-14 ENCOUNTER — Other Ambulatory Visit: Payer: Self-pay | Admitting: Family Medicine

## 2014-06-15 NOTE — Telephone Encounter (Signed)
Rx called to pharmacy as instructed. 

## 2014-06-15 NOTE — Telephone Encounter (Signed)
Received refill request electronically Last refill 12/21/13 #60/2 Last office visit 03/10/14 See allergy/contraindication. Is it okay to refill medication?

## 2014-06-15 NOTE — Telephone Encounter (Signed)
Please call in.  Thanks.   

## 2014-06-28 ENCOUNTER — Encounter (HOSPITAL_COMMUNITY): Payer: Self-pay

## 2014-06-28 ENCOUNTER — Emergency Department (HOSPITAL_COMMUNITY): Payer: Medicare Other

## 2014-06-28 ENCOUNTER — Emergency Department (HOSPITAL_COMMUNITY)
Admission: EM | Admit: 2014-06-28 | Discharge: 2014-06-28 | Disposition: A | Discharge status: Home / Self Care | Payer: Medicare Other | Attending: Emergency Medicine | Admitting: Emergency Medicine

## 2014-06-28 DIAGNOSIS — R4182 Altered mental status, unspecified: Secondary | ICD-10-CM | POA: Diagnosis not present

## 2014-06-28 DIAGNOSIS — F419 Anxiety disorder, unspecified: Secondary | ICD-10-CM | POA: Diagnosis not present

## 2014-06-28 DIAGNOSIS — R110 Nausea: Secondary | ICD-10-CM

## 2014-06-28 DIAGNOSIS — Z87891 Personal history of nicotine dependence: Secondary | ICD-10-CM | POA: Insufficient documentation

## 2014-06-28 DIAGNOSIS — Z79899 Other long term (current) drug therapy: Secondary | ICD-10-CM | POA: Diagnosis not present

## 2014-06-28 DIAGNOSIS — I2510 Atherosclerotic heart disease of native coronary artery without angina pectoris: Secondary | ICD-10-CM | POA: Insufficient documentation

## 2014-06-28 DIAGNOSIS — R197 Diarrhea, unspecified: Secondary | ICD-10-CM | POA: Insufficient documentation

## 2014-06-28 DIAGNOSIS — E119 Type 2 diabetes mellitus without complications: Secondary | ICD-10-CM | POA: Diagnosis not present

## 2014-06-28 DIAGNOSIS — F0390 Unspecified dementia without behavioral disturbance: Secondary | ICD-10-CM | POA: Diagnosis not present

## 2014-06-28 DIAGNOSIS — I10 Essential (primary) hypertension: Secondary | ICD-10-CM | POA: Diagnosis not present

## 2014-06-28 DIAGNOSIS — Z8673 Personal history of transient ischemic attack (TIA), and cerebral infarction without residual deficits: Secondary | ICD-10-CM | POA: Insufficient documentation

## 2014-06-28 DIAGNOSIS — I4891 Unspecified atrial fibrillation: Secondary | ICD-10-CM | POA: Insufficient documentation

## 2014-06-28 DIAGNOSIS — M109 Gout, unspecified: Secondary | ICD-10-CM | POA: Insufficient documentation

## 2014-06-28 DIAGNOSIS — Z7901 Long term (current) use of anticoagulants: Secondary | ICD-10-CM | POA: Diagnosis not present

## 2014-06-28 DIAGNOSIS — Z85828 Personal history of other malignant neoplasm of skin: Secondary | ICD-10-CM | POA: Insufficient documentation

## 2014-06-28 DIAGNOSIS — R6889 Other general symptoms and signs: Secondary | ICD-10-CM | POA: Diagnosis not present

## 2014-06-28 LAB — URINALYSIS, ROUTINE W REFLEX MICROSCOPIC
BILIRUBIN URINE: NEGATIVE
Glucose, UA: NEGATIVE mg/dL
HGB URINE DIPSTICK: NEGATIVE
Ketones, ur: NEGATIVE mg/dL
LEUKOCYTES UA: NEGATIVE
Nitrite: NEGATIVE
Protein, ur: NEGATIVE mg/dL
SPECIFIC GRAVITY, URINE: 1.019 (ref 1.005–1.030)
Urobilinogen, UA: 1 mg/dL (ref 0.0–1.0)
pH: 6 (ref 5.0–8.0)

## 2014-06-28 LAB — COMPREHENSIVE METABOLIC PANEL
ALK PHOS: 81 U/L (ref 38–126)
ALT: 13 U/L — ABNORMAL LOW (ref 17–63)
ANION GAP: 10 (ref 5–15)
AST: 21 U/L (ref 15–41)
Albumin: 3.6 g/dL (ref 3.5–5.0)
BUN: 16 mg/dL (ref 6–20)
CO2: 29 mmol/L (ref 22–32)
CREATININE: 1.48 mg/dL — AB (ref 0.61–1.24)
Calcium: 9 mg/dL (ref 8.9–10.3)
Chloride: 101 mmol/L (ref 101–111)
GFR calc non Af Amer: 41 mL/min — ABNORMAL LOW (ref >60)
GFR, EST AFRICAN AMERICAN: 47 mL/min — AB (ref >60)
Glucose, Bld: 129 mg/dL — ABNORMAL HIGH (ref 65–99)
Potassium: 4.7 mmol/L (ref 3.5–5.1)
SODIUM: 140 mmol/L (ref 135–145)
Total Bilirubin: 0.8 mg/dL (ref 0.3–1.2)
Total Protein: 7 g/dL (ref 6.5–8.1)

## 2014-06-28 LAB — CBC WITH DIFFERENTIAL/PLATELET
BASOS ABS: 0 10*3/uL (ref 0.0–0.1)
Basophils Relative: 0 % (ref 0–1)
EOS ABS: 0.2 10*3/uL (ref 0.0–0.7)
Eosinophils Relative: 2 % (ref 0–5)
HEMATOCRIT: 40.8 % (ref 39.0–52.0)
HEMOGLOBIN: 13.4 g/dL (ref 13.0–17.0)
LYMPHS PCT: 20 % (ref 12–46)
Lymphs Abs: 1.7 10*3/uL (ref 0.7–4.0)
MCH: 31.1 pg (ref 26.0–34.0)
MCHC: 32.8 g/dL (ref 30.0–36.0)
MCV: 94.7 fL (ref 78.0–100.0)
Monocytes Absolute: 0.7 10*3/uL (ref 0.1–1.0)
Monocytes Relative: 8 % (ref 3–12)
Neutro Abs: 5.9 10*3/uL (ref 1.7–7.7)
Neutrophils Relative %: 70 % (ref 43–77)
PLATELETS: 208 10*3/uL (ref 150–400)
RBC: 4.31 MIL/uL (ref 4.22–5.81)
RDW: 14.8 % (ref 11.5–15.5)
WBC: 8.5 10*3/uL (ref 4.0–10.5)

## 2014-06-28 LAB — TROPONIN I: Troponin I: <0.03 ng/mL (ref <0.031)

## 2014-06-28 MED ORDER — SODIUM CHLORIDE 0.9 % IV BOLUS (SEPSIS)
500.0000 mL | Freq: Once | INTRAVENOUS | Status: AC
  Administered 2014-06-28: 500 mL via INTRAVENOUS

## 2014-06-28 MED ORDER — ONDANSETRON HCL 4 MG/2ML IJ SOLN
4.0000 mg | Freq: Once | INTRAMUSCULAR | Status: AC
  Administered 2014-06-28: 4 mg via INTRAVENOUS
  Filled 2014-06-28: qty 2

## 2014-06-28 NOTE — Discharge Instructions (Signed)

## 2014-06-28 NOTE — ED Notes (Signed)
Patient transported to X-ray 

## 2014-06-28 NOTE — ED Provider Notes (Signed)
CSN: [id number]     Arrival date & time 06/28/14  1205 History   First MD Initiated Contact with Patient 06/28/14 1214     Chief Complaint  Patient presents with  . Nausea     (Consider location/radiation/quality/duration/timing/severity/associated sxs/prior Treatment) The history is provided by the patient.   patient presents with nausea. Reportedly has had nausea without vomiting and has had some diarrhea. Patient states he just feels a little bad. Denies headache. Denies confusion. Denies chest pain. Denies dysuria. Family members initially called EMS because the patient was reportedly unresponsive. Now responsive. Reports says that it may have been that he just did not want to talk to them. No sick contacts. Patient's urine reportedly smells bad.  Past Medical History  Diagnosis Date  . Anxiety   . Hypertension   . Hyperlipidemia   . CAD (coronary artery disease)   . Dementia   . Gout, unspecified 2012    crystal analysis at urgent care  . AF (atrial fibrillation)   . 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 Cancer 02/25/11    Squamous Cell Carcinoma- Left Dorsum Hand  . Type II or unspecified type diabetes mellitus without mention of complication, not stated as uncontrolled 06/18/2006  . Stroke 07/13/11    Acute left parietal subcortical and periventricular white matter infarction.  . Carotid artery occlusion    Past Surgical History  Procedure Laterality Date  . Cholecystectomy    . Endarterectomy  1996    Bilateral (2 weeks apart)  . Skin cancer removal  06/2007    (? basal cell)  . 4 vessel cabg  06/97  . Mri brain  02/23/10    No acute abnormality Chronic Cerebral Ischemia Stable Ventr Prominence due to Volume loss  . Pseudoaneurysm repair  08/26/10    Right carotid endarterectomy pseudoaneurysm  . Peg placement    . Carotid endarterectomy  03/11/02 and July 2013   Family History  Problem Relation Age of Onset  . Colon cancer Sister   .  Hypertension Brother   . Cancer Brother     intestine  . Brain cancer Brother   . Melanoma Sister   . Coronary artery disease Son     71, heart attack  . Diabetes Son   . Heart disease Son     After 48 yrs of age  . Hypertension Son   . Heart attack Son   . Other Mother     Intestinal  Blockage  . Other Father     Intestinal Blockage  . Diabetes Daughter   . Hypertension Daughter   . Hypertension Son    History  Substance Use Topics  . Smoking status: Former Smoker    Types: Cigarettes  . Smokeless tobacco: Never Used     Comment: Remote mild smoking history  . Alcohol Use: No    Review of Systems  Unable to perform ROS Constitutional: Positive for fatigue. Negative for activity change.  Respiratory: Negative for chest tightness and shortness of breath.   Cardiovascular: Negative for chest pain and leg swelling.  Gastrointestinal: Positive for nausea and diarrhea. Negative for vomiting and abdominal pain.  Genitourinary: Negative for frequency.  Musculoskeletal: Negative for back pain and neck stiffness.  Skin: Negative for rash.  Neurological: Negative for weakness and headaches.      Allergies  Lorazepam; Naproxen sodium; Niacin; Ramipril; Sulfonamide derivatives; and Zolpidem tartrate  Home Medications   Prior to Admission medications  Medication Sig Start Date End Date Taking? Authorizing Provider  allopurinol (ZYLOPRIM) 100 MG tablet take 1 tablet by mouth once daily   Yes Tonia Ghent, MD  ALPRAZolam Duanne Moron) 0.25 MG tablet take 1/2 to 1 tablet by mouth twice a day if needed for anxiety 06/15/14  Yes Tonia Ghent, MD  CRESTOR 20 MG tablet take 1 tablet by mouth once daily 01/18/14  Yes Tonia Ghent, MD  furosemide (LASIX) 40 MG tablet Take 0.5-1 tablets (20-40 mg total) by mouth daily. 01/20/14  Yes Tonia Ghent, MD  memantine (NAMENDA) 10 MG tablet Take 1 tablet (10 mg total) by mouth 2 (two) times daily. 01/20/14  Yes Tonia Ghent, MD   metoprolol (LOPRESSOR) 50 MG tablet Take 50 mg by mouth 2 (two) times daily.   Yes Historical Provider, MD  pantoprazole (PROTONIX) 40 MG tablet take 1 tablet by mouth twice a day   Yes Tonia Ghent, MD  sertraline (ZOLOFT) 50 MG tablet Take 50 mg by mouth at bedtime.   Yes Historical Provider, MD  XARELTO 15 MG TABS tablet take 1 tablet by mouth once daily   Yes Tonia Ghent, MD   BP 102/49 mmHg  Pulse 59  Temp(Src) 98.8 F (37.1 C) (Oral)  Resp 15  SpO2 99% Physical Exam  Constitutional: He appears well-developed and well-nourished.  HENT:  Head: Normocephalic.  Cardiovascular: Normal rate and regular rhythm.   Pulmonary/Chest: Effort normal.  Abdominal: Soft.  Musculoskeletal: Normal range of motion.  Neurological:  Patient is awake and appears appropriate. Able to answer questions appropriately  Skin: Skin is warm.    ED Course  Procedures (including critical care time) Labs Review Labs Reviewed  COMPREHENSIVE METABOLIC PANEL - Abnormal; Notable for the following:    Glucose, Bld 129 (*)    Creatinine, Ser 1.48 (*)    ALT 13 (*)    GFR calc non Af Amer 41 (*)    GFR calc Af Amer 47 (*)    All other components within normal limits  URINALYSIS, ROUTINE W REFLEX MICROSCOPIC  TROPONIN I  CBC WITH DIFFERENTIAL/PLATELET    Imaging Review Dg Chest 2 View  06/28/2014   CLINICAL DATA:  Altered mental status.  EXAM: CHEST  2 VIEW  COMPARISON:  March 21, 2013.  FINDINGS: The heart size and mediastinal contours are within normal limits. Status post coronary artery bypass graft. No pneumothorax or pleural effusion is noted. Both lungs are clear. The visualized skeletal structures are unremarkable.  IMPRESSION: No active cardiopulmonary disease.   Electronically Signed   By: Marijo Conception, M.D.   On: 06/28/2014 13:30     EKG Interpretation   Date/Time:  Tuesday Jun 28 2014 12:07:48 EDT Ventricular Rate:  61 PR Interval:    QRS Duration: 95 QT Interval:  587 QTC  Calculation: 591 R Axis:   101 Text Interpretation:  Atrial fibrillation Anterior infarct, old  Nonspecific T abnormalities, lateral leads Prolonged QT interval Confirmed  by Alvino Chapel  MD, Ovid Curd [phone]) on 06/28/2014 3:43:46 PM      MDM   Final diagnoses:  Nausea  Mental status, decreased    Patient had a decreased mental status followed by nausea. Back at baseline now. No chest pain. Creatinine is at baseline. Troponins negative. EKG stable. Will discharge home follow-up with his PCP.    Davonna Belling, MD 06/28/14 [phone]

## 2014-06-28 NOTE — ED Notes (Addendum)
Per EMS, pt from home.   Pt with hx of dementia.  Pt lives with family.  Pt c/o being nauseated.  Would not respond to family.  Pt has strong odor smell to urine.  Pt did speak to EMS in route.  Pt would not respond to family but vitals WNL on EMS arrival.  Vitals: 130/90, hr 68, cbg 122, 99% ra. No stroke symptoms.  EKG wnl in route.  20 g RFA, zofran 4mg  given in route

## 2014-06-28 NOTE — ED Notes (Signed)
Bed: LJ44 Expected date:  Expected time:  Means of arrival:  Comments: EMS- 81yo M, nausea, Hx of dementia

## 2014-06-28 NOTE — ED Notes (Signed)
Patient fluid challenge went well

## 2014-06-29 ENCOUNTER — Ambulatory Visit (INDEPENDENT_AMBULATORY_CARE_PROVIDER_SITE_OTHER): Payer: Medicare Other | Admitting: Family Medicine

## 2014-06-29 ENCOUNTER — Telehealth: Payer: Self-pay | Admitting: Family Medicine

## 2014-06-29 ENCOUNTER — Encounter: Payer: Self-pay | Admitting: Family Medicine

## 2014-06-29 VITALS — BP 100/70 | HR 70 | Temp 98.6°F | Wt 183.2 lb

## 2014-06-29 DIAGNOSIS — R4182 Altered mental status, unspecified: Secondary | ICD-10-CM

## 2014-06-29 NOTE — Telephone Encounter (Signed)
Patient's son [name] notified as instructed by telephone and verbalized understanding. Appointment scheduled today with [name].

## 2014-06-29 NOTE — Progress Notes (Signed)
Pre visit review using our clinic review tool, if applicable. No additional management support is needed unless otherwise documented below in the visit note.  Patient doesn't recall details from yesterday.  Information from chart and family.  Family was with him yesterday and patient wasn't acting normally, he was in bed, mumbling about "I'm not not feeling good" then patient appeared to be unresponsive, still in bed but nonverbal.  Family noted R hand and leg tremor at the time, that self resolved.  911 called.  To ER but eval was unremarkable- he got back to baseline.   Labs reviewed.  Has been at baseline since then.  He is talkative today and his mentation is at baseline.  He is pleasant but doesn't recall being in the ER.  Minutes later, he doesn't recall being told that he was in the ER.   No incontinence.  No tongue biting.  No h/o SZ d/o.    Meds, vitals, and allergies reviewed.   ROS: See HPI.  Otherwise, noncontributory.  GEN: nad, alert and but not oriented- this is baseline HEENT: mucous membranes moist, PERRL NECK: supple w/o LA CV: sounds to be rrr.   PULM: ctab, no inc wob ABD: soft, +bs EXT: no edema SKIN: no acute rash CN 2-12 wnl B, S/S/DTR wnl x4

## 2014-06-29 NOTE — Patient Instructions (Signed)
Rosaria Ferries will call about your referral and the CT scan.  If another episode, then go to the ER.  Take care.  Glad to see you.

## 2014-06-29 NOTE — Telephone Encounter (Signed)
Pt was in the ed at [facility] long yesterday right arm and right leg quivering. He was unresponsive and son called ambulance.   Son is concerned about the tests that were done at the ed, and doesn't agree with them.  Son wants to speak with CMA or MD, before making follow up appt for father.  ED did testing for urinary, routine blood work, chest xray and everything was negative.  All tests came back normal.  Kidneys were normal.  Pt is at home, someone is staying with him.  Best number to call son back [phone].

## 2014-06-29 NOTE — Telephone Encounter (Signed)
I looked at the note and the labs.  Per report, he had an unremarkable exam and w/u at the ER.   If they'd like me to check him, I'm glad to see him.  Offer OV.  Thanks.

## 2014-06-30 DIAGNOSIS — R4182 Altered mental status, unspecified: Secondary | ICD-10-CM | POA: Insufficient documentation

## 2014-06-30 NOTE — Assessment & Plan Note (Signed)
Back at baseline, no clear cause seen.  Possible SZ.  D/w pt/family about options.  Reasonable to check CT head and get neuro eval.  If another episode, to ER.  Unlikely that it would have been TIA/CVA.  He is clearly improved.  Even if it were a CVA, he is already appropriately treated with current meds.   Okay for outpatient f/u.  >25 minutes spent in face to face time with patient, >50% spent in counselling or coordination of care.

## 2014-07-05 ENCOUNTER — Other Ambulatory Visit: Payer: Self-pay | Admitting: Family Medicine

## 2014-07-05 ENCOUNTER — Ambulatory Visit (INDEPENDENT_AMBULATORY_CARE_PROVIDER_SITE_OTHER)
Admission: RE | Admit: 2014-07-05 | Discharge: 2014-07-05 | Disposition: A | Discharge status: Home / Self Care | Payer: Medicare Other | Source: Ambulatory Visit | Attending: Family Medicine | Admitting: Family Medicine

## 2014-07-05 DIAGNOSIS — R4182 Altered mental status, unspecified: Secondary | ICD-10-CM

## 2014-07-05 DIAGNOSIS — R401 Stupor: Secondary | ICD-10-CM | POA: Diagnosis not present

## 2014-07-05 DIAGNOSIS — R251 Tremor, unspecified: Secondary | ICD-10-CM | POA: Diagnosis not present

## 2014-07-08 ENCOUNTER — Encounter: Payer: Self-pay | Admitting: Diagnostic Neuroimaging

## 2014-07-08 ENCOUNTER — Ambulatory Visit (INDEPENDENT_AMBULATORY_CARE_PROVIDER_SITE_OTHER): Payer: Medicare Other | Admitting: Diagnostic Neuroimaging

## 2014-07-08 VITALS — BP 108/72 | HR 67 | Ht 64.0 in | Wt 182.0 lb

## 2014-07-08 DIAGNOSIS — I6340 Cerebral infarction due to embolism of unspecified cerebral artery: Secondary | ICD-10-CM

## 2014-07-08 DIAGNOSIS — G40209 Localization-related (focal) (partial) symptomatic epilepsy and epileptic syndromes with complex partial seizures, not intractable, without status epilepticus: Secondary | ICD-10-CM

## 2014-07-08 DIAGNOSIS — I639 Cerebral infarction, unspecified: Secondary | ICD-10-CM

## 2014-07-08 MED ORDER — LEVETIRACETAM 250 MG PO TABS: 250.0000 mg | ORAL_TABLET | Freq: Two times a day (BID) | ORAL | Status: DC

## 2014-07-08 NOTE — Progress Notes (Signed)
[facility]  PATIENT: [name] DOB: [birth year]  REFERRING CLINICIAN: Orlinda Blalock HISTORY FROM: patient (son Phillip Heal, daughter [name]) REASON FOR VISIT: new consult    HISTORICAL  CHIEF COMPLAINT:  Chief Complaint  Patient presents with  . New Evaluation    altered mental status; possible stroke versus poss seizure , went to the ER    HISTORY OF PRESENT ILLNESS:   81 year old right-handed male with hypertension, hyperglycemia, heart disease, atrial fibrillation, skin cancer, stroke, dementia, here for evaluation of abnormal altered mental status, right-sided confusion, unresponsiveness episode on 06/28/14. Patient was at home when his son came to visit him around 7 in the morning. Patient stated he "didn't feel good" and said "I'm not make it". Soon after he became unresponsive. Son noted right arm and right leg convulsions. Some saliva was coming from his mouth. Patient's son called 19 who arrived within a few minutes. Shaking had stopped by the time. Vital signs apparently were stable. Patient was transported to the hospital for emergency room evaluation. Patient was apparently complaining of nausea and diarrhea although family does not recall this. Laboratory testing was unremarkable. Patient was treated with IV fluids and eventually his symptoms resolved within about one hour.  Since discharge patient followed up with PCP who ordered CT scan of the head which was unremarkable.  Patient is now back to baseline.   REVIEW OF SYSTEMS: Full 14 system review of systems performed and notable only for memory loss.  ALLERGIES: Allergies  Allergen Reactions  . Lorazepam [Lorazepam]   . Naproxen Sodium     REACTION: rash all over  . Niacin     REACTION: unspecified  . Ramipril     REACTION: syncope  . Sulfonamide Derivatives     REACTION: unspecified (no HCTZ allergy)  . Zolpidem Tartrate [Zolpidem Tartrate]     HOME MEDICATIONS: Outpatient  Prescriptions Prior to Visit  Medication Sig Dispense Refill  . allopurinol (ZYLOPRIM) 100 MG tablet take 1 tablet by mouth once daily 90 tablet 1  . ALPRAZolam (XANAX) 0.25 MG tablet take 1/2 to 1 tablet by mouth twice a day if needed for anxiety 60 tablet 2  . CRESTOR 20 MG tablet take 1 tablet by mouth once daily 30 tablet 5  . furosemide (LASIX) 40 MG tablet Take 0.5-1 tablets (20-40 mg total) by mouth daily.    . memantine (NAMENDA) 10 MG tablet Take 1 tablet (10 mg total) by mouth 2 (two) times daily. 60 tablet 12  . metoprolol (LOPRESSOR) 50 MG tablet Take 50 mg by mouth 2 (two) times daily.    . pantoprazole (PROTONIX) 40 MG tablet take 1 tablet by mouth twice a day 60 tablet 12  . sertraline (ZOLOFT) 50 MG tablet Take 50 mg by mouth at bedtime.    Alveda Reasons 15 MG TABS tablet take 1 tablet by mouth once daily 30 tablet 12   No facility-administered medications prior to visit.    PAST MEDICAL HISTORY: Past Medical History  Diagnosis Date  . Anxiety   . Hypertension   . Hyperlipidemia   . CAD (coronary artery disease)   . Dementia   . Gout, unspecified 2012    crystal analysis at urgent care  . AF (atrial fibrillation)   . 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 Cancer 02/25/11    Squamous Cell Carcinoma- Left Dorsum Hand  . Type II or unspecified type diabetes mellitus without mention of complication, not stated  as uncontrolled 06/18/2006  . Stroke 07/13/11    Acute left parietal subcortical and periventricular white matter infarction.  . Carotid artery occlusion     PAST SURGICAL HISTORY: Past Surgical History  Procedure Laterality Date  . Cholecystectomy    . Endarterectomy  1996    Bilateral (2 weeks apart)  . Skin cancer removal  06/2007    (? basal cell)  . 4 vessel cabg  06/97  . Mri brain  02/23/10    No acute abnormality Chronic Cerebral Ischemia Stable Ventr Prominence due to Volume loss  . Pseudoaneurysm repair  08/26/10    Right  carotid endarterectomy pseudoaneurysm  . Peg placement    . Carotid endarterectomy  03/11/02 and July 2013    FAMILY HISTORY: Family History  Problem Relation Age of Onset  . Colon cancer Sister   . Hypertension Brother   . Cancer Brother     intestine  . Brain cancer Brother   . Melanoma Sister   . Coronary artery disease Son     64, heart attack  . Diabetes Son   . Heart disease Son     After 29 yrs of age  . Hypertension Son   . Heart attack Son   . Other Mother     Intestinal  Blockage  . Other Father     Intestinal Blockage  . Diabetes Daughter   . Hypertension Daughter   . Hypertension Son     SOCIAL HISTORY:  History   Social History  . Marital Status: Widowed    Spouse Name: N/A  . Number of Children: 5  . Years of Education: 12   Occupational History  . Retired from [employer]    Social History Main Topics  . Smoking status: Former Smoker    Types: Cigarettes  . Smokeless tobacco: Never Used     Comment: Remote mild smoking history  . Alcohol Use: No  . Drug Use: No  . Sexual Activity: Not on file   Other Topics Concern  . Not on file   Social History Narrative   Son lives with patient, helps out at home.   Drinks caffeine occasionally   Drinks a fair amount of tea      PHYSICAL EXAM  Filed Vitals:   07/08/14 1052  BP: 108/72  Pulse: 67  Height: 5\' 4"  (1.626 m)  Weight: 182 lb (82.555 kg)    Body mass index is 31.22 kg/(m^2).   Visual Acuity Screening   Right eye Left eye Both eyes  Without correction: 20/70 20/70   With correction:       No flowsheet data found.  GENERAL EXAM: Patient is in no distress; well developed, nourished and groomed; neck is supple  CARDIOVASCULAR: DISTANT HEART SOUNDS, SYSTOLIC MURMUR; LEFT SUBCLAVIAN ARTERY; DECR LEFT RADIAL PULSE  NEUROLOGIC: MENTAL STATUS: awake, alert, oriented to "NOVEMBER, '99, FRIDAY, [place]", [place]; REGISTERS 2/3; RECALLS 0/3, DECR  ATTENTION, DECR FLUENCY, comprehension intact, naming intact, fund of knowledge appropriate CRANIAL NERVE: no papilledema on fundoscopic exam, pupils equal and reactive to light, visual fields full to confrontation, extraocular muscles intact, no nystagmus, facial sensation and strength symmetric, hearing intact, palate elevates symmetrically, uvula midline, shoulder shrug symmetric, tongue midline. MOTOR: normal bulk and tone, full strength in the BUE, BLE SENSORY: ABSENT VIB AT ANKLES/TOES COORDINATION: finger-nose-finger, fine finger movements normal REFLEXES: deep tendon reflexes TRACE and symmetric GAIT/STATION: narrow based gait     DIAGNOSTIC DATA (LABS, IMAGING, TESTING) -  I reviewed patient records, labs, notes, testing and imaging myself where available.  Lab Results  Component Value Date   WBC 8.5 06/28/2014   HGB 13.4 06/28/2014   HCT 40.8 06/28/2014   MCV 94.7 06/28/2014   PLT 208 06/28/2014      Component Value Date/Time   NA 140 06/28/2014 1231   K 4.7 06/28/2014 1231   CL 101 06/28/2014 1231   CO2 29 06/28/2014 1231   GLUCOSE 129* 06/28/2014 1231   BUN 16 06/28/2014 1231   CREATININE 1.48* 06/28/2014 1231   CALCIUM 9.0 06/28/2014 1231   PROT 7.0 06/28/2014 1231   ALBUMIN 3.6 06/28/2014 1231   AST 21 06/28/2014 1231   ALT 13* 06/28/2014 1231   ALKPHOS 81 06/28/2014 1231   BILITOT 0.8 06/28/2014 1231   GFRNONAA 41* 06/28/2014 1231   GFRAA 47* 06/28/2014 1231   Lab Results  Component Value Date   CHOL 136 01/13/2014   HDL 34.70* 01/13/2014   LDLCALC 64 11/25/2011   LDLDIRECT 71.1 01/13/2014   TRIG 258.0* 01/13/2014   CHOLHDL 4 01/13/2014   Lab Results  Component Value Date   HGBA1C 6.8* 01/13/2014   No results found for: VITAMINB12 Lab Results  Component Value Date   TSH 2.07 06/10/2011    07/05/14 CT head - No intracranial hemorrhage or CT evidence of large acute infarct. Small vessel disease type changes. Global atrophy and ventricular  prominence stable since prior exam. Vascular calcifications. [I reviewed images myself and agree with interpretation. -VRP]   07/14/11 MRI brain - Acute left parietal subcortical and periventricular white matter infarction. Moderate atrophy with slight chronic microvascular ischemic change. No acute hemorrhage. [I reviewed images myself and agree with interpretation. -VRP]   07/14/11 MRA head - Grossly patent carotid and basilar arteries. Due to patient motion, there is misregistration of the intracranial vessels, making this part of the study insufficient for diagnosis. [I reviewed images myself and agree with interpretation. -VRP]       ASSESSMENT AND PLAN  81 y.o. year old male here with dementia, hypertension, hyperkalemia, atrial fibrillation, stroke, here for evaluation of altered mental status episode, right-sided convulsions. Most likely represents partial onset seizure with secondary generalization, possibly triggered by dehydration or occult infection. Other possibility include a new TIA/stroke which triggered a seizure. Given patient's age, dementia, overall status, I do not recommend aggressive diagnostic or invasive procedures. He is already optimized on his stroke risk factors with medical management. I would recommend antiseizure medication to prevent future seizures. Also spent time reviewing advanced care planning and palliative care approach for patient, focusing on comfort and quality of life.  Dx:  Partial symptomatic epilepsy with complex partial seizures, not intractable, without status epilepticus  Embolic stroke    PLAN: - start LEV 250mg  BID - continue xarelto, metoprolol, crestor for stroke prevention  Meds ordered this encounter  Medications  . levETIRAcetam (KEPPRA) 250 MG tablet    Sig: Take 1 tablet (250 mg total) by mouth 2 (two) times daily.    Dispense:  60 tablet    Refill:  12   Return in about 3 months (around 10/08/2014).    Penni Bombard, MD  03/13/348, 09:38 PM Certified in Neurology, Neurophysiology and Neuroimaging  [facility] [address] [place], [place] [address] [phone]

## 2014-07-08 NOTE — Patient Instructions (Signed)
Continue current medications.  Start levetiracetam 250mg  twice a day.

## 2014-07-11 ENCOUNTER — Other Ambulatory Visit: Payer: Self-pay | Admitting: Family Medicine

## 2014-07-11 ENCOUNTER — Telehealth: Payer: Self-pay

## 2014-07-11 ENCOUNTER — Encounter: Payer: Self-pay | Admitting: Family

## 2014-07-11 DIAGNOSIS — L821 Other seborrheic keratosis: Secondary | ICD-10-CM | POA: Diagnosis not present

## 2014-07-11 DIAGNOSIS — E119 Type 2 diabetes mellitus without complications: Secondary | ICD-10-CM

## 2014-07-11 DIAGNOSIS — Z85828 Personal history of other malignant neoplasm of skin: Secondary | ICD-10-CM | POA: Diagnosis not present

## 2014-07-11 DIAGNOSIS — L570 Actinic keratosis: Secondary | ICD-10-CM | POA: Diagnosis not present

## 2014-07-11 DIAGNOSIS — L812 Freckles: Secondary | ICD-10-CM | POA: Diagnosis not present

## 2014-07-11 NOTE — Telephone Encounter (Signed)
Sched carotid bilat at 9:30. Sched NP at 10:30. Spoke to pt's wife to inform pt of appt.

## 2014-07-11 NOTE — Telephone Encounter (Signed)
I would try it given the recent event he had.  He can always stop it later if not tolerated.  I would defer to neuro in this case- I think they had a good idea.

## 2014-07-11 NOTE — Telephone Encounter (Signed)
[name] pts son left v/m; pt was prescribed meds by [facility] and [name] concerned about pt starting med,generic Keppra until pts PCP is OK with pt starting med due to possible side effects; drowsiness, weakness and dizziness; can possibly make pt more confused.[name] request cb.

## 2014-07-11 NOTE — Telephone Encounter (Signed)
Will defer to PCP

## 2014-07-11 NOTE — Telephone Encounter (Signed)
Phone call from pt's son.  Reported that his father was evaluated recently "for episode of right arm/ leg quivering, and loss of consciousness."  Reported he was evaluated in the ER @ [facility] approx. 2 wks. ago, and referred to a Neurologist.  Stated he saw the Neurologist on 07/08/14, and advised to start an anti-seizure medication.  Son is requesting to move the November 2016 appt. to an earlier date, to make sure his episode was not related to his carotid arteries.  [name] denied any sign of facial droop, speech difficulty, unilateral weakness of lower extremities.  Reported pt. has dementia, with short-term memory loss, but does communicate verbally.  Advised pt's son, will call back to reschedule his carotid surveillance appt. to an earlier date.

## 2014-07-12 ENCOUNTER — Encounter: Payer: Self-pay | Admitting: Family

## 2014-07-12 ENCOUNTER — Ambulatory Visit (INDEPENDENT_AMBULATORY_CARE_PROVIDER_SITE_OTHER): Payer: Medicare Other | Admitting: Family

## 2014-07-12 ENCOUNTER — Ambulatory Visit (HOSPITAL_COMMUNITY)
Admission: RE | Admit: 2014-07-12 | Discharge: 2014-07-12 | Disposition: A | Discharge status: Home / Self Care | Payer: Medicare Other | Source: Ambulatory Visit | Attending: Family | Admitting: Family

## 2014-07-12 VITALS — BP 102/62 | HR 57 | Resp 14 | Ht 63.5 in | Wt 179.0 lb

## 2014-07-12 DIAGNOSIS — Z9889 Other specified postprocedural states: Secondary | ICD-10-CM | POA: Diagnosis not present

## 2014-07-12 DIAGNOSIS — I708 Atherosclerosis of other arteries: Secondary | ICD-10-CM

## 2014-07-12 DIAGNOSIS — Z87891 Personal history of nicotine dependence: Secondary | ICD-10-CM

## 2014-07-12 DIAGNOSIS — Z48812 Encounter for surgical aftercare following surgery on the circulatory system: Secondary | ICD-10-CM

## 2014-07-12 DIAGNOSIS — I771 Stricture of artery: Secondary | ICD-10-CM

## 2014-07-12 DIAGNOSIS — I6523 Occlusion and stenosis of bilateral carotid arteries: Secondary | ICD-10-CM | POA: Diagnosis not present

## 2014-07-12 NOTE — Telephone Encounter (Signed)
Patient's son Dominica Severin) advised.

## 2014-07-12 NOTE — Progress Notes (Signed)
Established Carotid Patient   History of Present Illness  [name] is a 81 y.o. male  patient of Dr. Kellie Simmering who is s/p right carotid endarterectomy in 1986 with revision in 2004, left carotid endarterectomy in1986, right carotid pseudoaneurysm repair 08/31/2010 at endarterectomy site with replacement using vein patch from left leg.  The patient has Alzheimer's Disease and his daughter is answering questions, states he has 24/7 care since 2014.  The patient's daughter denies history of TIA or stroke symptoms for her father, specifically the patient's daughter denies patient history of amaurosis fugax or monocular blindness, denies a history unilateral of facial drooping, denies a history of hemiplegia, and denies a history of receptive or expressive aphasia, but daughter states that an MRI of his head showed several TIA's.  Daughter denies that her father has any non healing wounds, he ambulates without difficulty.  The patient denies tingling, numbness, weakness, or pain in either hand or arm, denies dizziness with raising hands above head.  He returns today due to son and daughter concern that he may have had a recent stroke. Son reports that pt had a seizure Jul 01 2014 as determined by medical evaluation at [facility] after an episode of slurred speech, right side of body twitching, non responsive but breathing for about 45 minutes, he then resumed to baseline.  Pt has seen a neurologist since this event, neurologist encounter note reviewed.  Pt Diabetic: Yes, diet controlled Pt smoker: former smoker, quit in 2005  Pt meds include: Statin : Yes ASA: No Other anticoagulants/antiplatelets: atrial fib, takes Jennye Moccasin  Past Medical History  Diagnosis Date  . Anxiety   . Hypertension   . Hyperlipidemia   . CAD (coronary artery disease)   . Dementia   . Gout, unspecified 2012    crystal analysis at urgent care  . AF (atrial fibrillation)   . Dysphagia     laryngeal nerve palsy  after pseudoaneurysm repair  . Cancer 02/25/11    Squamous Cell Carcinoma- Left Dorsum Hand  . Type II or unspecified type diabetes mellitus without mention of complication, not stated as uncontrolled 06/18/2006  . Stroke 07/13/11    Acute left parietal subcortical and periventricular white matter infarction.  . Carotid artery occlusion     Social History History  Substance Use Topics  . Smoking status: Former Smoker    Types: Cigarettes  . Smokeless tobacco: Never Used     Comment: Remote mild smoking history  . Alcohol Use: No    Family History Family History  Problem Relation Age of Onset  . Colon cancer Sister   . Hypertension Brother   . Cancer Brother     intestine  . Brain cancer Brother   . Melanoma Sister   . Coronary artery disease Son     96, heart attack  . Diabetes Son   . Heart disease Son     After 69 yrs of age  . Hypertension Son   . Heart attack Son   . Other Mother     Intestinal  Blockage  . Other Father     Intestinal Blockage  . Diabetes Daughter   . Hypertension Daughter   . Hypertension Son     Surgical History Past Surgical History  Procedure Laterality Date  . Cholecystectomy    . Endarterectomy  1996    Bilateral (2 weeks apart)  . Skin cancer removal  06/2007    (? basal cell)  . 4 vessel cabg  06/97  .  Mri brain  02/23/10    No acute abnormality Chronic Cerebral Ischemia Stable Ventr Prominence due to Volume loss  . Pseudoaneurysm repair  08/26/10    Right carotid endarterectomy pseudoaneurysm  . Peg placement    . Carotid endarterectomy  03/11/02 and July 2013    Allergies  Allergen Reactions  . Lorazepam [Lorazepam]   . Naproxen Sodium     REACTION: rash all over  . Niacin     REACTION: unspecified  . Ramipril     REACTION: syncope  . Sulfonamide Derivatives     REACTION: unspecified (no HCTZ allergy)  . Zolpidem Tartrate [Zolpidem Tartrate]     Current Outpatient Prescriptions  Medication Sig Dispense Refill  .  allopurinol (ZYLOPRIM) 100 MG tablet take 1 tablet by mouth once daily 90 tablet 1  . ALPRAZolam (XANAX) 0.25 MG tablet take 1/2 to 1 tablet by mouth twice a day if needed for anxiety 60 tablet 2  . CRESTOR 20 MG tablet take 1 tablet by mouth once daily 30 tablet 5  . furosemide (LASIX) 40 MG tablet Take 0.5-1 tablets (20-40 mg total) by mouth daily.    Marland Kitchen levETIRAcetam (KEPPRA) 250 MG tablet Take 1 tablet (250 mg total) by mouth 2 (two) times daily. 60 tablet 12  . memantine (NAMENDA) 10 MG tablet Take 1 tablet (10 mg total) by mouth 2 (two) times daily. 60 tablet 12  . metoprolol (LOPRESSOR) 50 MG tablet Take 50 mg by mouth 2 (two) times daily.    . pantoprazole (PROTONIX) 40 MG tablet take 1 tablet by mouth twice a day 60 tablet 12  . sertraline (ZOLOFT) 50 MG tablet Take 50 mg by mouth at bedtime.    Alveda Reasons 15 MG TABS tablet take 1 tablet by mouth once daily 30 tablet 12   No current facility-administered medications for this visit.    Review of Systems : See HPI for pertinent positives and negatives.  Physical Examination  Filed Vitals:   07/12/14 1052 07/12/14 1056  BP: 117/66 102/62  Pulse: 57 57  Resp:  14  Height:  5' 3.5" (1.613 m)  Weight:  179 lb (81.194 kg)  SpO2:  94%   Body mass index is 31.21 kg/(m^2).  General: WDWN obese male in NAD GAIT: normal Eyes: PERRLA Pulmonary: Non-labored, CTAB, Negative Rales, Negative rhonchi, & Negative wheezing.  Cardiac: regular Rhythm,no detected murmur.  VASCULAR EXAM Carotid Bruits Right Left   Negative Negative   Aorta is not palpable. Radial pulses: right is 2+ palpable, left is not palpable, left ulnar is not palpable, left brachial is faintly palpable.      LE Pulses Right Left   POPLITEAL not palpable  not palpable    Gastrointestinal: soft, nontender, BS  WNL, no r/g, no palpable masses.  Musculoskeletal: Negative muscle atrophy/wasting. M/S 5/5 throughout, Extremities without ischemic changes.  Neurologic: A&O X 2; looking to son and daughter for cues and information, Speech is normal, he follows commands appropriately, CN 2-12 intact except is hard of hearing, Motor exam as listed above.          Non-Invasive Vascular Imaging CAROTID DUPLEX 07/12/2014   CEREBROVASCULAR DUPLEX EVALUATION    INDICATION: Carotid endarterectomy     PREVIOUS INTERVENTION(S): Bilateral carotid endarterectomies in 1986; Right carotid endarterectomy in 2004; Right carotid pseudoaneurysm repair on 08/31/10    DUPLEX EXAM:     RIGHT  LEFT  Peak Systolic Velocities (cm/s) End Diastolic Velocities (cm/s) Plaque LOCATION Peak Systolic Velocities (cm/s) End  Diastolic Velocities (cm/s) Plaque  82 9 HT CCA PROXIMAL 98 13   168 22 HT CCA MID 81 15   115 9  CCA DISTAL 128 16 HT  84 5  ECA 143 0   38 8 HM ICA PROXIMAL 67 15 HM  56 13  ICA MID 79 15   42 10  ICA DISTAL 57 16     Not Calculated ICA / CCA Ratio (PSV) Not Calculated  Antegrade Vertebral Flow Bidirectional  889 Brachial Systolic Pressure (mmHg) 169  Multiphasic (subclavian artery) Brachial Artery Waveforms Monophasic (subclavian artery)    Plaque Morphology:  HM = Homogeneous, HT = Heterogeneous, CP = Calcific Plaque, SP = Smooth Plaque, IP = Irregular Plaque     ADDITIONAL FINDINGS: . No significant stenosis of the bilateral external or left common carotid arteries. . Velocity of 426cm/s in the left proximal subclavian artery with mostly retrograde, bidirectional flow in the left vertebral artery.    IMPRESSION: 1. Patent bilateral carotid endarterectomy sites with Doppler velocities suggestive of 1-49% bilateral internal carotid artery stenoses. 2. Doppler velocities suggest a greater than 50% right mid common carotid artery stenosis. 3. Evidence of left subclavian artery disease noted, as  described above.    Compared to the previous exam:  No significant change noted when compared to the previous exam on 11/29/13.      Assessment: MYCAH FORMICA is a 81 y.o. male who is s/p bilateral carotid endarterectomies in 1986; Right carotid endarterectomy in 2004; Right carotid pseudoaneurysm repair on 08/31/10 Discussed pt's son and daughter concerns with Dr. Kellie Simmering.  Today's carotid Duplex result is essentially unchanged from 11/29/13 and does not point to an etiology for his Jul 01, 2014 event as described in HPI. Today's carotid Duplex suggests patent bilateral carotid endarterectomy sites with minimal bilateral ICA stenoses. Greater than 50% right mid common carotid artery stenosis, but he had right lateralizing symptoms which would suggest left hemispheric brain activity, not right. Evidence of left subclavian artery disease, but he has no claudication symptoms in his left upper extremity.   Face to face time with patient and family was 25 minutes. Over 50% of this time was spent on counseling and coordination of care.   Plan: Follow-up in 1 year with Carotid Duplex, may cancel October 2016 visit.   I discussed in depth with the patient the nature of atherosclerosis, and emphasized the importance of maximal medical management including strict control of blood pressure, blood glucose, and lipid levels, obtaining regular exercise, and continued cessation of smoking.  The patient is aware that without maximal medical management the underlying atherosclerotic disease process will progress, limiting the benefit of any interventions. The patient was given information about stroke prevention and what symptoms should prompt the patient to seek immediate medical care. Thank you for allowing [place] to participate in this patient's care.  Clemon Chambers, RN, MSN, FNP-C [facility] Office: [phone]  Clinic Physician: Kellie Simmering  07/12/2014 11:11 AM

## 2014-07-12 NOTE — Patient Instructions (Signed)
Stroke Prevention Some medical conditions and behaviors are associated with an increased chance of having a stroke. You may prevent a stroke by making healthy choices and managing medical conditions. HOW CAN I REDUCE MY RISK OF HAVING A STROKE?   Stay physically active. Get at least 30 minutes of activity on most or all days.  Do not smoke. It may also be helpful to avoid exposure to secondhand smoke.  Limit alcohol use. Moderate alcohol use is considered to be:  No more than 2 drinks per day for men.  No more than 1 drink per day for nonpregnant women.  Eat healthy foods. This involves:  Eating 5 or more servings of fruits and vegetables a day.  Making dietary changes that address high blood pressure (hypertension), high cholesterol, diabetes, or obesity.  Manage your cholesterol levels.  Making food choices that are high in fiber and low in saturated fat, trans fat, and cholesterol may control cholesterol levels.  Take any prescribed medicines to control cholesterol as directed by your health care provider.  Manage your diabetes.  Controlling your carbohydrate and sugar intake is recommended to manage diabetes.  Take any prescribed medicines to control diabetes as directed by your health care provider.  Control your hypertension.  Making food choices that are low in salt (sodium), saturated fat, trans fat, and cholesterol is recommended to manage hypertension.  Take any prescribed medicines to control hypertension as directed by your health care provider.  Maintain a healthy weight.  Reducing calorie intake and making food choices that are low in sodium, saturated fat, trans fat, and cholesterol are recommended to manage weight.  Stop drug abuse.  Avoid taking birth control pills.  Talk to your health care provider about the risks of taking birth control pills if you are over 35 years old, smoke, get migraines, or have ever had a blood clot.  Get evaluated for sleep  disorders (sleep apnea).  Talk to your health care provider about getting a sleep evaluation if you snore a lot or have excessive sleepiness.  Take medicines only as directed by your health care provider.  For some people, aspirin or blood thinners (anticoagulants) are helpful in reducing the risk of forming abnormal blood clots that can lead to stroke. If you have the irregular heart rhythm of atrial fibrillation, you should be on a blood thinner unless there is a good reason you cannot take them.  Understand all your medicine instructions.  Make sure that other conditions (such as anemia or atherosclerosis) are addressed. SEEK IMMEDIATE MEDICAL CARE IF:   You have sudden weakness or numbness of the face, arm, or leg, especially on one side of the body.  Your face or eyelid droops to one side.  You have sudden confusion.  You have trouble speaking (aphasia) or understanding.  You have sudden trouble seeing in one or both eyes.  You have sudden trouble walking.  You have dizziness.  You have a loss of balance or coordination.  You have a sudden, severe headache with no known cause.  You have new chest pain or an irregular heartbeat. Any of these symptoms may represent a serious problem that is an emergency. Do not wait to see if the symptoms will go away. Get medical help at once. Call your local emergency services (911 in [place] Do not drive yourself to the hospital. Document Released: 02/29/2004 Document Revised: 06/07/2013 Document Reviewed: 07/24/2012 [employer] Patient Information 2015 [employer]. This information is not intended to replace advice given   to you by your health care provider. Make sure you discuss any questions you have with your health care provider.  

## 2014-07-13 ENCOUNTER — Other Ambulatory Visit: Payer: Self-pay | Admitting: Family Medicine

## 2014-07-13 NOTE — Addendum Note (Signed)
Addended by: Dorthula Rue L on: 07/13/2014 04:44 PM   Modules accepted: Orders

## 2014-07-22 ENCOUNTER — Other Ambulatory Visit (INDEPENDENT_AMBULATORY_CARE_PROVIDER_SITE_OTHER): Payer: Medicare Other

## 2014-07-22 DIAGNOSIS — E119 Type 2 diabetes mellitus without complications: Secondary | ICD-10-CM | POA: Diagnosis not present

## 2014-07-22 LAB — HEMOGLOBIN A1C: Hgb A1c MFr Bld: 6.4 % (ref 4.6–6.5)

## 2014-07-25 ENCOUNTER — Encounter: Payer: Self-pay | Admitting: Family Medicine

## 2014-07-25 ENCOUNTER — Ambulatory Visit (INDEPENDENT_AMBULATORY_CARE_PROVIDER_SITE_OTHER): Payer: Medicare Other | Admitting: Family Medicine

## 2014-07-25 VITALS — BP 110/62 | HR 68 | Temp 98.4°F | Wt 182.8 lb

## 2014-07-25 DIAGNOSIS — R4182 Altered mental status, unspecified: Secondary | ICD-10-CM

## 2014-07-25 DIAGNOSIS — Z23 Encounter for immunization: Secondary | ICD-10-CM | POA: Diagnosis not present

## 2014-07-25 DIAGNOSIS — E119 Type 2 diabetes mellitus without complications: Secondary | ICD-10-CM | POA: Diagnosis not present

## 2014-07-25 NOTE — Patient Instructions (Signed)
No change in meds.  Sugar test is good.  No meds needed for sugar.  Recheck in about 6 months at a physical.  Notify me if concerns.  Glad to see you. Take care.

## 2014-07-25 NOTE — Progress Notes (Signed)
Pre visit review using our clinic review tool, if applicable. No additional management support is needed unless otherwise documented below in the visit note.  DM2 f/u.  No meds.  A1c improved. He feels well. Labs d/w pt and son.    Had f/u with neuro, not on meds at this point but no more events.  Family wanted to avoid potential side effects.  He has some sleepiness at baseline, ie late to rise in the AM and family didn't want to make patient more sleepy.  D/w pt about rationale for keppra and future plans.    Meds, vitals, and allergies reviewed.   ROS: See HPI.  Otherwise, noncontributory.  GEN: nad, alert and but not oriented- this is baseline HEENT: mucous membranes moist, PERRL NECK: supple w/o LA CV: rrr PULM: ctab, no inc wob ABD: soft, +bs EXT: no edema SKIN: no acute rash

## 2014-07-26 NOTE — Assessment & Plan Note (Signed)
No meds. A1c improved. He feels well. Labs d/w pt and son.  Recheck in about 6 months.

## 2014-07-26 NOTE — Assessment & Plan Note (Signed)
Likely/possible SZ, back to baseline now.  Had f/u with neuro, not on meds at this point but no more events. Family wanted to avoid potential side effects. He has some sleepiness at baseline, ie late to rise in the AM and family didn't want to make patient more sleepy. D/w pt about rationale for keppra and future plans.  Would be more inclined to treat with a second episode.  Risk/benefit discussed.  Continue as is.  He has supervision.  Update me as needed.  >25 minutes spent in face to face time with patient, >50% spent in counselling or coordination of care.

## 2014-08-04 ENCOUNTER — Other Ambulatory Visit: Payer: Self-pay | Admitting: Family Medicine

## 2014-08-15 ENCOUNTER — Ambulatory Visit (INDEPENDENT_AMBULATORY_CARE_PROVIDER_SITE_OTHER): Payer: Medicare Other | Admitting: Podiatry

## 2014-08-15 ENCOUNTER — Encounter: Payer: Self-pay | Admitting: Podiatry

## 2014-08-15 DIAGNOSIS — B351 Tinea unguium: Secondary | ICD-10-CM

## 2014-08-15 DIAGNOSIS — M79606 Pain in leg, unspecified: Secondary | ICD-10-CM | POA: Diagnosis not present

## 2014-08-16 NOTE — Progress Notes (Signed)
Subjective:     Patient ID: [name], male   DOB: 06-24-1924, 81 y.o.   MRN: [id number]  HPI patient presents with thick yellow brittle nailbeds 1-5 both feet that are painful   Review of Systems     Objective:   Physical Exam Neurovascular status intact with yellow brittle painful nailbeds 1-5 both feet    Assessment:     Mycotic nail infections with pain 1-5 both feet    Plan:     Debride painful nailbeds 1-5 both feet with no iatrogenic bleeding noted

## 2014-08-23 ENCOUNTER — Other Ambulatory Visit: Payer: Self-pay | Admitting: Family Medicine

## 2014-09-04 ENCOUNTER — Other Ambulatory Visit: Payer: Self-pay | Admitting: Family Medicine

## 2014-09-05 NOTE — Telephone Encounter (Signed)
Electronic refill request. Last Filled:    30 tablet 12      On 08/10/13  Please advise.

## 2014-09-05 NOTE — Telephone Encounter (Signed)
Sent. Thanks.   

## 2014-09-29 ENCOUNTER — Other Ambulatory Visit: Payer: Self-pay | Admitting: Family Medicine

## 2014-09-30 ENCOUNTER — Telehealth: Payer: Self-pay

## 2014-09-30 MED ORDER — SERTRALINE HCL 50 MG PO TABS: ORAL_TABLET | ORAL | Status: DC

## 2014-09-30 NOTE — Telephone Encounter (Signed)
Sent. Thanks.   

## 2014-09-30 NOTE — Telephone Encounter (Signed)
Received an electronic refill request for Zoloft. Last seen 07/25/14. Ok to refill?

## 2014-10-04 ENCOUNTER — Encounter: Payer: Self-pay | Admitting: Internal Medicine

## 2014-10-11 ENCOUNTER — Ambulatory Visit: Payer: Medicare Other | Admitting: Diagnostic Neuroimaging

## 2014-11-15 ENCOUNTER — Ambulatory Visit: Payer: Medicare Other | Admitting: Podiatry

## 2014-11-22 ENCOUNTER — Ambulatory Visit (INDEPENDENT_AMBULATORY_CARE_PROVIDER_SITE_OTHER): Payer: Medicare Other | Admitting: Podiatry

## 2014-11-22 DIAGNOSIS — M79673 Pain in unspecified foot: Secondary | ICD-10-CM

## 2014-11-22 DIAGNOSIS — B351 Tinea unguium: Secondary | ICD-10-CM | POA: Diagnosis not present

## 2014-11-22 NOTE — Progress Notes (Signed)
Patient ID: [name], male   DOB: May 31, 1924, 81 y.o.   MRN: [id number] Complaint:  Visit Type: Patient returns to my office for continued preventative foot care services. Complaint: Patient states" my nails have grown long and thick and become painful to walk and wear shoes" Patient has been diagnosed with DM with vascular  complications. The patient presents for preventative foot care services. No changes to ROS  Podiatric Exam: Vascular: dorsalis pedis and posterior tibial pulses are not palpable bilateral. Capillary return is slow.. Cold feet noted. Sensorium: Normal Semmes Weinstein monofilament test. Normal tactile sensation bilaterally. Nail Exam: Pt has thick disfigured discolored nails with subungual debris noted bilateral entire nail hallux through fifth toenails Ulcer Exam: There is no evidence of ulcer or pre-ulcerative changes or infection. Orthopedic Exam: Muscle tone and strength are WNL. No limitations in general ROM. No crepitus or effusions noted. Foot type and digits show no abnormalities. Bony prominences are unremarkable. Skin: No Porokeratosis. No infection or ulcers  Diagnosis:  Onychomycosis, , Pain in right toe, pain in left toes  Treatment & Plan Procedures and Treatment: Consent by patient was obtained for treatment procedures. The patient understood the discussion of treatment and procedures well. All questions were answered thoroughly reviewed. Debridement of mycotic and hypertrophic toenails, 1 through 5 bilateral and clearing of subungual debris. No ulceration, no infection noted.  Return Visit-Office Procedure: Patient instructed to return to the office for a follow up visit 3 months for continued evaluation and treatment.

## 2014-12-12 ENCOUNTER — Telehealth: Payer: Self-pay | Admitting: Family Medicine

## 2014-12-13 ENCOUNTER — Other Ambulatory Visit (HOSPITAL_COMMUNITY): Payer: Medicare Other

## 2014-12-13 ENCOUNTER — Ambulatory Visit: Payer: Medicare Other | Admitting: Family

## 2014-12-13 NOTE — Telephone Encounter (Signed)
Received refill request electronically Last refill 06/15/14 #60/2 Last office visit 07/25/14 Is it okay to refill?

## 2014-12-14 NOTE — Telephone Encounter (Signed)
Please call in.  Thanks.  Due for recheck in the next month or two.

## 2014-12-14 NOTE — Telephone Encounter (Signed)
Rx called to pharmacy as instructed. Please call and schedule appointment as instructed.

## 2014-12-15 NOTE — Telephone Encounter (Signed)
Appointment 11/17 Pt son aware Please close

## 2014-12-22 ENCOUNTER — Ambulatory Visit (INDEPENDENT_AMBULATORY_CARE_PROVIDER_SITE_OTHER): Payer: Medicare Other

## 2014-12-22 DIAGNOSIS — Z23 Encounter for immunization: Secondary | ICD-10-CM | POA: Diagnosis not present

## 2015-01-04 ENCOUNTER — Other Ambulatory Visit: Payer: Self-pay | Admitting: Family Medicine

## 2015-01-09 ENCOUNTER — Other Ambulatory Visit: Payer: Self-pay | Admitting: Family Medicine

## 2015-01-09 DIAGNOSIS — L570 Actinic keratosis: Secondary | ICD-10-CM | POA: Diagnosis not present

## 2015-01-09 DIAGNOSIS — Z85828 Personal history of other malignant neoplasm of skin: Secondary | ICD-10-CM | POA: Diagnosis not present

## 2015-01-09 DIAGNOSIS — L821 Other seborrheic keratosis: Secondary | ICD-10-CM | POA: Diagnosis not present

## 2015-01-18 ENCOUNTER — Ambulatory Visit (INDEPENDENT_AMBULATORY_CARE_PROVIDER_SITE_OTHER): Payer: Medicare Other | Admitting: Family Medicine

## 2015-01-18 ENCOUNTER — Encounter: Payer: Self-pay | Admitting: Family Medicine

## 2015-01-18 VITALS — BP 108/64 | HR 68 | Temp 98.3°F | Wt 177.8 lb

## 2015-01-18 DIAGNOSIS — Z8639 Personal history of other endocrine, nutritional and metabolic disease: Secondary | ICD-10-CM | POA: Diagnosis not present

## 2015-01-18 DIAGNOSIS — E119 Type 2 diabetes mellitus without complications: Secondary | ICD-10-CM | POA: Diagnosis not present

## 2015-01-18 DIAGNOSIS — F0390 Unspecified dementia without behavioral disturbance: Secondary | ICD-10-CM

## 2015-01-18 DIAGNOSIS — I4891 Unspecified atrial fibrillation: Secondary | ICD-10-CM

## 2015-01-18 DIAGNOSIS — R739 Hyperglycemia, unspecified: Secondary | ICD-10-CM | POA: Diagnosis not present

## 2015-01-18 DIAGNOSIS — E785 Hyperlipidemia, unspecified: Secondary | ICD-10-CM | POA: Diagnosis not present

## 2015-01-18 DIAGNOSIS — Z8739 Personal history of other diseases of the musculoskeletal system and connective tissue: Secondary | ICD-10-CM

## 2015-01-18 DIAGNOSIS — M109 Gout, unspecified: Secondary | ICD-10-CM

## 2015-01-18 MED ORDER — SERTRALINE HCL 50 MG PO TABS: 50.0000 mg | ORAL_TABLET | Freq: Every day | ORAL | Status: AC

## 2015-01-18 MED ORDER — MEMANTINE HCL 10 MG PO TABS: 10.0000 mg | ORAL_TABLET | Freq: Two times a day (BID) | ORAL | Status: AC

## 2015-01-18 MED ORDER — PANTOPRAZOLE SODIUM 40 MG PO TBEC: 40.0000 mg | DELAYED_RELEASE_TABLET | Freq: Two times a day (BID) | ORAL | Status: AC

## 2015-01-18 MED ORDER — FUROSEMIDE 40 MG PO TABS: 40.0000 mg | ORAL_TABLET | Freq: Every day | ORAL | Status: AC

## 2015-01-18 NOTE — Patient Instructions (Signed)
Go to the lab on the way out.  We'll contact you with your lab report. Don't change your meds for now.  Recheck in about 6 months at a physical.  Call about an eye exam in the meantime.  Leg exercises- stand up from a chair at a table and then sit back down- repeat about 5 times.  Take care.  Glad to see you.

## 2015-01-18 NOTE — Progress Notes (Signed)
Pre visit review using our clinic review tool, if applicable. No additional management support is needed unless otherwise documented below in the visit note.  Routine f/u.    Gout- no ADE on allopurinol.  No recent flares.  Compliant.  Due for labs.    DM2/hyperglycemia.  Due for labs.  No sx.  No meds at this point.    Elevated Cholesterol: Using medications without problems:yes Muscle aches: no Diet compliance:yes Exercise:as tolerated  Memory loss continues, no ADE on med.  Has sig help/supervision from family, constant help.    AF.  Still anticoagulated.  D/w pt/son about fall prevention and exercises for BLE.  No bleeding.  Due for labs.    Meds, vitals, and allergies reviewed.   ROS: See HPI.  Otherwise, noncontributory.  GEN: nad, alert and talkative but he doesn't recall recent events.  HEENT: mucous membranes moist NECK: supple w/o LA CV: IRR, not tachy PULM: ctab, no inc wob ABD: soft, +bs EXT: no edema  Diabetic foot exam: Normal inspection ecxept for dry skin on B feet No skin breakdown No calluses  Normal DP pulses Normal sensation to light touch and monofilament Nails thickened

## 2015-01-19 LAB — LIPID PANEL
CHOL/HDL RATIO: 4
CHOLESTEROL: 130 mg/dL (ref 0–200)
HDL: 33.3 mg/dL — ABNORMAL LOW (ref >39.00)
NonHDL: 96.44
Triglycerides: 227 mg/dL — ABNORMAL HIGH (ref 0.0–149.0)
VLDL: 45.4 mg/dL — ABNORMAL HIGH (ref 0.0–40.0)

## 2015-01-19 LAB — HEMOGLOBIN A1C: Hgb A1c MFr Bld: 6.5 % (ref 4.6–6.5)

## 2015-01-19 LAB — BASIC METABOLIC PANEL
BUN: 20 mg/dL (ref 6–23)
CO2: 34 mEq/L — ABNORMAL HIGH (ref 19–32)
CREATININE: 1.71 mg/dL — AB (ref 0.40–1.50)
Calcium: 9.2 mg/dL (ref 8.4–10.5)
Chloride: 101 mEq/L (ref 96–112)
GFR: 40.36 mL/min — AB (ref >60.00)
Glucose, Bld: 123 mg/dL — ABNORMAL HIGH (ref 70–99)
Potassium: 3.8 mEq/L (ref 3.5–5.1)
Sodium: 139 mEq/L (ref 135–145)

## 2015-01-19 LAB — LDL CHOLESTEROL, DIRECT: LDL DIRECT: 69 mg/dL

## 2015-01-19 LAB — URIC ACID: URIC ACID, SERUM: 8 mg/dL — AB (ref 4.0–7.8)

## 2015-01-19 NOTE — Assessment & Plan Note (Signed)
At this point, would continue anticoagulation.  No falls.  See notes on labs.

## 2015-01-19 NOTE — Assessment & Plan Note (Signed)
See notes on labs.  Off meds currently. >25 minutes spent in face to face time with patient, >50% spent in counselling or coordination of care.

## 2015-01-19 NOTE — Assessment & Plan Note (Signed)
See notes on labs.  Continue allopurinol.

## 2015-01-19 NOTE — Assessment & Plan Note (Signed)
Continue as is.  Has sig family support and patient needs it.  Would continue as is.  D/w them about fall risk, balance/strength exercises and observation.

## 2015-01-25 ENCOUNTER — Other Ambulatory Visit: Payer: Self-pay | Admitting: Family Medicine

## 2015-01-25 DIAGNOSIS — R7989 Other specified abnormal findings of blood chemistry: Secondary | ICD-10-CM

## 2015-01-31 ENCOUNTER — Other Ambulatory Visit (INDEPENDENT_AMBULATORY_CARE_PROVIDER_SITE_OTHER): Payer: Medicare Other

## 2015-01-31 DIAGNOSIS — R748 Abnormal levels of other serum enzymes: Secondary | ICD-10-CM

## 2015-01-31 DIAGNOSIS — R7989 Other specified abnormal findings of blood chemistry: Secondary | ICD-10-CM

## 2015-01-31 LAB — BASIC METABOLIC PANEL
BUN: 21 mg/dL (ref 6–23)
CO2: 35 mEq/L — ABNORMAL HIGH (ref 19–32)
Calcium: 9.6 mg/dL (ref 8.4–10.5)
Chloride: 97 mEq/L (ref 96–112)
Creatinine, Ser: 1.64 mg/dL — ABNORMAL HIGH (ref 0.40–1.50)
GFR: 42.35 mL/min — AB (ref >60.00)
GLUCOSE: 145 mg/dL — AB (ref 70–99)
POTASSIUM: 3.5 meq/L (ref 3.5–5.1)
Sodium: 140 mEq/L (ref 135–145)

## 2015-02-28 ENCOUNTER — Encounter: Payer: Self-pay | Admitting: Podiatry

## 2015-02-28 ENCOUNTER — Ambulatory Visit (INDEPENDENT_AMBULATORY_CARE_PROVIDER_SITE_OTHER): Payer: Medicare Other | Admitting: Podiatry

## 2015-02-28 DIAGNOSIS — M79673 Pain in unspecified foot: Secondary | ICD-10-CM

## 2015-02-28 DIAGNOSIS — B351 Tinea unguium: Secondary | ICD-10-CM

## 2015-02-28 NOTE — Progress Notes (Signed)
Patient ID: KU LEISNER, male   DOB: 08-Mar-1924, 82 y.o.   MRN: [id number] Complaint:  Visit Type: Patient returns to my office for continued preventative foot care services. Complaint: Patient states" my nails have grown long and thick and become painful to walk and wear shoes" Patient has been diagnosed with DM with vascular  complications. The patient presents for preventative foot care services. No changes to ROS  Podiatric Exam: Vascular: dorsalis pedis and posterior tibial pulses are not palpable bilateral. Capillary return is slow.. Cold feet noted. Sensorium: Normal Semmes Weinstein monofilament test. Normal tactile sensation bilaterally. Nail Exam: Pt has thick disfigured discolored nails with subungual debris noted bilateral entire nail hallux through fifth toenails Ulcer Exam: There is no evidence of ulcer or pre-ulcerative changes or infection. Orthopedic Exam: Muscle tone and strength are WNL. No limitations in general ROM. No crepitus or effusions noted. Foot type and digits show no abnormalities. Bony prominences are unremarkable. Skin: No Porokeratosis. No infection or ulcers  Diagnosis:  Onychomycosis, , Pain in right toe, pain in left toes  Treatment & Plan Procedures and Treatment: Consent by patient was obtained for treatment procedures. The patient understood the discussion of treatment and procedures well. All questions were answered thoroughly reviewed. Debridement of mycotic and hypertrophic toenails, 1 through 5 bilateral and clearing of subungual debris. No ulceration, no infection noted.  Return Visit-Office Procedure: Patient instructed to return to the office for a follow up visit 3 months for continued evaluation and treatment.   Gardiner Barefoot DPM

## 2015-05-22 ENCOUNTER — Emergency Department (HOSPITAL_COMMUNITY)
Admission: EM | Admit: 2015-05-22 | Discharge: 2015-05-22 | Disposition: A | Discharge status: Home / Self Care | Payer: Medicare Other | Attending: Emergency Medicine | Admitting: Emergency Medicine

## 2015-05-22 ENCOUNTER — Emergency Department (HOSPITAL_COMMUNITY): Payer: Medicare Other

## 2015-05-22 ENCOUNTER — Telehealth: Payer: Self-pay | Admitting: Family Medicine

## 2015-05-22 ENCOUNTER — Encounter (HOSPITAL_COMMUNITY): Payer: Self-pay | Admitting: Emergency Medicine

## 2015-05-22 DIAGNOSIS — E785 Hyperlipidemia, unspecified: Secondary | ICD-10-CM | POA: Insufficient documentation

## 2015-05-22 DIAGNOSIS — R519 Headache, unspecified: Secondary | ICD-10-CM

## 2015-05-22 DIAGNOSIS — R51 Headache: Secondary | ICD-10-CM | POA: Insufficient documentation

## 2015-05-22 DIAGNOSIS — I10 Essential (primary) hypertension: Secondary | ICD-10-CM | POA: Insufficient documentation

## 2015-05-22 DIAGNOSIS — G309 Alzheimer's disease, unspecified: Secondary | ICD-10-CM | POA: Diagnosis not present

## 2015-05-22 DIAGNOSIS — E119 Type 2 diabetes mellitus without complications: Secondary | ICD-10-CM | POA: Insufficient documentation

## 2015-05-22 DIAGNOSIS — M109 Gout, unspecified: Secondary | ICD-10-CM | POA: Insufficient documentation

## 2015-05-22 DIAGNOSIS — I4891 Unspecified atrial fibrillation: Secondary | ICD-10-CM | POA: Insufficient documentation

## 2015-05-22 DIAGNOSIS — Z8673 Personal history of transient ischemic attack (TIA), and cerebral infarction without residual deficits: Secondary | ICD-10-CM | POA: Diagnosis not present

## 2015-05-22 DIAGNOSIS — I2510 Atherosclerotic heart disease of native coronary artery without angina pectoris: Secondary | ICD-10-CM | POA: Insufficient documentation

## 2015-05-22 DIAGNOSIS — Z79899 Other long term (current) drug therapy: Secondary | ICD-10-CM | POA: Diagnosis not present

## 2015-05-22 DIAGNOSIS — Z87891 Personal history of nicotine dependence: Secondary | ICD-10-CM | POA: Diagnosis not present

## 2015-05-22 DIAGNOSIS — F0280 Dementia in other diseases classified elsewhere without behavioral disturbance: Secondary | ICD-10-CM | POA: Insufficient documentation

## 2015-05-22 DIAGNOSIS — R410 Disorientation, unspecified: Secondary | ICD-10-CM | POA: Insufficient documentation

## 2015-05-22 DIAGNOSIS — R110 Nausea: Secondary | ICD-10-CM | POA: Diagnosis not present

## 2015-05-22 DIAGNOSIS — Z85828 Personal history of other malignant neoplasm of skin: Secondary | ICD-10-CM | POA: Diagnosis not present

## 2015-05-22 HISTORY — DX: Dementia in other diseases classified elsewhere, unspecified severity, without behavioral disturbance, psychotic disturbance, mood disturbance, and anxiety: F02.80

## 2015-05-22 HISTORY — DX: Alzheimer's disease, unspecified: G30.9

## 2015-05-22 LAB — COMPREHENSIVE METABOLIC PANEL
ALBUMIN: 3.7 g/dL (ref 3.5–5.0)
ALT: 14 U/L — ABNORMAL LOW (ref 17–63)
ANION GAP: 11 (ref 5–15)
AST: 21 U/L (ref 15–41)
Alkaline Phosphatase: 82 U/L (ref 38–126)
BILIRUBIN TOTAL: 1.2 mg/dL (ref 0.3–1.2)
BUN: 13 mg/dL (ref 6–20)
CO2: 28 mmol/L (ref 22–32)
Calcium: 9.1 mg/dL (ref 8.9–10.3)
Chloride: 100 mmol/L — ABNORMAL LOW (ref 101–111)
Creatinine, Ser: 1.46 mg/dL — ABNORMAL HIGH (ref 0.61–1.24)
GFR calc non Af Amer: 41 mL/min — ABNORMAL LOW (ref >60)
GFR, EST AFRICAN AMERICAN: 48 mL/min — AB (ref >60)
GLUCOSE: 129 mg/dL — AB (ref 65–99)
POTASSIUM: 3.6 mmol/L (ref 3.5–5.1)
Sodium: 139 mmol/L (ref 135–145)
TOTAL PROTEIN: 7 g/dL (ref 6.5–8.1)

## 2015-05-22 LAB — CBC
HEMATOCRIT: 42.9 % (ref 39.0–52.0)
HEMOGLOBIN: 14 g/dL (ref 13.0–17.0)
MCH: 30.8 pg (ref 26.0–34.0)
MCHC: 32.6 g/dL (ref 30.0–36.0)
MCV: 94.3 fL (ref 78.0–100.0)
Platelets: 211 10*3/uL (ref 150–400)
RBC: 4.55 MIL/uL (ref 4.22–5.81)
RDW: 14.8 % (ref 11.5–15.5)
WBC: 9.4 10*3/uL (ref 4.0–10.5)

## 2015-05-22 MED ORDER — ACETAMINOPHEN 500 MG PO TABS
1000.0000 mg | ORAL_TABLET | Freq: Once | ORAL | Status: AC
  Administered 2015-05-22: 1000 mg via ORAL
  Filled 2015-05-22: qty 2

## 2015-05-22 MED ORDER — ONDANSETRON 4 MG PO TBDP
4.0000 mg | ORAL_TABLET | Freq: Once | ORAL | Status: AC | PRN
  Administered 2015-05-22: 4 mg via ORAL
  Filled 2015-05-22: qty 1

## 2015-05-22 NOTE — ED Notes (Signed)
Dr.Lockwood at bedside  

## 2015-05-22 NOTE — ED Provider Notes (Signed)
CSN: [id number]     Arrival date & time 05/22/15  1336 History   First MD Initiated Contact with Patient 05/22/15 1823     Chief Complaint  Patient presents with  . Headache  . Nausea     (Consider location/radiation/quality/duration/timing/severity/associated sxs/prior Treatment) HPI Patient presents with family members to assist with the history of present illness. Patient has a history of dementia. He states that he has pain focally in the occipital area. Pain is been present previously but over the past 3 or 4 days has been more severe, more persistent, now associated with nausea. No new weakness anywhere, syncope, vision changes. Family has not provided any medication for pain relief, as the patient has multiple medical issues, and they voiced concern of possible medication interaction. Patient has known history of A. fib, stroke.  Past Medical History  Diagnosis Date  . Anxiety   . Hypertension   . Hyperlipidemia   . CAD (coronary artery disease)   . Dementia   . Gout, unspecified 2012    crystal analysis at urgent care  . AF (atrial fibrillation) ([place])   . 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 Cancer ([place]) 02/25/11    Squamous Cell Carcinoma- Left Dorsum Hand  . Type II or unspecified type diabetes mellitus without mention of complication, not stated as uncontrolled 06/18/2006  . Stroke ([place]) 07/13/11    Acute left parietal subcortical and periventricular white matter infarction.  . Carotid artery occlusion   . Alzheimer's dementia    Past Surgical History  Procedure Laterality Date  . Cholecystectomy    . Endarterectomy  1996    Bilateral (2 weeks apart)  . Skin cancer removal  06/2007    (? basal cell)  . 4 vessel cabg  06/97  . Mri brain  02/23/10    No acute abnormality Chronic Cerebral Ischemia Stable Ventr Prominence due to Volume loss  . Pseudoaneurysm repair  08/26/10    Right carotid endarterectomy pseudoaneurysm  . Peg  placement    . Carotid endarterectomy  03/11/02 and July 2013   Family History  Problem Relation Age of Onset  . Colon cancer Sister   . Hypertension Brother   . Cancer Brother     intestine  . Brain cancer Brother   . Melanoma Sister   . Coronary artery disease Son     28, heart attack  . Diabetes Son   . Heart disease Son     After 41 yrs of age  . Hypertension Son   . Heart attack Son   . Other Mother     Intestinal  Blockage  . Other Father     Intestinal Blockage  . Diabetes Daughter   . Hypertension Daughter   . Hypertension Son    Social History  Substance Use Topics  . Smoking status: Former Smoker    Types: Cigarettes  . Smokeless tobacco: Never Used     Comment: Remote mild smoking history  . Alcohol Use: No    Review of Systems  Constitutional:       Per HPI, otherwise negative  HENT:       Per HPI, otherwise negative  Respiratory:       Per HPI, otherwise negative  Cardiovascular:       Per HPI, otherwise negative  Gastrointestinal: Positive for nausea. Negative for vomiting.  Endocrine:       Negative aside from HPI  Genitourinary:  Neg aside from HPI   Musculoskeletal:       Per HPI, otherwise negative  Skin: Negative.   Neurological: Positive for headaches. Negative for syncope.  Psychiatric/Behavioral: Positive for confusion.      Allergies  Lorazepam; Naproxen sodium; Niacin; Ramipril; Sulfonamide derivatives; and Zolpidem tartrate  Home Medications   Prior to Admission medications   Medication Sig Start Date End Date Taking? Authorizing Provider  allopurinol (ZYLOPRIM) 100 MG tablet take 1 tablet by mouth once daily 01/05/15   Tonia Ghent, MD  ALPRAZolam Duanne Moron) 0.25 MG tablet take 1/2 to 1 tablet by mouth twice a day if needed for anxiety 12/14/14   Tonia Ghent, MD  furosemide (LASIX) 40 MG tablet Take 1 tablet (40 mg total) by mouth daily. 01/18/15   Tonia Ghent, MD  memantine (NAMENDA) 10 MG tablet Take 1 tablet  (10 mg total) by mouth 2 (two) times daily. 01/18/15   Tonia Ghent, MD  metoprolol (LOPRESSOR) 50 MG tablet Take 50 mg by mouth 2 (two) times daily.    Historical Provider, MD  pantoprazole (PROTONIX) 40 MG tablet Take 1 tablet (40 mg total) by mouth 2 (two) times daily. 01/18/15   Tonia Ghent, MD  rosuvastatin (CRESTOR) 20 MG tablet take 1 tablet by mouth once daily 01/10/15   Tonia Ghent, MD  sertraline (ZOLOFT) 50 MG tablet Take 1 tablet (50 mg total) by mouth daily. 01/18/15   Tonia Ghent, MD  XARELTO 15 MG TABS tablet take 1 tablet by mouth once daily 09/05/14   Tonia Ghent, MD   BP 127/73 mmHg  Pulse 65  Temp(Src) 97.6 F (36.4 C) (Oral)  Resp 23  Ht 5\' 5"  (1.651 m)  SpO2 95% Physical Exam  Constitutional: He appears well-developed. No distress.  HENT:  Head: Normocephalic and atraumatic.    Eyes: Conjunctivae and EOM are normal.  Cardiovascular: Normal rate and regular rhythm.   Pulmonary/Chest: Effort normal. No stridor. No respiratory distress.  Abdominal: He exhibits no distension.  Musculoskeletal: He exhibits no edema.  Neurological: He is alert. He displays no atrophy and no tremor. No cranial nerve deficit or sensory deficit. He exhibits normal muscle tone. He displays no seizure activity. Coordination normal.  Skin: Skin is warm and dry.  Psychiatric: He is slowed. Cognition and memory are impaired.  Nursing note and vitals reviewed.   ED Course  Procedures (including critical care time) Labs Review Labs Reviewed  COMPREHENSIVE METABOLIC PANEL - Abnormal; Notable for the following:    Chloride 100 (*)    Glucose, Bld 129 (*)    Creatinine, Ser 1.46 (*)    ALT 14 (*)    GFR calc non Af Amer 41 (*)    GFR calc Af Amer 48 (*)    All other components within normal limits  CBC  URINALYSIS, ROUTINE W REFLEX MICROSCOPIC (NOT AT [facility])    Imaging Review Ct Head Wo Contrast  05/22/2015  CLINICAL DATA:  Occipital headache beginning today. Any  coagulated. Dementia. EXAM: CT HEAD WITHOUT CONTRAST TECHNIQUE: Contiguous axial images were obtained from the base of the skull through the vertex without intravenous contrast. COMPARISON:  07/05/2014 FINDINGS: The brain shows generalized atrophy, most pronounced in the temporal lobes. 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 Few other scattered small vessel cerebellar infarctions. Mild small vessel change of the cerebral hemispheric white matter. No sign of acute infarction, mass lesion, hemorrhage, hydrocephalus or extra-axial collection. No calvarial  abnormality. No evidence of acute sinus disease. There is atherosclerotic calcification of the major vessels at the base of the brain. IMPRESSION: No acute finding. Generalized atrophy, most pronounced affecting the temporal lobes. 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 Electronically Signed   By: Nelson Chimes M.D.   On: 05/22/2015 19:48   I have personally reviewed and evaluated these images and lab results as part of my medical decision-making.  9:33 PM Pain has resolved. I had a lengthy conversation with the patient's family members about possible causes for his episodic pain given his history of dementia, microvascular disease. We discussed the need to try medication at home, including cryotherapy, Tylenol, possibly ibuprofen and follow up with neurology.   MDM  Patient with dementia presents with ongoing episodic headaches. Notably, the patient has not taken any medication for relief over the past 2 days. Here, the patient received Tylenol, with resolution of his symptoms. Evaluation here is largely reassuring, with unremarkable vital signs, unremarkable head CT. Patient does have notable atherosclerotic disease, intracranial disease, is currently taking Xarelto, seemingly appropriately medically managed for prevention of additional stroke. Patient will follow up with neurology.  Carmin Muskrat, MD 05/22/15 2134

## 2015-05-22 NOTE — Telephone Encounter (Signed)
Await ER notes. Thanks.  

## 2015-05-22 NOTE — Telephone Encounter (Signed)
[facility]  Patient Name: [name]  DOB: [birth year]    Initial Comment Caller states, father has neck / or back of head pains for the last 3 days. He is now nauseated wants an appt today if possible.    Nurse Assessment  Nurse: Wayne Sever, RN, Tillie Rung Date/Time (Eastern Time): 05/22/2015 12:50:38 PM  Confirm and document reason for call. If symptomatic, describe symptoms. You must click the next button to save text entered. ---Caller states he is at work but he states his father is having pain in his head and feeling queasy on this stomach. Son is not with his father, but states his sister is and can be reached at [phone], her name is [name]. Was able to get her on the phone and she states he is having pain in the back of the head and they are really sharp. They started about an hour and a half ago.  Has the patient traveled out of the country within the last 30 days? ---Not Applicable  Does the patient have any new or worsening symptoms? ---Yes  Will a triage be completed? ---Yes  Related visit to physician within the last 2 weeks? ---No  Does the PT have any chronic conditions? (i.e. diabetes, asthma, etc.) ---Yes  List chronic conditions. ---Bypass, Stents, Alzheimer's,  Is this a behavioral health or substance abuse call? ---No     Guidelines    Guideline Title Affirmed Question Affirmed Notes  Headache [1] SEVERE headache (e.g., excruciating) AND [2] "worst headache" of life    Final Disposition User   Go to ED Now (or PCP triage) Wayne Sever, RN, [facility] - ED   Disagree/Comply: Comply

## 2015-05-22 NOTE — Discharge Instructions (Signed)
As discussed, today's evaluation has been largely reassuring. For additional control of your headache, please consider using ice packs, Tylenol, ibuprofen.  Return here for concerning changes in your condition. Otherwise, please be sure to follow-up with your neurologist.

## 2015-05-22 NOTE — Telephone Encounter (Signed)
Per chart review tab pt is at [facility]. 

## 2015-05-25 ENCOUNTER — Telehealth: Payer: Self-pay | Admitting: Family Medicine

## 2015-05-25 NOTE — Telephone Encounter (Signed)
[facility] Patient Name: LAREN ESTRIDGE DOB: 10/29/1924 Initial Comment Caller states his father has off an on bad headaches on the back-- he was taken to the ER- CAT scan was done- sx have been happening since last FRI Nurse Assessment Nurse: Dimas Chyle, RN, Dellis Filbert Date/Time Eilene Ghazi Time): 05/25/2015 1:33:12 PM Confirm and document reason for call. If symptomatic, describe symptoms. You must click the next button to save text entered. ---Caller states his father has off an on bad headaches on the back-- he was taken to the ER- CAT scan was done- sx have been happening since last FRI Has the patient traveled out of the country within the last 30 days? ---No Does the patient have any new or worsening symptoms? ---Yes Will a triage be completed? ---Yes Related visit to physician within the last 2 weeks? ---Yes Does the PT have any chronic conditions? (i.e. diabetes, asthma, etc.) ---Yes List chronic conditions. ---HTN, TIA Is this a behavioral health or substance abuse call? ---No Guidelines Guideline Title Affirmed Question Affirmed Notes Headache [1] MODERATE headache (e.g., interferes with normal activities) AND [2] present > 24 hours AND [3] unexplained (Exceptions: analgesics not tried, typical migraine, or headache part of viral illness) Final Disposition User See Physician within 24 Hours Dimas Chyle, RN, [employer] Referrals REFERRED TO PCP OFFICE

## 2015-05-25 NOTE — Telephone Encounter (Signed)
Pt has appt to see Dr Damita Dunnings 05/30/15 at [place].

## 2015-05-26 NOTE — Telephone Encounter (Signed)
Pt walked in thinking he had appt today. Pt had appt 05/30/15 and appt changed to 05/29/15 at 10:45 with Dr Damita Dunnings. Since 05/19/15 pt had severe pain in back of head on and off with dizziness. Now just slight pain in back of head and no dizziness; pt cannot put # on pain level. Last 2 nights pt has been anxious and not sleeping. Pt wants MRI of neck or head done. Dr Damita Dunnings suggest keep appt on 05/29/15 and try alternating heat and ice to back of head;taking regular tylenol 2 tabs tid. Pt and pts daughter voiced understanding and will see Dr Damita Dunnings on Mon. FYI to Dr Damita Dunnings.

## 2015-05-26 NOTE — Telephone Encounter (Signed)
Noted. Thanks.

## 2015-05-26 NOTE — Addendum Note (Signed)
Addended by: Helene Shoe on: 05/26/2015 11:21 AM   Modules accepted: Medications

## 2015-05-29 ENCOUNTER — Ambulatory Visit (INDEPENDENT_AMBULATORY_CARE_PROVIDER_SITE_OTHER): Payer: Medicare Other | Admitting: Family Medicine

## 2015-05-29 ENCOUNTER — Ambulatory Visit (INDEPENDENT_AMBULATORY_CARE_PROVIDER_SITE_OTHER)
Admission: RE | Admit: 2015-05-29 | Discharge: 2015-05-29 | Disposition: A | Discharge status: Home / Self Care | Payer: Medicare Other | Source: Ambulatory Visit | Attending: Family Medicine | Admitting: Family Medicine

## 2015-05-29 ENCOUNTER — Encounter: Payer: Self-pay | Admitting: Family Medicine

## 2015-05-29 VITALS — BP 106/70 | HR 66 | Temp 97.7°F | Wt 179.0 lb

## 2015-05-29 DIAGNOSIS — F411 Generalized anxiety disorder: Secondary | ICD-10-CM

## 2015-05-29 DIAGNOSIS — M542 Cervicalgia: Secondary | ICD-10-CM | POA: Diagnosis not present

## 2015-05-29 MED ORDER — ALPRAZOLAM 0.25 MG PO TABS: ORAL_TABLET | ORAL | Status: DC

## 2015-05-29 NOTE — Progress Notes (Signed)
Pre visit review using our clinic review tool, if applicable. No additional management support is needed unless otherwise documented below in the visit note.  HA and some neck pain.  Has been complaining of pain on the back of the neck, not anterior.  No falls.  No rash.  Sleeping in a bed, pillow is still in good shape.  Sleeping on 1 pillow.  Pain started about 7-10 days.  Some pain with ROM, intermittent pain.  Was taking scheduled tylenol over the weekend, no sig change in the amount of complaints re: neck pain. No fevers.  No vomiting.  No diarrhea.  No rash.    He had been more fidgety and not sleeping as weel, but that is better with a higher dose of xanax, changed from 1/2 to 1 tab of 0.25mg  BID now.  No ADE on med.  She does have some longerstanding gait changes with a widening of his gait.  D/w pt's family about fall precautions, ie can or walker use.  It appears that this predates the BZD use.  Unfortunately his BZD use is likely needed at this point to help control anxiety and family has worked to use the least amount of med possible to help with his sx.    Meds, vitals, and allergies reviewed.   ROS: See HPI.  Otherwise, noncontributory.  Ncat Tm wnl B Nasal and OP exam wnl Mmm Neck supple, no midline pain, B posterior paraspinal muscles slightly ttp w/o bruising or rash Normal ROM neck o/w.  Not stiff.  No LA Normal S/S in the ext x4 with slightly wide based but symmetric gait.

## 2015-05-29 NOTE — Assessment & Plan Note (Signed)
Continue SSRI and lowest dose of BZD possible.  D/w them about fall risk and using a cane or walker, as this seems to be the best option for now.  He has supervision.

## 2015-05-29 NOTE — Assessment & Plan Note (Signed)
>  25 minutes spent in face to face time with patient, >50% spent in counselling or coordination of care It looks like he has muscles tightness posteriorly, possible C spine OA, with plain films pending.  I would use a heating pad or an ice bag for now. Keep taking tylenol.  See notes on imaging.   Okay for outpatient f/u.  I don't think his neck sx are related to the gait changes- that looks more age related/deconditioning and using a cane or walker would be reasonable.

## 2015-05-29 NOTE — Patient Instructions (Signed)
It looks like you have tightened muscles on the back of your neck, possibly from arthritis.   I would use a heating pad or an ice bag for now.  Keep taking tylenol.  Go to the lab on the way out.  We'll contact you with your xray report. I would start using a walker or cane to make it easier to walk.  Take care.  Glad to see you.

## 2015-05-30 ENCOUNTER — Encounter: Payer: Self-pay | Admitting: Podiatry

## 2015-05-30 ENCOUNTER — Institutional Professional Consult (permissible substitution): Payer: Self-pay | Admitting: Family Medicine

## 2015-05-30 ENCOUNTER — Ambulatory Visit (INDEPENDENT_AMBULATORY_CARE_PROVIDER_SITE_OTHER): Payer: Medicare Other | Admitting: Podiatry

## 2015-05-30 DIAGNOSIS — M79673 Pain in unspecified foot: Secondary | ICD-10-CM | POA: Diagnosis not present

## 2015-05-30 DIAGNOSIS — B351 Tinea unguium: Secondary | ICD-10-CM | POA: Diagnosis not present

## 2015-05-30 NOTE — Progress Notes (Signed)
Patient ID: [name], male   DOB: [birth year], 82 y.o.   MRN: [id number] Complaint:  Visit Type: Patient returns to my office for continued preventative foot care services. Complaint: Patient states" my nails have grown long and thick and become painful to walk and wear shoes" . The patient presents for preventative foot care services. No changes to ROS  Podiatric Exam: Vascular: dorsalis pedis and posterior tibial pulses are not palpable bilateral. Capillary return is slow.. Cold feet noted. Sensorium: Normal Semmes Weinstein monofilament test. Normal tactile sensation bilaterally. Nail Exam: Pt has thick disfigured discolored nails with subungual debris noted bilateral entire nail hallux through fifth toenails Ulcer Exam: There is no evidence of ulcer or pre-ulcerative changes or infection. Orthopedic Exam: Muscle tone and strength are WNL. No limitations in general ROM. No crepitus or effusions noted. Foot type and digits show no abnormalities. Bony prominences are unremarkable. Skin: No Porokeratosis. No infection or ulcers  Diagnosis:  Onychomycosis, , Pain in right toe, pain in left toes   Gardiner Barefoot DPM  Treatment & Plan Procedures and Treatment: Consent by patient was obtained for treatment procedures. The patient understood the discussion of treatment and procedures well. All questions were answered thoroughly reviewed. Debridement of mycotic and hypertrophic toenails, 1 through 5 bilateral and clearing of subungual debris. No ulceration, no infection noted.  Return Visit-Office Procedure: Patient instructed to return to the office for a follow up visit 3 months for continued evaluation and treatment.   Gardiner Barefoot DPM

## 2015-06-01 ENCOUNTER — Other Ambulatory Visit: Payer: Self-pay | Admitting: Family Medicine

## 2015-06-01 MED ORDER — ALPRAZOLAM 0.25 MG PO TABS: ORAL_TABLET | ORAL | Status: AC

## 2015-06-01 NOTE — Progress Notes (Signed)
Please call in xanax rx  Thanks 

## 2015-06-02 NOTE — Progress Notes (Signed)
Xanax called into [facility].

## 2015-06-10 ENCOUNTER — Encounter (HOSPITAL_COMMUNITY): Payer: Self-pay | Admitting: *Deleted

## 2015-06-10 ENCOUNTER — Inpatient Hospital Stay (HOSPITAL_COMMUNITY)
Admission: EM | Admit: 2015-06-10 | Discharge: 2015-07-06 | DRG: 871 | Disposition: E | Payer: Medicare Other | Attending: Internal Medicine | Admitting: Internal Medicine

## 2015-06-10 ENCOUNTER — Inpatient Hospital Stay (HOSPITAL_COMMUNITY): Payer: Medicare Other

## 2015-06-10 ENCOUNTER — Emergency Department (HOSPITAL_COMMUNITY): Payer: Medicare Other

## 2015-06-10 DIAGNOSIS — Z79899 Other long term (current) drug therapy: Secondary | ICD-10-CM | POA: Diagnosis not present

## 2015-06-10 DIAGNOSIS — R0600 Dyspnea, unspecified: Secondary | ICD-10-CM | POA: Diagnosis present

## 2015-06-10 DIAGNOSIS — R778 Other specified abnormalities of plasma proteins: Secondary | ICD-10-CM | POA: Diagnosis present

## 2015-06-10 DIAGNOSIS — Z8673 Personal history of transient ischemic attack (TIA), and cerebral infarction without residual deficits: Secondary | ICD-10-CM | POA: Diagnosis not present

## 2015-06-10 DIAGNOSIS — I110 Hypertensive heart disease with heart failure: Secondary | ICD-10-CM | POA: Diagnosis not present

## 2015-06-10 DIAGNOSIS — Z7901 Long term (current) use of anticoagulants: Secondary | ICD-10-CM | POA: Diagnosis not present

## 2015-06-10 DIAGNOSIS — R0602 Shortness of breath: Secondary | ICD-10-CM | POA: Diagnosis not present

## 2015-06-10 DIAGNOSIS — Z886 Allergy status to analgesic agent status: Secondary | ICD-10-CM | POA: Diagnosis not present

## 2015-06-10 DIAGNOSIS — I129 Hypertensive chronic kidney disease with stage 1 through stage 4 chronic kidney disease, or unspecified chronic kidney disease: Secondary | ICD-10-CM | POA: Diagnosis not present

## 2015-06-10 DIAGNOSIS — R7989 Other specified abnormal findings of blood chemistry: Secondary | ICD-10-CM

## 2015-06-10 DIAGNOSIS — Z882 Allergy status to sulfonamides status: Secondary | ICD-10-CM

## 2015-06-10 DIAGNOSIS — Z951 Presence of aortocoronary bypass graft: Secondary | ICD-10-CM | POA: Diagnosis not present

## 2015-06-10 DIAGNOSIS — Z888 Allergy status to other drugs, medicaments and biological substances status: Secondary | ICD-10-CM | POA: Diagnosis not present

## 2015-06-10 DIAGNOSIS — I272 Other secondary pulmonary hypertension: Secondary | ICD-10-CM | POA: Diagnosis not present

## 2015-06-10 DIAGNOSIS — N183 Chronic kidney disease, stage 3 (moderate): Secondary | ICD-10-CM | POA: Diagnosis present

## 2015-06-10 DIAGNOSIS — I482 Chronic atrial fibrillation, unspecified: Secondary | ICD-10-CM | POA: Diagnosis present

## 2015-06-10 DIAGNOSIS — R062 Wheezing: Secondary | ICD-10-CM

## 2015-06-10 DIAGNOSIS — I2510 Atherosclerotic heart disease of native coronary artery without angina pectoris: Secondary | ICD-10-CM | POA: Diagnosis present

## 2015-06-10 DIAGNOSIS — R441 Visual hallucinations: Secondary | ICD-10-CM | POA: Diagnosis present

## 2015-06-10 DIAGNOSIS — G9341 Metabolic encephalopathy: Secondary | ICD-10-CM | POA: Diagnosis not present

## 2015-06-10 DIAGNOSIS — R6521 Severe sepsis with septic shock: Secondary | ICD-10-CM | POA: Diagnosis not present

## 2015-06-10 DIAGNOSIS — I4891 Unspecified atrial fibrillation: Secondary | ICD-10-CM | POA: Diagnosis not present

## 2015-06-10 DIAGNOSIS — A419 Sepsis, unspecified organism: Secondary | ICD-10-CM | POA: Diagnosis not present

## 2015-06-10 DIAGNOSIS — F0390 Unspecified dementia without behavioral disturbance: Secondary | ICD-10-CM | POA: Diagnosis not present

## 2015-06-10 DIAGNOSIS — N179 Acute kidney failure, unspecified: Secondary | ICD-10-CM | POA: Diagnosis present

## 2015-06-10 DIAGNOSIS — E1165 Type 2 diabetes mellitus with hyperglycemia: Secondary | ICD-10-CM | POA: Diagnosis not present

## 2015-06-10 DIAGNOSIS — E785 Hyperlipidemia, unspecified: Secondary | ICD-10-CM | POA: Diagnosis not present

## 2015-06-10 DIAGNOSIS — G309 Alzheimer's disease, unspecified: Secondary | ICD-10-CM | POA: Diagnosis present

## 2015-06-10 DIAGNOSIS — J189 Pneumonia, unspecified organism: Secondary | ICD-10-CM | POA: Diagnosis not present

## 2015-06-10 DIAGNOSIS — Z87891 Personal history of nicotine dependence: Secondary | ICD-10-CM | POA: Diagnosis not present

## 2015-06-10 DIAGNOSIS — R627 Adult failure to thrive: Secondary | ICD-10-CM | POA: Diagnosis present

## 2015-06-10 DIAGNOSIS — E1122 Type 2 diabetes mellitus with diabetic chronic kidney disease: Secondary | ICD-10-CM | POA: Diagnosis not present

## 2015-06-10 DIAGNOSIS — R1310 Dysphagia, unspecified: Secondary | ICD-10-CM | POA: Diagnosis not present

## 2015-06-10 DIAGNOSIS — J9600 Acute respiratory failure, unspecified whether with hypoxia or hypercapnia: Secondary | ICD-10-CM | POA: Diagnosis present

## 2015-06-10 DIAGNOSIS — Z66 Do not resuscitate: Secondary | ICD-10-CM | POA: Diagnosis present

## 2015-06-10 DIAGNOSIS — I214 Non-ST elevation (NSTEMI) myocardial infarction: Secondary | ICD-10-CM

## 2015-06-10 DIAGNOSIS — J9601 Acute respiratory failure with hypoxia: Secondary | ICD-10-CM | POA: Diagnosis not present

## 2015-06-10 DIAGNOSIS — I959 Hypotension, unspecified: Secondary | ICD-10-CM

## 2015-06-10 DIAGNOSIS — I248 Other forms of acute ischemic heart disease: Secondary | ICD-10-CM | POA: Diagnosis not present

## 2015-06-10 DIAGNOSIS — E872 Acidosis: Secondary | ICD-10-CM | POA: Diagnosis present

## 2015-06-10 DIAGNOSIS — Z8249 Family history of ischemic heart disease and other diseases of the circulatory system: Secondary | ICD-10-CM

## 2015-06-10 DIAGNOSIS — Z515 Encounter for palliative care: Secondary | ICD-10-CM | POA: Diagnosis present

## 2015-06-10 DIAGNOSIS — F0280 Dementia in other diseases classified elsewhere without behavioral disturbance: Secondary | ICD-10-CM | POA: Diagnosis present

## 2015-06-10 DIAGNOSIS — E119 Type 2 diabetes mellitus without complications: Secondary | ICD-10-CM

## 2015-06-10 DIAGNOSIS — R918 Other nonspecific abnormal finding of lung field: Secondary | ICD-10-CM | POA: Diagnosis not present

## 2015-06-10 LAB — CBC WITH DIFFERENTIAL/PLATELET
BASOS ABS: 0 10*3/uL (ref 0.0–0.1)
Basophils Relative: 0 %
EOS ABS: 0.1 10*3/uL (ref 0.0–0.7)
EOS PCT: 1 %
HCT: 38.6 % — ABNORMAL LOW (ref 39.0–52.0)
Hemoglobin: 12.5 g/dL — ABNORMAL LOW (ref 13.0–17.0)
Lymphocytes Relative: 13 %
Lymphs Abs: 1.7 10*3/uL (ref 0.7–4.0)
MCH: 30.8 pg (ref 26.0–34.0)
MCHC: 32.4 g/dL (ref 30.0–36.0)
MCV: 95.1 fL (ref 78.0–100.0)
MONO ABS: 1.3 10*3/uL — AB (ref 0.1–1.0)
Monocytes Relative: 10 %
Neutro Abs: 10.1 10*3/uL — ABNORMAL HIGH (ref 1.7–7.7)
Neutrophils Relative %: 76 %
PLATELETS: 226 10*3/uL (ref 150–400)
RBC: 4.06 MIL/uL — AB (ref 4.22–5.81)
RDW: 15.4 % (ref 11.5–15.5)
WBC: 13.1 10*3/uL — AB (ref 4.0–10.5)

## 2015-06-10 LAB — CBC
HCT: 37.6 % — ABNORMAL LOW (ref 39.0–52.0)
Hemoglobin: 12.5 g/dL — ABNORMAL LOW (ref 13.0–17.0)
MCH: 32.3 pg (ref 26.0–34.0)
MCHC: 33.2 g/dL (ref 30.0–36.0)
MCV: 97.2 fL (ref 78.0–100.0)
PLATELETS: 166 10*3/uL (ref 150–400)
RBC: 3.87 MIL/uL — ABNORMAL LOW (ref 4.22–5.81)
RDW: 15.5 % (ref 11.5–15.5)
WBC: 11.6 10*3/uL — AB (ref 4.0–10.5)

## 2015-06-10 LAB — GLUCOSE, CAPILLARY
GLUCOSE-CAPILLARY: 131 mg/dL — AB (ref 65–99)
Glucose-Capillary: 116 mg/dL — ABNORMAL HIGH (ref 65–99)

## 2015-06-10 LAB — BASIC METABOLIC PANEL
Anion gap: 14 (ref 5–15)
BUN: 23 mg/dL — AB (ref 6–20)
CO2: 24 mmol/L (ref 22–32)
CREATININE: 1.85 mg/dL — AB (ref 0.61–1.24)
Calcium: 9.1 mg/dL (ref 8.9–10.3)
Chloride: 97 mmol/L — ABNORMAL LOW (ref 101–111)
GFR calc Af Amer: 36 mL/min — ABNORMAL LOW (ref >60)
GFR, EST NON AFRICAN AMERICAN: 31 mL/min — AB (ref >60)
GLUCOSE: 200 mg/dL — AB (ref 65–99)
Potassium: 3.8 mmol/L (ref 3.5–5.1)
SODIUM: 135 mmol/L (ref 135–145)

## 2015-06-10 LAB — URINALYSIS, ROUTINE W REFLEX MICROSCOPIC
BILIRUBIN URINE: NEGATIVE
Glucose, UA: NEGATIVE mg/dL
HGB URINE DIPSTICK: NEGATIVE
KETONES UR: NEGATIVE mg/dL
Leukocytes, UA: NEGATIVE
NITRITE: NEGATIVE
PH: 6 (ref 5.0–8.0)
Protein, ur: NEGATIVE mg/dL
SPECIFIC GRAVITY, URINE: 1.011 (ref 1.005–1.030)

## 2015-06-10 LAB — PROCALCITONIN: Procalcitonin: <0.1 ng/mL

## 2015-06-10 LAB — TSH: TSH: 5.015 u[IU]/mL — ABNORMAL HIGH (ref 0.350–4.500)

## 2015-06-10 LAB — MRSA PCR SCREENING: MRSA BY PCR: NEGATIVE

## 2015-06-10 LAB — TROPONIN I
TROPONIN I: 1.74 ng/mL — AB (ref <0.031)
Troponin I: 1.65 ng/mL (ref <0.031)
Troponin I: 1.39 ng/mL (ref <0.031)

## 2015-06-10 LAB — CREATININE, SERUM
Creatinine, Ser: 1.53 mg/dL — ABNORMAL HIGH (ref 0.61–1.24)
GFR calc non Af Amer: 39 mL/min — ABNORMAL LOW (ref >60)
GFR, EST AFRICAN AMERICAN: 45 mL/min — AB (ref >60)

## 2015-06-10 LAB — LACTIC ACID, PLASMA
LACTIC ACID, VENOUS: 3.1 mmol/L — AB (ref 0.5–2.0)
Lactic Acid, Venous: 2.5 mmol/L (ref 0.5–2.0)

## 2015-06-10 LAB — STREP PNEUMONIAE URINARY ANTIGEN: Strep Pneumo Urinary Antigen: NEGATIVE

## 2015-06-10 LAB — BRAIN NATRIURETIC PEPTIDE: B Natriuretic Peptide: 601.4 pg/mL — ABNORMAL HIGH (ref 0.0–100.0)

## 2015-06-10 MED ORDER — MEMANTINE HCL 10 MG PO TABS
10.0000 mg | ORAL_TABLET | Freq: Two times a day (BID) | ORAL | Status: DC
  Administered 2015-06-10 – 2015-06-11 (×2): 10 mg via ORAL
  Filled 2015-06-10 (×6): qty 1

## 2015-06-10 MED ORDER — ALBUTEROL SULFATE (2.5 MG/3ML) 0.083% IN NEBU
2.5000 mg | INHALATION_SOLUTION | RESPIRATORY_TRACT | Status: DC | PRN
  Administered 2015-06-11: 2.5 mg via RESPIRATORY_TRACT
  Filled 2015-06-10: qty 3

## 2015-06-10 MED ORDER — VANCOMYCIN HCL IN DEXTROSE 1-5 GM/200ML-% IV SOLN
1000.0000 mg | INTRAVENOUS | Status: DC
  Administered 2015-06-10 – 2015-06-12 (×3): 1000 mg via INTRAVENOUS
  Filled 2015-06-10 (×3): qty 200

## 2015-06-10 MED ORDER — ROSUVASTATIN CALCIUM 20 MG PO TABS
20.0000 mg | ORAL_TABLET | Freq: Every day | ORAL | Status: DC
  Administered 2015-06-10 – 2015-06-11 (×2): 20 mg via ORAL
  Filled 2015-06-10 (×3): qty 1

## 2015-06-10 MED ORDER — PANTOPRAZOLE SODIUM 40 MG IV SOLR
40.0000 mg | INTRAVENOUS | Status: DC
  Administered 2015-06-10 – 2015-06-12 (×3): 40 mg via INTRAVENOUS
  Filled 2015-06-10 (×3): qty 40

## 2015-06-10 MED ORDER — SODIUM CHLORIDE 0.9 % IV BOLUS (SEPSIS)
500.0000 mL | Freq: Once | INTRAVENOUS | Status: AC
  Administered 2015-06-10: 500 mL via INTRAVENOUS

## 2015-06-10 MED ORDER — ASPIRIN EC 325 MG PO TBEC
325.0000 mg | DELAYED_RELEASE_TABLET | Freq: Every day | ORAL | Status: DC
  Administered 2015-06-10 – 2015-06-11 (×2): 325 mg via ORAL
  Filled 2015-06-10 (×2): qty 1

## 2015-06-10 MED ORDER — SODIUM CHLORIDE 0.9 % IV SOLN
INTRAVENOUS | Status: DC
  Administered 2015-06-10 – 2015-06-12 (×3): via INTRAVENOUS

## 2015-06-10 MED ORDER — HEPARIN SODIUM (PORCINE) 5000 UNIT/ML IJ SOLN
5000.0000 [IU] | Freq: Three times a day (TID) | INTRAMUSCULAR | Status: DC
  Administered 2015-06-10 – 2015-06-12 (×5): 5000 [IU] via SUBCUTANEOUS
  Filled 2015-06-10 (×4): qty 1

## 2015-06-10 MED ORDER — DEXTROSE 5 % IV SOLN
1.0000 g | Freq: Once | INTRAVENOUS | Status: AC
  Administered 2015-06-10: 1 g via INTRAVENOUS
  Filled 2015-06-10: qty 10

## 2015-06-10 MED ORDER — DEXTROSE 5 % IV SOLN: 2.0000 g | INTRAVENOUS | Status: DC

## 2015-06-10 MED ORDER — SODIUM CHLORIDE 0.9 % IV BOLUS (SEPSIS)
1000.0000 mL | Freq: Once | INTRAVENOUS | Status: AC
  Administered 2015-06-10: 1000 mL via INTRAVENOUS

## 2015-06-10 MED ORDER — DEXTROSE 5 % IV SOLN
500.0000 mg | Freq: Once | INTRAVENOUS | Status: AC
  Administered 2015-06-10: 500 mg via INTRAVENOUS
  Filled 2015-06-10: qty 500

## 2015-06-10 MED ORDER — DEXTROSE 5 % IV SOLN
1.0000 g | INTRAVENOUS | Status: DC
  Administered 2015-06-11: 1 g via INTRAVENOUS
  Filled 2015-06-10: qty 10

## 2015-06-10 MED ORDER — IPRATROPIUM-ALBUTEROL 0.5-2.5 (3) MG/3ML IN SOLN
3.0000 mL | Freq: Four times a day (QID) | RESPIRATORY_TRACT | Status: DC | PRN
  Administered 2015-06-10 – 2015-06-11 (×2): 3 mL via RESPIRATORY_TRACT
  Filled 2015-06-10 (×3): qty 3

## 2015-06-10 MED ORDER — INSULIN ASPART 100 UNIT/ML ~~LOC~~ SOLN
0.0000 [IU] | Freq: Three times a day (TID) | SUBCUTANEOUS | Status: DC
  Administered 2015-06-11: 3 [IU] via SUBCUTANEOUS
  Administered 2015-06-11 (×2): 5 [IU] via SUBCUTANEOUS
  Administered 2015-06-12 (×2): 3 [IU] via SUBCUTANEOUS

## 2015-06-10 MED ORDER — ALBUTEROL SULFATE (2.5 MG/3ML) 0.083% IN NEBU
5.0000 mg | INHALATION_SOLUTION | Freq: Once | RESPIRATORY_TRACT | Status: AC
  Administered 2015-06-10: 5 mg via RESPIRATORY_TRACT
  Filled 2015-06-10: qty 6

## 2015-06-10 MED ORDER — ALPRAZOLAM 0.25 MG PO TABS
0.2500 mg | ORAL_TABLET | Freq: Once | ORAL | Status: AC
  Administered 2015-06-11: 0.25 mg via ORAL
  Filled 2015-06-10: qty 1

## 2015-06-10 MED ORDER — DEXTROSE 5 % IV SOLN
500.0000 mg | INTRAVENOUS | Status: DC
  Administered 2015-06-11: 500 mg via INTRAVENOUS
  Filled 2015-06-10: qty 500

## 2015-06-10 MED ORDER — SODIUM CHLORIDE 0.9 % IV BOLUS (SEPSIS): 500.0000 mL | Freq: Once | INTRAVENOUS | Status: DC

## 2015-06-10 NOTE — Progress Notes (Signed)
Pharmacy Antibiotic Note  [name] is a 82 y.o. male admitted on 06/22/2015 with community acquired pneumonia.  Pharmacy was consulted for Rocephin and Azithromycin dosing. Pharmacy was subsequently consulted for vancomycin dosing due to patient becoming septic. WBC elevated to 13.1, patient is afebrile.  Plan: Rocephin 1g IV q24 Azithromycin 500mg  IV q24 Vancomycin 1 g IV q24h Monitor renal function, cultures, and VT prn  Weight: 179 lb (81.194 kg)  Temp (24hrs), Avg:98 F (36.7 C), Min:97.9 F (36.6 C), Max:98.1 F (36.7 C)   Recent Labs Lab 06/27/2015 0854  WBC 13.1*  CREATININE 1.85*    Estimated Creatinine Clearance: 27.1 mL/min (by C-G formula based on Cr of 1.85).    Allergies  Allergen Reactions  . Ramipril Other (See Comments)    REACTION: syncope  . Lorazepam Other (See Comments)  . Naproxen Sodium Itching and Rash    REACTION: rash all over  . Niacin Other (See Comments)    REACTION: unspecified  . Sulfonamide Derivatives Other (See Comments)    REACTION: unspecified (no HCTZ allergy)  . Zolpidem Tartrate Other (See Comments)     Thank you for allowing pharmacy to be a part of this patient's care.  Joya San, PharmD Clinical Pharmacy Resident Pager # [phone] 07/04/2015 12:43 PM

## 2015-06-10 NOTE — H&P (Signed)
[facility] History and Physical  [name] [id number] DOB: 04-Aug-1924 DOA: 06/27/2015  Referring physician: ER PCP: [name], MD   Chief Complaint: weakness/sob    HPI: [name] is a 82 y.o. male with significant past history of Alzheimer's dementia described with gait disturbances and progressive cognitive decline as well as a history of coronary artery disease,  status post past post 4 vessel CABG presents to our ER for worsening inability to walk, as well as lower extremity weakness and confusion. Of note, patient here in the room with his 2 sons, one of whom lives with the patient.    Pt's hx of Alzheimer's dementia limits history, most history obtained by family members.  Pt states he is otherwise fine and does not recall why he is here.  Per Family, pt was in normal state of health for him until about 3 days ago.   His  Mentation has worsened with confusion and visual hallucinations.  + bilat LE weakness and inability to bear weight.  Family notes hypoxia with home 02 sats,  ranging in the mid 70s to mid 80s in last couple of days. Pt is not on home O2 and they typically do not check pulse ox.  Patient has not had any fevers or cough, no sick contacts, no recent travels.   Pt's appetite has been very good though, and eating like normal per family.  Pt has not c/o of chest pains to them nor does he complain to me of doe or CP.  Per family, bms have been normal, last bm this am.  Pt is confirmed to be DNR/DNI w/ family.  In ED, pt noted hypotensive, tachycardic, found to have new left basilar opacities, as well as afib rate 60s, w/ st depressions in I/avl.  Pt was started on 30mg /kg fluid bolus per sepsis protocol per ED, treated for CAP w/ rocephin and azithromycin, and was seen by Critical Care Team and Cardiologist.   Review of Systems:  Per HPI, very limited due to pt's dementia.  Past Medical History  Diagnosis Date  . Anxiety   . Hypertension   .  Hyperlipidemia   . CAD (coronary artery disease)   . Dementia   . Gout, unspecified 2012    crystal analysis at urgent care  . AF (atrial fibrillation) ([place])   . 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 Cancer ([place]) 02/25/11    Squamous Cell Carcinoma- Left Dorsum Hand  . Type II or unspecified type diabetes mellitus without mention of complication, not stated as uncontrolled 06/18/2006  . Stroke ([place]) 07/13/11    Acute left parietal subcortical and periventricular white matter infarction.  . Carotid artery occlusion   . Alzheimer's dementia    Past Surgical History  Procedure Laterality Date  . Cholecystectomy    . Endarterectomy  1996    Bilateral (2 weeks apart)  . Skin cancer removal  06/2007    (? basal cell)  . 4 vessel cabg  06/97  . Mri brain  02/23/10    No acute abnormality Chronic Cerebral Ischemia Stable Ventr Prominence due to Volume loss  . Pseudoaneurysm repair  08/26/10    Right carotid endarterectomy pseudoaneurysm  . Peg placement    . Carotid endarterectomy  03/11/02 and July 2013   Social History:  reports that he has quit smoking. His smoking use included Cigarettes. He has never used smokeless tobacco. He reports that he does not drink alcohol  or use illicit drugs.  Allergies  Allergen Reactions  . Ramipril Other (See Comments)    REACTION: syncope  . Lorazepam Other (See Comments)  . Naproxen Sodium Itching and Rash    REACTION: rash all over  . Niacin Other (See Comments)    REACTION: unspecified  . Sulfonamide Derivatives Other (See Comments)    REACTION: unspecified (no HCTZ allergy)  . Zolpidem Tartrate Other (See Comments)    Family History  Problem Relation Age of Onset  . Colon cancer Sister   . Hypertension Brother   . Cancer Brother     intestine  . Brain cancer Brother   . Melanoma Sister   . Coronary artery disease Son     60, heart attack  . Diabetes Son   . Heart disease Son     After 75 yrs of age  .  Hypertension Son   . Heart attack Son   . Other Mother     Intestinal  Blockage  . Other Father     Intestinal Blockage  . Diabetes Daughter   . Hypertension Daughter   . Hypertension Son     Prior to Admission medications   Medication Sig Start Date End Date Taking? Authorizing Provider  acetaminophen (TYLENOL) 500 MG tablet Take 500 mg by mouth once.   Yes Historical Provider, MD  allopurinol (ZYLOPRIM) 100 MG tablet take 1 tablet by mouth once daily 01/05/15  Yes Tonia Ghent, MD  ALPRAZolam Duanne Moron) 0.25 MG tablet take 1 tab by mouth twice daily Patient taking differently: Take 0.25 mg by mouth 2 (two) times daily as needed for anxiety.  06/01/15  Yes Tonia Ghent, MD  furosemide (LASIX) 40 MG tablet Take 1 tablet (40 mg total) by mouth daily. 01/18/15  Yes Tonia Ghent, MD  memantine (NAMENDA) 10 MG tablet Take 1 tablet (10 mg total) by mouth 2 (two) times daily. 01/18/15  Yes Tonia Ghent, MD  metoprolol (LOPRESSOR) 50 MG tablet Take 50 mg by mouth 2 (two) times daily.   Yes Historical Provider, MD  pantoprazole (PROTONIX) 40 MG tablet Take 1 tablet (40 mg total) by mouth 2 (two) times daily. 01/18/15  Yes Tonia Ghent, MD  rosuvastatin (CRESTOR) 20 MG tablet take 1 tablet by mouth once daily Patient taking differently: take 1 tablet by mouth once daily in evening 01/10/15  Yes Tonia Ghent, MD  sertraline (ZOLOFT) 50 MG tablet Take 1 tablet (50 mg total) by mouth daily. Patient taking differently: Take 50 mg by mouth at bedtime.  01/18/15  Yes Tonia Ghent, MD  XARELTO 15 MG TABS tablet take 1 tablet by mouth once daily 09/05/14  Yes Tonia Ghent, MD  acetaminophen (TYLENOL) 325 MG tablet Take 650 mg by mouth 3 (three) times daily.    Historical Provider, MD   Physical Exam: Filed Vitals:   06/06/2015 1054 06/28/2015 1100 06/12/2015 1130 06/28/2015 1200  BP:  71/53 83/45 89/71   Pulse:  63 128 58  Temp: 98.1 F (36.7 C)     TempSrc: Oral     Resp:  15 16 20     Weight:      SpO2:  91% 91% 91%    Wt Readings from Last 3 Encounters:  06/24/2015 81.194 kg (179 lb)  05/29/15 81.194 kg (179 lb)  01/18/15 80.627 kg (177 lb 12 oz)    General:  Appears calm and comfortable, pleasant, NAD, pleasant, not oriented to self/place, denies complaints.  Obese. Not  tachypnea on exam. Eyes: PERRL, normal lids, irises & conjunctiva ENT: hard of hearing, lips & tongue, mmm Neck: no LAD, masses or thyromegaly Cardiovascular: afib, rate 60s on tele, no m/r/g. No LE edema. Telemetry: afib rate 60s on tele. Respiratory: diminished diffusely. Normal respiratory effort. Abdomen: soft, ntnd, +bs, obese. Skin: no rash or induration seen on limited exam Musculoskeletal: grossly normal tone BUE/BLE Psychiatric: grossly normal mood and affect, speech fluent and appropriate, +dementia Neurologic: grossly non-focal.          Labs on Admission:  Basic Metabolic Panel:  Recent Labs Lab 06/17/2015 0854  NA 135  K 3.8  CL 97*  CO2 24  GLUCOSE 200*  BUN 23*  CREATININE 1.85*  CALCIUM 9.1   Liver Function Tests: No results for input(s): AST, ALT, ALKPHOS, BILITOT, PROT, ALBUMIN in the last 168 hours. No results for input(s): LIPASE, AMYLASE in the last 168 hours. No results for input(s): AMMONIA in the last 168 hours. CBC:  Recent Labs Lab 06/15/2015 0854  WBC 13.1*  NEUTROABS 10.1*  HGB 12.5*  HCT 38.6*  MCV 95.1  PLT 226   Cardiac Enzymes:  Recent Labs Lab 07/01/2015 0854  TROPONINI 1.74*    BNP (last 3 results)  Recent Labs  07/04/2015 0854  BNP 601.4*    ProBNP (last 3 results) No results for input(s): PROBNP in the last 8760 hours.  CBG: No results for input(s): GLUCAP in the last 168 hours.  Radiological Exams on Admission: Dg Chest Portable 1 View  06/18/2015  CLINICAL DATA:  Shortness of breath, congestion and nausea for 3 days. EXAM: PORTABLE CHEST 1 VIEW COMPARISON:  06/28/2014 FINDINGS: New densities at the left lung base,  particularly in the retrocardiac space. Post CABG changes in the chest. The right lung is clear. No evidence for pulmonary edema. Heart size is within normal limits and stable. Negative for a pneumothorax. Bony thorax is intact. IMPRESSION: New left basilar chest densities. Findings could represent atelectasis or infection. Electronically Signed   By: Markus Daft M.D.   On: 06/18/2015 08:56    EKG:  afib 60bpm, st depressions 1/avl (06/14/2015 at 9:31am)  Assessment/Plan Principal Problem:   CAP (community acquired pneumonia) Active Problems:   Diabetes mellitus without complication ([place])   Dementia   Coronary atherosclerosis   AKI (acute kidney injury) ([place])   Pneumonia Sepsis  1. Sepsis, w/ hypotension/tachycardia, in setting of LLL pneumonia, no recent hospitalizations, presumed CAP. + lactic acid of 4.   Admit to Step down b/c dnr/dni, aggressive ivf w/ sepsis protocol, pan-cxd, emp abx with rocephin and azithromycin.  Added vancomycin iv, pharmacy to assist renal dosed.  Narrow abx spectrum when clinically warranted.  Pt was seen by [place], given DNR status, will admit to step-down unit, appreciate assistance - signed off.  Holding home antihypertensives in setting of hypotension.    2. Cap/pna:  See #1.  Per notes, in ED on 05/22/15 for n/v/HA, improved w/ tylenol, and sent home from ED.  F/u on cxs.   Continue Rocephin, Azithromycin, Vancomycin broad spectrum abx, narrow when appropriate.  3. St depressions on  EKG in 1/avl as well as elevated troponins, w/ hx CAD 4v CABG, elevated BNP, suspect demand ischemia. Cardiology Dr Jenna Luo was called to evaluate pt, appreciate assistance.  Follow troponins serially, npo at [facility], echo ordered to further eval. Cardiac function.   4. Atrial fib, currently rate controlled - holding home Xarelto currenlty, given possible further intervention.    5.  Aki, in setting of hypotension and sepsis. Iv boluses ongoing, will follow  6. Dm w/  hyperglycemia - chk hba1c, accuchks, riss for now.  7. Htn, currently hypotensive.  Holding home antihypertensives (metoprolol and lasix), ivf for now.  8. Alzheimer's dementia - continue Namenda as able.   Pt is lucid and appears in no acute distress, although his VSS show otherwise. D/w pt/family that status is guarded at this time given severity of presentation, advanced age, and critical illness.  Critical Care Medicine was consulted in ED, signed off. Cardiology Dr Maryclare Labrador was consulted in ED.  Code Status: DNR/DNI DVT Prophylaxis: hep [place] 5k bid Family Communication: pt Disposition Plan:  3-4days Time spent: 44mins   Maren Reamer MD., MBA/MHA [facility] Pager [phone]

## 2015-06-10 NOTE — ED Notes (Addendum)
Pt. Voided into the urinal and the urinal fell into the bed.  Pt. Was bathed and linens and gown changed.  DAughter at the bedside. Updated plan of care.

## 2015-06-10 NOTE — Consult Note (Signed)
CARDIOLOGY CONSULT NOTE       Patient ID: [name] MRN: [id number] DOB/AGE: Sep 22, 1924 82 y.o.  Admit date: 06/19/2015 Referring Physician:  Christy Alvarado Primary Physician: Sean Stain, MD Primary Cardiologist:  [name] Reason for Consultation: Elevated Troponin  Principal Problem:   Sepsis associated hypotension [name]) Active Problems:   Diabetes mellitus without complication ([name])   Dementia   Coronary atherosclerosis   CAP (community acquired pneumonia)   AKI (acute kidney injury) ([name])   Pneumonia   HPI:  82 y.o. distant CABG 38.  Previous CEA with aneurysm repair.  Long standing progressive dementia.  DNR.  Brought in by family for progressive FTT.  More confused.  Legs weak inability to walk and confusion CXR in ER with pneumonia left base. No fever chills sputum.  Actually still eating well.  No chest pain Troponin noted to be elevated at 1.74 with no acute ECG changes   ROS All other systems reviewed and negative except as noted above  Past Medical History  Diagnosis Date  . Anxiety   . Hypertension   . Hyperlipidemia   . CAD (coronary artery disease)   . Dementia   . Gout, unspecified 2012    crystal analysis at urgent care  . AF (atrial fibrillation) ([name])   . 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 Cancer ([place]) 02/25/11    Squamous Cell Carcinoma- Left Dorsum Hand  . Type II or unspecified type diabetes mellitus without mention of complication, not stated as uncontrolled 06/18/2006  . Stroke ([place]) 07/13/11    Acute left parietal subcortical and periventricular white matter infarction.  . Carotid artery occlusion   . Alzheimer's dementia     Family History  Problem Relation Age of Onset  . Colon cancer Sister   . Hypertension Brother   . Cancer Brother     intestine  . Brain cancer Brother   . Melanoma Sister   . Coronary artery disease Son     66, heart attack  . Diabetes Son   . Heart disease Son     After 16 yrs  of age  . Hypertension Son   . Heart attack Son   . Other Mother     Intestinal  Blockage  . Other Father     Intestinal Blockage  . Diabetes Daughter   . Hypertension Daughter   . Hypertension Son     Social History   Social History  . Marital Status: Widowed    Spouse Name: N/A  . Number of Children: 5  . Years of Education: 12   Occupational History  . Retired from [employer]    Social History Main Topics  . Smoking status: Former Smoker    Types: Cigarettes  . Smokeless tobacco: Never Used     Comment: Remote mild smoking history  . Alcohol Use: No  . Drug Use: No  . Sexual Activity: Not on file   Other Topics Concern  . Not on file   Social History Narrative   Son lives with patient, helps out at home.   Drinks caffeine occasionally   Drinks a fair amount of tea     Past Surgical History  Procedure Laterality Date  . Cholecystectomy    . Endarterectomy  1996    Bilateral (2 weeks apart)  . Skin cancer removal  06/2007    (? basal cell)  . 4 vessel cabg  06/97  . Mri brain  02/23/10  No acute abnormality Chronic Cerebral Ischemia Stable Ventr Prominence due to Volume loss  . Pseudoaneurysm repair  08/26/10    Right carotid endarterectomy pseudoaneurysm  . Peg placement    . Carotid endarterectomy  03/11/02 and July 2013     . aspirin EC  325 mg Oral Daily  . heparin  5,000 Units Subcutaneous Q8H  . memantine  10 mg Oral BID  . pantoprazole (PROTONIX) IV  40 mg Intravenous Q24H  . rosuvastatin  20 mg Oral Daily   . [START ON 06/11/2015] azithromycin    . [START ON 06/11/2015] cefTRIAXone (ROCEPHIN)  IV    . sodium chloride 1,000 mL (06/06/2015 1302)   And  . sodium chloride     And  . sodium chloride Stopped (06/06/2015 1301)  . vancomycin 1,000 mg (06/21/2015 1306)    Physical Exam: Blood pressure 106/59, pulse 56, temperature 98.1 F (36.7 C), temperature source Oral, resp. rate 16, weight 81.194 kg (179 lb), SpO2 90 %.    Confused    Chronically ill white male  HEENT: normal Neck supple with no adenopathy JVP normal no bruits no thyromegaly Lungs clear with no wheezing and good diaphragmatic motion Heart:  S1/S2 no murmur, no rub, gallop or click Sternotomy and previous CEA  PMI normal Abdomen: benighn, BS positve, no tenderness, no AAA no bruit.  No HSM or HJR Distal pulses intact with no bruits No edema Neuro non-focal Skin warm and dry No muscular weakness   Labs:   Lab Results  Component Value Date   WBC 13.1* 06/07/2015   HGB 12.5* 06/12/2015   HCT 38.6* 07/03/2015   MCV 95.1 06/18/2015   PLT 226 06/24/2015    Recent Labs Lab 06/18/2015 0854  NA 135  K 3.8  CL 97*  CO2 24  BUN 23*  CREATININE 1.85*  CALCIUM 9.1  GLUCOSE 200*   Lab Results  Component Value Date   CKTOTAL 93 07/13/2011   CKMB 2.8 07/13/2011   TROPONINI 1.74* 07/02/2015    Lab Results  Component Value Date   CHOL 130 01/18/2015   CHOL 136 01/13/2014   CHOL 141 01/19/2013   Lab Results  Component Value Date   HDL 33.30* 01/18/2015   HDL 34.70* 01/13/2014   HDL 36.20* 01/19/2013   Lab Results  Component Value Date   LDLCALC 64 11/25/2011   LDLCALC 48 07/14/2011   LDLCALC 58 06/10/2011   Lab Results  Component Value Date   TRIG 227.0* 01/18/2015   TRIG 258.0* 01/13/2014   TRIG 240.0* 01/19/2013   Lab Results  Component Value Date   CHOLHDL 4 01/18/2015   CHOLHDL 4 01/13/2014   CHOLHDL 4 01/19/2013   Lab Results  Component Value Date   LDLDIRECT 69.0 01/18/2015   LDLDIRECT 71.1 01/13/2014   LDLDIRECT 79.8 01/19/2013      Radiology: Dg Cervical Spine Complete  05/29/2015  CLINICAL DATA:  Posterior neck pain.  No trauma history submitted. EXAM: CERVICAL SPINE - COMPLETE 4+ VIEW COMPARISON:  None. FINDINGS: The lateral view images through the bottom of C6. Prevertebral soft tissues are within normal limits. Loss of intervertebral disc height at C5-6 and C6-7. Endplate osteophytes, primarily at C6-7.  Left neural foraminal narrowing at C3-4 and C5-6 secondary to uncovertebral joint hypertrophy. Contralateral oblique images suboptimal secondary to patient positioning. Median sternotomy. Surgical clips within the neck bilaterally. Lateral masses and odontoid process grossly unremarkable with partially obscured on open-mouth view. Suboptimal swimmer's view. IMPRESSION: Suboptimal evaluation of the C1-2  and C7-T1 levels. Spondylosis, without acute osseous finding otherwise. Electronically Signed   By: Abigail Miyamoto M.D.   On: 05/29/2015 14:29   Ct Head Wo Contrast  05/22/2015  CLINICAL DATA:  Occipital headache beginning today. Any coagulated. Dementia. EXAM: CT HEAD WITHOUT CONTRAST TECHNIQUE: Contiguous axial images were obtained from the base of the skull through the vertex without intravenous contrast. COMPARISON:  07/05/2014 FINDINGS: The brain shows generalized atrophy, most pronounced in the temporal lobes. 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 Few other scattered small vessel cerebellar infarctions. Mild small vessel change of the cerebral hemispheric white matter. No sign of acute infarction, mass lesion, hemorrhage, hydrocephalus or extra-axial collection. No calvarial abnormality. No evidence of acute sinus disease. There is atherosclerotic calcification of the major vessels at the base of the brain. IMPRESSION: No acute finding. Generalized atrophy, most pronounced affecting the temporal lobes. 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 Electronically Signed   By: Nelson Chimes M.D.   On: 05/22/2015 19:48   Dg Chest Portable 1 View  06/26/2015  CLINICAL DATA:  Shortness of breath, congestion and nausea for 3 days. EXAM: PORTABLE CHEST 1 VIEW COMPARISON:  06/28/2014 FINDINGS: New densities at the left lung base, particularly in the retrocardiac space. Post CABG changes in the chest. The right lung is clear. No evidence for pulmonary edema. Heart size is within normal limits and stable. Negative  for a pneumothorax. Bony thorax is intact. IMPRESSION: New left basilar chest densities. Findings could represent atelectasis or infection. Electronically Signed   By: Markus Daft M.D.   On: 06/29/2015 08:56    EKG: SR read as afib limb lead reversal J point elevation inferiorly and latera T wave changes   ASSESSMENT AND PLAN:  Elevated Troponin:  No chest pain. ECG with possible inferolateral ischemia. Distant CABG 44  Given age , dementia and DNR status not a candidate for invasive evaluation.  ASA Follow serial enzymes and ECG Pneumonia:  Some hypoxia in ER  Ceftriaxone and azithromycin per primary service MS:  Long standing dementia likely worse from infection or ischemic event.  Agree with DNR Hydrate  Signed: Jenkins Rouge 07/01/2015, 1:13 PM

## 2015-06-10 NOTE — ED Notes (Signed)
Dr Christy Gentles at bedside

## 2015-06-10 NOTE — Progress Notes (Signed)
CRITICAL VALUE ALERT  Critical value received:  Lactic 3.1  Date of notification: 06/21/2015  Time of notification:  2015  Critical value read back: Yes  Nurse who received alert:  Casimiro Needle  MD notified (1st page): Donnal Debar  Time of first page:  2019  MD notified (2nd page):  Time of second page:  Responding MD: Donnal Debar  Time MD responded:  2020  No new orders received

## 2015-06-10 NOTE — ED Notes (Signed)
Attempted report 

## 2015-06-10 NOTE — ED Notes (Signed)
Pt reports sob for 3 days with generalized weakness. Difficulty urinating and incontinence. Pt has been seeing people in the yard that are not there per family. Hx of dementia. Pt placed on 2L of O2 dfor sats at 78% increase to 93%.

## 2015-06-10 NOTE — Consult Note (Addendum)
Name: [name] MRN: [id number] DOB: October 16, 1924    ADMISSION DATE:  06/30/2015 CONSULTATION DATE: 5/6  REFERRING MD :  Christy Gentles   CHIEF COMPLAINT:  Sepsis/septic shock   BRIEF PATIENT DESCRIPTION:  This is a 82 year old male w/ sig h/o dementia (w/ gait disturbance and progressive cognitive decline). Lives w/ son at his home. Presented to the ED on 5/6 w/ sons reporting 3d h/o increased weakness, inability to walk, urinary incontinence and worsening confusion from baseline. He has a pulse ox at home.  He was noted to have sats 78% so EMS was called. He was placed on O2, sats increased to 90s, on arrival to ED he was hypotensive w/ lactic acid of 4 and mild trop I elevation. His PCXR raised concern for LLL airspace disease.  PCCM was consulted w/ concern for septic shock.   PAST MEDICAL HISTORY :   has a past medical history of Anxiety; Hypertension; Hyperlipidemia; CAD (coronary artery disease); Dementia; Gout, unspecified (2012); AF (atrial fibrillation) ([place]); Dysphagia; Cancer ([place]) (02/25/11); Type II or unspecified type diabetes mellitus without mention of complication, not stated as uncontrolled (06/18/2006); Stroke [facility]) (07/13/11); Carotid artery occlusion; and Alzheimer's dementia.  has past surgical history that includes Cholecystectomy; Endarterectomy (1996); skin cancer removal (06/2007); 4 vessel CABG (06/97); MRI brain (02/23/10); Pseudoaneurysm repair (08/26/10); PEG placement; and Carotid endarterectomy (03/11/02 and July 2013). Prior to Admission medications   Medication Sig Start Date End Date Taking? Authorizing Provider  acetaminophen (TYLENOL) 500 MG tablet Take 500 mg by mouth once.   Yes Historical Provider, MD  allopurinol (ZYLOPRIM) 100 MG tablet take 1 tablet by mouth once daily 01/05/15  Yes Tonia Ghent, MD  ALPRAZolam Duanne Moron) 0.25 MG tablet take 1 tab by mouth twice daily Patient taking differently: Take 0.25 mg by mouth 2 (two) times daily as needed for  anxiety.  06/01/15  Yes Tonia Ghent, MD  furosemide (LASIX) 40 MG tablet Take 1 tablet (40 mg total) by mouth daily. 01/18/15  Yes Tonia Ghent, MD  memantine (NAMENDA) 10 MG tablet Take 1 tablet (10 mg total) by mouth 2 (two) times daily. 01/18/15  Yes Tonia Ghent, MD  metoprolol (LOPRESSOR) 50 MG tablet Take 50 mg by mouth 2 (two) times daily.   Yes Historical Provider, MD  pantoprazole (PROTONIX) 40 MG tablet Take 1 tablet (40 mg total) by mouth 2 (two) times daily. 01/18/15  Yes Tonia Ghent, MD  rosuvastatin (CRESTOR) 20 MG tablet take 1 tablet by mouth once daily Patient taking differently: take 1 tablet by mouth once daily in evening 01/10/15  Yes Tonia Ghent, MD  sertraline (ZOLOFT) 50 MG tablet Take 1 tablet (50 mg total) by mouth daily. Patient taking differently: Take 50 mg by mouth at bedtime.  01/18/15  Yes Tonia Ghent, MD  XARELTO 15 MG TABS tablet take 1 tablet by mouth once daily 09/05/14  Yes Tonia Ghent, MD  acetaminophen (TYLENOL) 325 MG tablet Take 650 mg by mouth 3 (three) times daily.    Historical Provider, MD   Allergies  Allergen Reactions  . Ramipril Other (See Comments)    REACTION: syncope  . Lorazepam Other (See Comments)  . Naproxen Sodium Itching and Rash    REACTION: rash all over  . Niacin Other (See Comments)    REACTION: unspecified  . Sulfonamide Derivatives Other (See Comments)    REACTION: unspecified (no HCTZ allergy)  . Zolpidem Tartrate Other (See Comments)  FAMILY HISTORY:  family history includes Brain cancer in his brother; Cancer in his brother; Colon cancer in his sister; Coronary artery disease in his son; Diabetes in his daughter and son; Heart attack in his son; Heart disease in his son; Hypertension in his brother, daughter, son, and son; Melanoma in his sister; Other in his father and mother. SOCIAL HISTORY:  reports that he has quit smoking. His smoking use included Cigarettes. He has never used smokeless  tobacco. He reports that he does not drink alcohol or use illicit drugs.  REVIEW OF SYSTEMS:   Unable to assess given dementia   SUBJECTIVE:  No distress  VITAL SIGNS: Temp:  [97.9 F (36.6 C)-98.1 F (36.7 C)] 98.1 F (36.7 C) (05/06 1054) Pulse Rate:  [57-87] 63 (05/06 1100) Resp:  [15-23] 15 (05/06 1100) BP: (71-124)/(53-99) 71/53 mmHg (05/06 1100) SpO2:  [84 %-94 %] 91 % (05/06 1100) Weight:  [179 lb (81.194 kg)] 179 lb (81.194 kg) (05/06 0932) 2 liters   PHYSICAL EXAMINATION: General:  Pleasant obese white elderly male, not in acute distress.  Neuro:  Awake, moves all ext. Confused but cooperative  HEENT:  NCAT, PERRL, no JVD, MMM Cardiovascular:  AF w/ CVR. No MRG Lungs:  Decreased left base. No accessory muscle use  Abdomen:  Soft, not tender. + bowel sounds  Musculoskeletal:  Equal st and bulk  Skin:  Warm and dry    Recent Labs Lab 06/29/2015 0854  NA 135  K 3.8  CL 97*  CO2 24  BUN 23*  CREATININE 1.85*  GLUCOSE 200*    Recent Labs Lab 06/07/2015 0854  HGB 12.5*  HCT 38.6*  WBC 13.1*  PLT 226   Dg Chest Portable 1 View  06/12/2015  CLINICAL DATA:  Shortness of breath, congestion and nausea for 3 days. EXAM: PORTABLE CHEST 1 VIEW COMPARISON:  06/28/2014 FINDINGS: New densities at the left lung base, particularly in the retrocardiac space. Post CABG changes in the chest. The right lung is clear. No evidence for pulmonary edema. Heart size is within normal limits and stable. Negative for a pneumothorax. Bony thorax is intact. IMPRESSION: New left basilar chest densities. Findings could represent atelectasis or infection. Electronically Signed   By: Markus Daft M.D.   On: 06/26/2015 08:56    ASSESSMENT / PLAN: Acute Hypoxic Respiratory failure  Severe sepsis/septic shock Possible LLL PNA Urinary frequency and incontinence  Acute on chronic renal failure Abnormal Troponin -->w/ h/o CAD Atrial fib on Xarelto  DM Dementia w/ cognitive decline as well as  gait disturbance   Discussion  This is a pleasantly confused 82 year old male w/ what appears to be long standing h/o dementia w/ progressive functional and cognitive decline. He is cared for by his son @ home. He presented w/ a working dx of presumed septic shock in setting of Possible LLL CAP +/- UTI. His CEs are elevated, he has been hypotensive w/ elevated lactic acid of 4. His f/u LA is pending s/p IVFs. He has remained hypotensive but his is very fidgety and given how alert and animated he is it's difficult to believe that recorded BPs in 50-60s are accurate. Spoke at length w/ his two sons at bedside. They tell us he would not want life support should his condition worsen. We talked also about escalation of care ie: central access, pressors etc. We decided based on his confusion and dementia that he would be at high risk to pull lines out and that it would  not ultimately improve his outcome. Based on this we have advised DNR and aggressive medical rx but no life ACLS meds, pressors or life support. They understand that he could deteriorate and not survive.   Plan FULL DNR w/ No pressors or central access Conservative medical care this should include the following: Admit to SDU O2 via [place] IVFs (and complete 30 ml/kg challenge) Pan-culture; send urine antigens for strep and legionella  Empiric CAP coverage  Cardiology has been consulted to eval trop elevation and possibility of non-infectious etiology , No role for PCCM we will s/o. Can be admitted by medical service   Erick Colace ACNP-BC [place] Pager # [phone] OR # [phone] if no answer  STAFF NOTE: I, Merrie Roof, MD FACP have personally reviewed patient's available data, including medical history, events of note, physical examination and test results as part of my evaluation. I have discussed with resident/NP and other care providers such as pharmacist, RN and RRT. In addition, I personally evaluated  patient and elicited key findings of: no fevers, no sputum production, does have LLL hazziness, reasonable to consider PNA and sepsis but is not clear at this time, trop is a concern an cardiac contribution to lactic acid, provide empiric cap coverage and continued sepsis protocol with fluids, see my repeat assessment below, would have cardiology consult, echo, follow trop, DNR wished and no pressors or line considering dementia and medical co morbid's, cuff accuracy and issue, with fluids, repeat lactic acid levels, to sdu, triad, will sign off  Lavon Paganini. Titus Mould, MD, [place] Pgr: [facility] 06/12/2015 12:29 PM    Sepsis - Repeat Assessment  Performed at:    11 am 5/6  Vitals     Blood pressure 89/71, pulse 58, temperature 98.1 F (36.7 C), temperature source Oral, resp. rate 20, weight 81.194 kg (179 lb), SpO2 91 %.  Heart:     Irregular rate and rhythm  Lungs:    CTA  Capillary Refill:   <2 sec  Peripheral Pulse:   Radial pulse palpable  Skin:     Normal Color   Lavon Paganini. Titus Mould, MD, [place] Pgr: [facility]

## 2015-06-10 NOTE — ED Provider Notes (Signed)
Seen by critical care dr Titus Mould Pt is DNR/DNI They request hospitalist admission D/w triad team for admission, dr Elenore Paddy, MD 06/08/2015 [phone]

## 2015-06-10 NOTE — ED Provider Notes (Signed)
CSN: [id number]     Arrival date & time 06/18/2015  0825 History   First MD Initiated Contact with Patient 07/04/2015 [phone]     Chief Complaint  Patient presents with  . Shortness of Breath    LEVEL 5 CAVEAT DUE TO ALTERED MENTAL STATUS  Patient is a 82 y.o. male presenting with shortness of breath. The history is provided by the patient, a relative and a caregiver.  Shortness of Breath Severity:  Moderate Onset quality:  Gradual Duration:  3 days Timing:  Intermittent Progression:  Worsening Chronicity:  New Relieved by:  Rest Worsened by:  Activity Associated symptoms: no chest pain, no cough, no fever and no vomiting   Risk factors: no tobacco use   Patient with h/o HTN, atrial fibrillation on xarelto, h/o dementia, presents for 3 days history of increasing SOB No fever/cough No active CP Per son (lives with patient) pt has been noticeably fatigued.  He can ambulate only short distances and will get short of breath.  He also has generalized weakness.  No syncope Son has a pulse ox at home and pt has had hypoxia but he is not on home oxygen and no known lung disorders   Past Medical History  Diagnosis Date  . Anxiety   . Hypertension   . Hyperlipidemia   . CAD (coronary artery disease)   . Dementia   . Gout, unspecified 2012    crystal analysis at urgent care  . AF (atrial fibrillation) ([place])   . 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 Cancer ([place]) 02/25/11    Squamous Cell Carcinoma- Left Dorsum Hand  . Type II or unspecified type diabetes mellitus without mention of complication, not stated as uncontrolled 06/18/2006  . Stroke ([place]) 07/13/11    Acute left parietal subcortical and periventricular white matter infarction.  . Carotid artery occlusion   . Alzheimer's dementia    Past Surgical History  Procedure Laterality Date  . Cholecystectomy    . Endarterectomy  1996    Bilateral (2 weeks apart)  . Skin cancer removal  06/2007    (? basal  cell)  . 4 vessel cabg  06/97  . Mri brain  02/23/10    No acute abnormality Chronic Cerebral Ischemia Stable Ventr Prominence due to Volume loss  . Pseudoaneurysm repair  08/26/10    Right carotid endarterectomy pseudoaneurysm  . Peg placement    . Carotid endarterectomy  03/11/02 and July 2013   Family History  Problem Relation Age of Onset  . Colon cancer Sister   . Hypertension Brother   . Cancer Brother     intestine  . Brain cancer Brother   . Melanoma Sister   . Coronary artery disease Son     21, heart attack  . Diabetes Son   . Heart disease Son     After 83 yrs of age  . Hypertension Son   . Heart attack Son   . Other Mother     Intestinal  Blockage  . Other Father     Intestinal Blockage  . Diabetes Daughter   . Hypertension Daughter   . Hypertension Son    Social History  Substance Use Topics  . Smoking status: Former Smoker    Types: Cigarettes  . Smokeless tobacco: Never Used     Comment: Remote mild smoking history  . Alcohol Use: No    Review of Systems  Unable to perform ROS: Mental status change  Constitutional: Positive for fatigue. Negative for fever.  Respiratory: Positive for shortness of breath. Negative for cough.   Cardiovascular: Negative for chest pain.  Gastrointestinal: Negative for vomiting.  Genitourinary: Positive for difficulty urinating.  Neurological: Positive for weakness. Negative for syncope.      Allergies  Ramipril; Lorazepam; Naproxen sodium; Niacin; Sulfonamide derivatives; and Zolpidem tartrate  Home Medications   Prior to Admission medications   Medication Sig Start Date End Date Taking? Authorizing Provider  acetaminophen (TYLENOL) 325 MG tablet Take 650 mg by mouth 3 (three) times daily.    Historical Provider, MD  allopurinol (ZYLOPRIM) 100 MG tablet take 1 tablet by mouth once daily 01/05/15   Tonia Ghent, MD  ALPRAZolam Duanne Moron) 0.25 MG tablet take 1 tab by mouth twice daily 06/01/15   Tonia Ghent, MD   furosemide (LASIX) 40 MG tablet Take 1 tablet (40 mg total) by mouth daily. 01/18/15   Tonia Ghent, MD  memantine (NAMENDA) 10 MG tablet Take 1 tablet (10 mg total) by mouth 2 (two) times daily. 01/18/15   Tonia Ghent, MD  metoprolol (LOPRESSOR) 50 MG tablet Take 50 mg by mouth 2 (two) times daily.    Historical Provider, MD  pantoprazole (PROTONIX) 40 MG tablet Take 1 tablet (40 mg total) by mouth 2 (two) times daily. 01/18/15   Tonia Ghent, MD  rosuvastatin (CRESTOR) 20 MG tablet take 1 tablet by mouth once daily Patient taking differently: take 1 tablet by mouth once daily in evening 01/10/15   Tonia Ghent, MD  sertraline (ZOLOFT) 50 MG tablet Take 1 tablet (50 mg total) by mouth daily. Patient taking differently: Take 50 mg by mouth at bedtime.  01/18/15   Tonia Ghent, MD  XARELTO 15 MG TABS tablet take 1 tablet by mouth once daily 09/05/14   Tonia Ghent, MD   BP 124/99 mmHg  Pulse 71  Temp(Src) 97.9 F (36.6 C) (Oral)  Resp 23  SpO2 91% Physical Exam CONSTITUTIONAL: Elderly, frail HEAD: Normocephalic/atraumatic EYES: EOMI/PERRL ENMT: Mucous membranes dry NECK: supple no meningeal signs SPINE/BACK:entire spine nontender CV: irregular, no harsh murmurs noted LUNGS:equal breath sounds noted noted bilaterally in upper airway.  Decreased BS in left base.  No obvious crackles noted ABDOMEN: soft, nontender GU:no cva tenderness NEURO: Pt is awake/alert but he appears confused and makes inappropriate comments during history.  He has no gross motor deficits in his extremities  EXTREMITIES: full ROM, mild pitting edema to bilateral LE SKIN: warm, color normal PSYCH: unable to fully assess  ED Course  Procedures  CRITICAL CARE Performed by: Sharyon Cable Total critical care time: 45 minutes Critical care time was exclusive of separately billable procedures and treating other patients. Critical care was necessary to treat or prevent imminent or  life-threatening deterioration. Critical care was time spent personally by me on the following activities: development of treatment plan with patient and/or surrogate as well as nursing, discussions with consultants, evaluation of patient's response to treatment, examination of patient, obtaining history from patient or surrogate, ordering and performing treatments and interventions, ordering and review of laboratory studies, ordering and review of radiographic studies, pulse oximetry and re-evaluation of patient's condition. PATIENT WITH PNEUMONIA REQUIRING OXYGEN, REQUIRING IV ANTIBIOTICS AND IV FLUIDS  8:56 AM Pt with increasing SOB/hypoxia for past 3 days He is hypoxic on arrival but this corrects with oxygen Will follow closely 9:20 AM PNEUMONIA NOTED ON CXR HE ALSO HAS LEUKOCYTOSIS HE IS TACHYPNEIC/HYPOXIC WITH WBC >13 CODE  SEPSIS CALLED IV FLUIDS ORDERED IV ANTIBIOTICS ORDERED AFTER CULTURES 10:09 AM Pt with elevated troponin He has ST depression noted on EKG but NO STEMI Suspect this is demand ischemia from underlying sepsis Awaiting lactates at this time   Sepsis - Repeat Assessment  Performed at:    10:09am  Vitals     Blood pressure 102/61, pulse 59, temperature 97.9 F (36.6 C), temperature source Oral, resp. rate 18, SpO2 94 %.  Heart:     Irregular rate and rhythm  Lungs:    CTA  Capillary Refill:   <2 sec  Peripheral Pulse:   Radial pulse palpable  Skin:     Normal Color  10:45 AM D/w dr Titus Mould with critical care  He will evaluate patient due to sepsis and lactate >4 D/w cardiology dr Para March, made aware of EKG changes and also elevated lactate 11:05 AM Pt stabilized in the ER Awaiting consults/admission at this time  Labs Review Labs Reviewed  BASIC METABOLIC PANEL - Abnormal; Notable for the following:    Chloride 97 (*)    Glucose, Bld 200 (*)    BUN 23 (*)    Creatinine, Ser 1.85 (*)    GFR calc non Af Amer 31 (*)    GFR calc Af Amer 36 (*)     All other components within normal limits  CBC WITH DIFFERENTIAL/PLATELET - Abnormal; Notable for the following:    WBC 13.1 (*)    RBC 4.06 (*)    Hemoglobin 12.5 (*)    HCT 38.6 (*)    Neutro Abs 10.1 (*)    Monocytes Absolute 1.3 (*)    All other components within normal limits  TROPONIN I - Abnormal; Notable for the following:    Troponin I 1.74 (*)    All other components within normal limits  BRAIN NATRIURETIC PEPTIDE - Abnormal; Notable for the following:    B Natriuretic Peptide 601.4 (*)    All other components within normal limits  CULTURE, BLOOD (ROUTINE X 2)  CULTURE, BLOOD (ROUTINE X 2)  URINE CULTURE  URINALYSIS, ROUTINE W REFLEX MICROSCOPIC (NOT AT [facility])  I-STAT CG4 LACTIC ACID, ED    Imaging Review Dg Chest Portable 1 View  06/18/2015  CLINICAL DATA:  Shortness of breath, congestion and nausea for 3 days. EXAM: PORTABLE CHEST 1 VIEW COMPARISON:  06/28/2014 FINDINGS: New densities at the left lung base, particularly in the retrocardiac space. Post CABG changes in the chest. The right lung is clear. No evidence for pulmonary edema. Heart size is within normal limits and stable. Negative for a pneumothorax. Bony thorax is intact. IMPRESSION: New left basilar chest densities. Findings could represent atelectasis or infection. Electronically Signed   By: Markus Daft M.D.   On: 06/08/2015 08:56   I have personally reviewed and evaluated these images and lab results as part of my medical decision-making.   Medications  sodium chloride 0.9 % bolus 1,000 mL (0 mLs Intravenous Stopped 06/29/2015 0949)    And  sodium chloride 0.9 % bolus 1,000 mL (1,000 mLs Intravenous New Bag/Given 06/21/2015 1052)    And  sodium chloride 0.9 % bolus 500 mL (not administered)  azithromycin (ZITHROMAX) 500 mg in dextrose 5 % 250 mL IVPB (500 mg Intravenous New Bag/Given 06/22/2015 1049)  azithromycin (ZITHROMAX) 500 mg in dextrose 5 % 250 mL IVPB (not administered)  cefTRIAXone (ROCEPHIN) 2 g in  dextrose 5 % 50 mL IVPB (not administered)  albuterol (PROVENTIL) (2.5 MG/3ML) 0.083% nebulizer solution 5  mg (5 mg Nebulization Given 06/06/2015 0916)  cefTRIAXone (ROCEPHIN) 1 g in dextrose 5 % 50 mL IVPB (0 g Intravenous Stopped 06/14/2015 1023)   ED ECG REPORT    Date: 06/29/2015 [id number]  Rate: 57  Rhythm: normal sinus rhythm  QRS Axis: normal  Intervals: normal  ST/T Wave abnormalities: nonspecific ST changes  Conduction Disutrbances:TRIGEMINY  Narrative Interpretation:   Old EKG Reviewed: changes noted SIGNIFICANT ARTIFACT LIMITS EVALUATION OF THIS EKG I have personally reviewed the EKG tracing and agree with the computerized printout as noted.   EKG Interpretation  Date/Time:  Saturday Jun 10 2015 09:31:46 EDT Ventricular Rate:  60 PR Interval:    QRS Duration: 104 QT Interval:  479 QTC Calculation: 479 R Axis:   116 Text Interpretation:  Atrial fibrillation Left posterior fascicular block Probable anterior infarct, age indeterminate st depression noted in 1/avl Abnormal ekg Confirmed by Christy Gentles  MD, Preeya Cleckley ([id number]) on 07/03/2015 9:40:11 AM        MDM   Final diagnoses:  CAP (community acquired pneumonia)  Sepsis, due to unspecified organism ([place])  Non-STEMI (non-ST elevated myocardial infarction) ([place])  AKI (acute kidney injury) ([place])    Nursing notes including past medical history and social history reviewed and considered in documentation xrays/imaging reviewed by myself and considered during evaluation Labs/vital reviewed myself and considered during evaluation Previous records reviewed and considered - H/O DEMENTIA, H/O STROKE PER CHART     Ripley Fraise, MD 06/22/2015 1105

## 2015-06-10 NOTE — Progress Notes (Signed)
Pharmacy Antibiotic Note  [name] is a 82 y.o. male admitted on 06/08/2015 with pneumonia.  Pharmacy has been consulted for Rocephin and Azithromycin dosing.  First doses have been ordered.  These antibiotics do not need adjusted for renal function; pharmacy will sign off.    Plan: Rocephin 1g IV q24 Azithromycin 500mg  IV q24  Weight: 179 lb (81.194 kg)  Temp (24hrs), Avg:97.9 F (36.6 C), Min:97.9 F (36.6 C), Max:97.9 F (36.6 C)   Recent Labs Lab 06/25/2015 0854  WBC 13.1*    Estimated Creatinine Clearance: 34.3 mL/min (by C-G formula based on Cr of 1.46).    Allergies  Allergen Reactions  . Ramipril Other (See Comments)    REACTION: syncope  . Lorazepam Other (See Comments)  . Naproxen Sodium Itching and Rash    REACTION: rash all over  . Niacin Other (See Comments)    REACTION: unspecified  . Sulfonamide Derivatives Other (See Comments)    REACTION: unspecified (no HCTZ allergy)  . Zolpidem Tartrate Other (See Comments)     Thank you for allowing pharmacy to be a part of this patient's care.  Gracy Bruins, PharmD Clinical Pharmacist [facility]

## 2015-06-11 ENCOUNTER — Inpatient Hospital Stay (HOSPITAL_COMMUNITY): Payer: Medicare Other

## 2015-06-11 DIAGNOSIS — I2510 Atherosclerotic heart disease of native coronary artery without angina pectoris: Secondary | ICD-10-CM

## 2015-06-11 DIAGNOSIS — J9600 Acute respiratory failure, unspecified whether with hypoxia or hypercapnia: Secondary | ICD-10-CM | POA: Diagnosis present

## 2015-06-11 DIAGNOSIS — I214 Non-ST elevation (NSTEMI) myocardial infarction: Secondary | ICD-10-CM

## 2015-06-11 DIAGNOSIS — A419 Sepsis, unspecified organism: Principal | ICD-10-CM

## 2015-06-11 LAB — CBC
HEMATOCRIT: 35.4 % — AB (ref 39.0–52.0)
Hemoglobin: 11.4 g/dL — ABNORMAL LOW (ref 13.0–17.0)
MCH: 30.5 pg (ref 26.0–34.0)
MCHC: 32.2 g/dL (ref 30.0–36.0)
MCV: 94.7 fL (ref 78.0–100.0)
Platelets: 175 10*3/uL (ref 150–400)
RBC: 3.74 MIL/uL — ABNORMAL LOW (ref 4.22–5.81)
RDW: 15.7 % — AB (ref 11.5–15.5)
WBC: 12.3 10*3/uL — AB (ref 4.0–10.5)

## 2015-06-11 LAB — BASIC METABOLIC PANEL
ANION GAP: 10 (ref 5–15)
BUN: 18 mg/dL (ref 6–20)
CO2: 21 mmol/L — ABNORMAL LOW (ref 22–32)
Calcium: 8 mg/dL — ABNORMAL LOW (ref 8.9–10.3)
Chloride: 105 mmol/L (ref 101–111)
Creatinine, Ser: 1.4 mg/dL — ABNORMAL HIGH (ref 0.61–1.24)
GFR, EST AFRICAN AMERICAN: 50 mL/min — AB (ref >60)
GFR, EST NON AFRICAN AMERICAN: 43 mL/min — AB (ref >60)
Glucose, Bld: 154 mg/dL — ABNORMAL HIGH (ref 65–99)
POTASSIUM: 3.6 mmol/L (ref 3.5–5.1)
SODIUM: 136 mmol/L (ref 135–145)

## 2015-06-11 LAB — BLOOD GAS, ARTERIAL
Acid-base deficit: 7.7 mmol/L — ABNORMAL HIGH (ref 0.0–2.0)
Bicarbonate: 17.8 mEq/L — ABNORMAL LOW (ref 20.0–24.0)
Drawn by: 213381
FIO2: 1
O2 Saturation: 90.6 %
Patient temperature: 98.6
TCO2: 18.9 mmol/L (ref 0–100)
pCO2 arterial: 39 mmHg (ref 35.0–45.0)
pH, Arterial: 7.281 — ABNORMAL LOW (ref 7.350–7.450)
pO2, Arterial: 69.9 mmHg — ABNORMAL LOW (ref 80.0–100.0)

## 2015-06-11 LAB — GLUCOSE, CAPILLARY
GLUCOSE-CAPILLARY: 172 mg/dL — AB (ref 65–99)
GLUCOSE-CAPILLARY: 159 mg/dL — AB (ref 65–99)
GLUCOSE-CAPILLARY: 204 mg/dL — AB (ref 65–99)
GLUCOSE-CAPILLARY: 168 mg/dL — AB (ref 65–99)
Glucose-Capillary: 234 mg/dL — ABNORMAL HIGH (ref 65–99)

## 2015-06-11 LAB — URINE CULTURE

## 2015-06-11 LAB — BRAIN NATRIURETIC PEPTIDE: B Natriuretic Peptide: 1033.3 pg/mL — ABNORMAL HIGH (ref 0.0–100.0)

## 2015-06-11 LAB — LACTIC ACID, PLASMA
Lactic Acid, Venous: 6.9 mmol/L (ref 0.5–2.0)
Lactic Acid, Venous: 7.1 mmol/L (ref 0.5–2.0)

## 2015-06-11 LAB — TROPONIN I: TROPONIN I: 1.08 ng/mL — AB (ref <0.031)

## 2015-06-11 MED ORDER — HYDRALAZINE HCL 20 MG/ML IJ SOLN
5.0000 mg | Freq: Once | INTRAMUSCULAR | Status: AC
  Administered 2015-06-11: 5 mg via INTRAVENOUS
  Filled 2015-06-11: qty 1

## 2015-06-11 MED ORDER — BUDESONIDE 0.25 MG/2ML IN SUSP
0.2500 mg | Freq: Two times a day (BID) | RESPIRATORY_TRACT | Status: DC
  Administered 2015-06-11 – 2015-06-12 (×2): 0.25 mg via RESPIRATORY_TRACT
  Filled 2015-06-11 (×2): qty 2

## 2015-06-11 MED ORDER — HALOPERIDOL LACTATE 5 MG/ML IJ SOLN
0.5000 mg | Freq: Four times a day (QID) | INTRAMUSCULAR | Status: DC | PRN
  Administered 2015-06-11: 0.5 mg via INTRAVENOUS
  Filled 2015-06-11: qty 1

## 2015-06-11 MED ORDER — HALOPERIDOL LACTATE 5 MG/ML IJ SOLN
0.5000 mg | INTRAMUSCULAR | Status: DC | PRN
  Administered 2015-06-11 – 2015-06-12 (×3): 0.5 mg via INTRAVENOUS
  Filled 2015-06-11 (×4): qty 1

## 2015-06-11 MED ORDER — QUETIAPINE FUMARATE 25 MG PO TABS
12.5000 mg | ORAL_TABLET | Freq: Two times a day (BID) | ORAL | Status: DC
  Filled 2015-06-11: qty 1

## 2015-06-11 MED ORDER — IPRATROPIUM-ALBUTEROL 0.5-2.5 (3) MG/3ML IN SOLN
3.0000 mL | Freq: Four times a day (QID) | RESPIRATORY_TRACT | Status: DC
  Administered 2015-06-11 – 2015-06-12 (×5): 3 mL via RESPIRATORY_TRACT
  Filled 2015-06-11 (×4): qty 3

## 2015-06-11 MED ORDER — METHYLPREDNISOLONE SODIUM SUCC 125 MG IJ SOLR
60.0000 mg | Freq: Once | INTRAMUSCULAR | Status: AC
  Administered 2015-06-11: 60 mg via INTRAVENOUS
  Filled 2015-06-11: qty 2

## 2015-06-11 MED ORDER — HALOPERIDOL LACTATE 5 MG/ML IJ SOLN
2.0000 mg | Freq: Once | INTRAMUSCULAR | Status: AC
  Administered 2015-06-11: 2 mg via INTRAVENOUS
  Filled 2015-06-11: qty 1

## 2015-06-11 MED ORDER — DIGOXIN 0.25 MG/ML IJ SOLN
0.2500 mg | Freq: Every day | INTRAMUSCULAR | Status: DC
  Administered 2015-06-11 – 2015-06-12 (×3): 0.25 mg via INTRAVENOUS
  Filled 2015-06-11 (×3): qty 2

## 2015-06-11 MED ORDER — PIPERACILLIN-TAZOBACTAM 3.375 G IVPB
3.3750 g | Freq: Three times a day (TID) | INTRAVENOUS | Status: DC
  Administered 2015-06-11 – 2015-06-12 (×4): 3.375 g via INTRAVENOUS
  Filled 2015-06-11 (×6): qty 50

## 2015-06-11 MED ORDER — HALOPERIDOL LACTATE 5 MG/ML IJ SOLN
5.0000 mg | Freq: Once | INTRAMUSCULAR | Status: AC
  Administered 2015-06-11: 5 mg via INTRAVENOUS

## 2015-06-11 MED ORDER — MORPHINE SULFATE (PF) 2 MG/ML IV SOLN
0.5000 mg | INTRAVENOUS | Status: DC | PRN
  Administered 2015-06-11 – 2015-06-12 (×2): 0.5 mg via INTRAVENOUS
  Filled 2015-06-11 (×2): qty 1

## 2015-06-11 NOTE — Progress Notes (Signed)
Patient ID: [name], male   DOB: 08-16-1924, 82 y.o.   MRN: [id number]    Subjective:  Delirious   Objective:  Filed Vitals:   06/11/15 0041 06/11/15 0126 06/11/15 0501 06/11/15 0826  BP: 177/131 103/73 156/82 91/63  Pulse:  92 90 91  Temp:   98.3 F (36.8 C) 97.5 F (36.4 C)  TempSrc:   Oral Oral  Resp:  29 24 19   Height:      Weight:      SpO2:  89% 92% 91%    Intake/Output from previous day:  Intake/Output Summary (Last 24 hours) at 06/11/15 [id number] Last data filed at 06/11/15 [id number]  Gross per 24 hour  Intake 849.17 ml  Output    590 ml  Net 259.17 ml    Physical Exam: Confused  Chronically ill white male  HEENT: normal Neck supple with no adenopathy JVP normal no bruits no thyromegaly Lungs clear with no wheezing and good diaphragmatic motion Heart: S1/S2 no murmur, no rub, gallop or click Sternotomy and previous CEA  PMI normal Abdomen: benighn, BS positve, no tenderness, no AAA no bruit. No HSM or HJR Distal pulses intact with no bruits No edema Neuro non-focal Skin warm and dry No muscular weakness  Lab Results: Basic Metabolic Panel:  Recent Labs  06/25/2015 0854 06/11/2015 1441 06/11/15 0311  NA 135  --  136  K 3.8  --  3.6  CL 97*  --  105  CO2 24  --  21*  GLUCOSE 200*  --  154*  BUN 23*  --  18  CREATININE 1.85* 1.53* 1.40*  CALCIUM 9.1  --  8.0*   Liver Function Tests: No results for input(s): AST, ALT, ALKPHOS, BILITOT, PROT, ALBUMIN in the last 72 hours. No results for input(s): LIPASE, AMYLASE in the last 72 hours. CBC:  Recent Labs  06/20/2015 0854 07/04/2015 1441 06/11/15 0311  WBC 13.1* 11.6* 12.3*  NEUTROABS 10.1*  --   --   HGB 12.5* 12.5* 11.4*  HCT 38.6* 37.6* 35.4*  MCV 95.1 97.2 94.7  PLT 226 166 175   Cardiac Enzymes:  Recent Labs  06/06/2015 1440 06/19/2015 1906 06/11/15 0036  TROPONINI 1.65* 1.39* 1.08*   Thyroid Function Tests:  Recent Labs  07/01/2015 1440  TSH 5.015*    Imaging: Dg Chest  Port 1 View  06/23/2015  CLINICAL DATA:  Patient with wheezing. EXAM: PORTABLE CHEST 1 VIEW COMPARISON:  Chest radiograph 07/04/2015. FINDINGS: Monitoring leads overlie the patient. Stable cardiomegaly status post median sternotomy. Persistent elevation left hemidiaphragm with heterogeneous opacities left lung base. No pleural effusion or pneumothorax. IMPRESSION: Elevation left hemidiaphragm. Heterogeneous opacities left lung base may represent atelectasis or infection. Electronically Signed   By: Lovey Newcomer M.D.   On: 06/05/2015 16:43   Dg Chest Portable 1 View  06/09/2015  CLINICAL DATA:  Shortness of breath, congestion and nausea for 3 days. EXAM: PORTABLE CHEST 1 VIEW COMPARISON:  06/28/2014 FINDINGS: New densities at the left lung base, particularly in the retrocardiac space. Post CABG changes in the chest. The right lung is clear. No evidence for pulmonary edema. Heart size is within normal limits and stable. Negative for a pneumothorax. Bony thorax is intact. IMPRESSION: New left basilar chest densities. Findings could represent atelectasis or infection. Electronically Signed   By: Markus Daft M.D.   On: 06/11/2015 08:56    Cardiac Studies:  ECG: SR lateral T wave inversions    Telemetry:  NSR 06/11/2015  Echo: pending   Medications:   . aspirin EC  325 mg Oral Daily  . azithromycin  500 mg Intravenous Q24H  . cefTRIAXone (ROCEPHIN)  IV  1 g Intravenous Q24H  . heparin  5,000 Units Subcutaneous Q8H  . insulin aspart  0-15 Units Subcutaneous TID WC  . memantine  10 mg Oral BID  . pantoprazole (PROTONIX) IV  40 mg Intravenous Q24H  . rosuvastatin  20 mg Oral Daily  . sodium chloride  500 mL Intravenous Once  . vancomycin  1,000 mg Intravenous Q24H     . sodium chloride 50 mL/hr at 06/11/15 0914    Assessment/Plan:  Elevated Troponin: No chest pain. ECG with possible inferolateral ischemia. Distant CABG 35 Given age , dementia and DNR status not a candidate for invasive  evaluation. ASA no evolution in enzymes this am  Pneumonia: Some hypoxia in ER Ceftriaxone and azithromycin per primary service MS: Long standing dementia likely worse from infection or ischemic event. Agree with DNR Hydrate Family feeding on anxiety and delirium   Jenkins Rouge 06/11/2015, 9:22 AM

## 2015-06-11 NOTE — Progress Notes (Signed)
Pharmacy Antibiotic Note  [name] is a 82 y.o. male admitted on 06/14/2015 with sepsis.  Pharmacy was consulted for Rocephin and Azithromycin dosing. Pharmacy was subsequently consulted for vancomycin and zosyn dosing due to patient becoming septic and showing no improvement. WBC elevated to 12.3, patient is afebrile, lactic acid elevated to 3.1.   Plan: Vancomycin 1 g IV q24h Zosyn 3.375 g IV q8h Monitor renal function, cultures, and VT prn  Height: 5\' 4"  (162.6 cm) Weight: 190 lb 11.2 oz (86.5 kg) IBW/kg (Calculated) : 59.2  Temp (24hrs), Avg:97.7 F (36.5 C), Min:97.1 F (36.2 C), Max:98.3 F (36.8 C)   Recent Labs Lab 06/25/2015 0854 07/03/2015 1441 06/09/2015 1444 07/04/2015 1928 06/11/15 0311  WBC 13.1* 11.6*  --   --  12.3*  CREATININE 1.85* 1.53*  --   --  1.40*  LATICACIDVEN  --   --  2.5* 3.1*  --     Estimated Creatinine Clearance: 36.2 mL/min (by C-G formula based on Cr of 1.4).    Allergies  Allergen Reactions  . Ramipril Other (See Comments)    REACTION: syncope  . Lorazepam Other (See Comments)  . Naproxen Sodium Itching and Rash    REACTION: rash all over  . Niacin Other (See Comments)    REACTION: unspecified  . Sulfonamide Derivatives Other (See Comments)    REACTION: unspecified (no HCTZ allergy)  . Zolpidem Tartrate Other (See Comments)   Antibiotics: Vanc 5/6 >> CTX 5/6 >> 5/7 Azithro 5/6 >> 5/7 Zosyn 5/7 >>  Cultures: 5/6 BCx:  5/6 UCx: 5/6 MRSA PCR negative   Thank you for allowing pharmacy to be a part of this patient's care.  Joya San, PharmD Clinical Pharmacy Resident Pager # [phone] 06/11/2015 1:49 PM

## 2015-06-11 NOTE — Progress Notes (Addendum)
Patient HR 140-170s. Afib RVR. EKG obtained. Cardiology paged. Dr. Beckie Salts returned page. New orders received.

## 2015-06-11 NOTE — Progress Notes (Signed)
Patient very combative and violent with staff and daughter. Pt began kicking and flailing at staff, staff has constantly attempted to redirect pt and calm him down, but has not worked.  Patient keeps removing oxygen which is causing patient to desat and become SOB and began to wheeze. Pt is unable to comprehend what we are saying to him and continues to fight. Nurse paged MD for restraints. Order given by MD.  Daughter at bedside and educated. Applied by by nurse and will continue to monitor per protocol.

## 2015-06-11 NOTE — Progress Notes (Signed)
PROGRESS NOTE    [name]  [id number] DOB: 09/05/1924 DOA: 06/05/2015 PCP: [name], MD   Outpatient Specialists:    Brief Narrative: 82 y.o. male with significant past history of Alzheimer's dementia described with gait disturbances and progressive cognitive decline as well as a history of coronary artery disease, status post past post 4 vessel CABG presents to our ER for worsening inability to walk, as well as lower extremity weakness and confusion. Of note, patient here in the room with his 2 sons, one of whom lives with the patient. Pt's hx of Alzheimer's dementia limits history, most history obtained by family members. Pt states he is otherwise fine and does not recall why he is here.  Per Family, pt was in normal state of health for him until about 3 days ago. His Mentation has worsened with confusion and visual hallucinations. + bilat LE weakness and inability to bear weight. Family notes hypoxia with home 02 sats, ranging in the mid 70s to mid 80s in last couple of days. Pt is not on home O2 and they typically do not check pulse ox. Patient has not had any fevers or cough, no sick contacts, no recent travels. Pt's appetite has been very good though, and eating like normal per family. Pt has not c/o of chest pains to them nor does he complain to me of doe or CP. Per family, bms have been normal, last bm this am. Pt is confirmed to be DNR/DNI w/ family.  In ED, pt noted hypotensive, tachycardic, found to have new left basilar opacities, as well as afib rate 60s, w/ st depressions in I/avl. Pt was started on 30mg /kg fluid bolus per sepsis protocol per ED, treated for CAP w/ rocephin and azithromycin, and was seen by Critical Care Team and Cardiologist.  Assessment & Plan:   Principal Problem:  Encephalopathy, toxic versus metabolic versus combined   Left lower lobe opacification, possible CAP (community acquired pneumonia) versus aspiration pneumonia    SIRS   SOB/Hypotension, cannot rule out NSTEMI/Sepsis syndrome    Elevated troponin    Respiratory failure, acute    Wheezing, cannot rule out reactive airway disease versus undiagnosed COPD with exacerbation (Prior history of tobacco use) (Guarded prognosis - PATIENT IS VERY SICK)  Active Problems:  Diabetes mellitus without complication ([place])  Dementia  Coronary atherosclerosis  CKD stage 3  Pneumonia  - Sepsis/SIRS : Leukocytosis noted, as well hypotension on admission with lactic acidosis. No history of fever or chills noted. CXR finding is noted (retrocardiac opacity/Opacification of left lower lobe). CT chest without contrast. On antibiotics. Repeat lactic acid. Patient is at risk for cardiac events.  -Possible pneumonia - Can not rule out aspiration. Follow CT chest without contrast. Continue antibiotics for now.   -SOB/Elevated troponin - May indicate severity of illness, including effect of hypotension versus NSTEMI. Cardiology input is appreciated. Patient is also wheezing with tight chest. Guarded prognosis.    - Atrial fib, currently rate controlled - holding home Xarelto currenlty, given possible further intervention.   - CKD - Scr seems to be at baseline.  - Encephalopathy, toxic versus metabolic - Work up and manage underlying process documented above.  - History of valvular heart disease and Pulmonary Hypertension - Follow ECHO. Low threshold to consult palliative/Hospice team if worsening valvular findings are noted.  -Acute respiratory failure/Wheezing - Possibly acute reactive airway disease versus undiagnosed COPD with exacerbation versus cardiac asthma. Careful with fluids. Neb treatment. Manage expectantly.  6. Dm w/  hyperglycemia - chk hba1c, accuchks, riss for now.  -. Htn, currently hypotensive. Holding home antihypertensives (metoprolol and lasix), ivf for now.  -. Alzheimer's dementia - continue Namenda as able.     DVT prophylaxis:  [place]  Heparin Code Status: DNR Family Communication: Son Disposition Plan: Hospital course will dictate   Consultants:  ICU and Intensive care team.  Antimicrobials: Vanc/Rocephin and Zithromax. Continue to reassess and adjust accordingly.  Subjective: No History from patient. Seen alongside patient's son.  Objective: Filed Vitals:   06/11/15 0501 06/11/15 0826 06/11/15 1039 06/11/15 1040  BP: 156/82 91/63 109/72   Pulse: 90 91 105   Temp: 98.3 F (36.8 C) 97.5 F (36.4 C)    TempSrc: Oral Oral    Resp: 24 19 28    Height:      Weight:      SpO2: 92% 91% 94% 96%    Intake/Output Summary (Last 24 hours) at 06/11/15 1044 Last data filed at 06/11/15 0826  Gross per 24 hour  Intake 849.17 ml  Output    590 ml  Net 259.17 ml   Filed Weights   06/18/2015 0932 06/28/2015 1455  Weight: 81.194 kg (179 lb) 86.5 kg (190 lb 11.2 oz)    Examination:  General exam: Awake. Tries to get up from bed.  Respiratory system: Decreased air entry globally. Wheezing. Cardiovascular system: S1 & S2   Gastrointestinal system: Abdomen is nondistended, soft and nontender.   Central nervous system: Alert. Moves all limbs. Extremities: No significant edema noted   Data Reviewed: I have personally reviewed following labs and imaging studies  CBC:  Recent Labs Lab 06/28/2015 0854 06/26/2015 1441 06/11/15 0311  WBC 13.1* 11.6* 12.3*  NEUTROABS 10.1*  --   --   HGB 12.5* 12.5* 11.4*  HCT 38.6* 37.6* 35.4*  MCV 95.1 97.2 94.7  PLT 226 166 0000000   Basic Metabolic Panel:  Recent Labs Lab 06/12/2015 0854 06/09/2015 1441 06/11/15 0311  NA 135  --  136  K 3.8  --  3.6  CL 97*  --  105  CO2 24  --  21*  GLUCOSE 200*  --  154*  BUN 23*  --  18  CREATININE 1.85* 1.53* 1.40*  CALCIUM 9.1  --  8.0*   GFR: Estimated Creatinine Clearance: 36.2 mL/min (by C-G formula based on Cr of 1.4). Liver Function Tests: No results for input(s): AST, ALT, ALKPHOS, BILITOT, PROT, ALBUMIN in the last 168  hours. No results for input(s): LIPASE, AMYLASE in the last 168 hours. No results for input(s): AMMONIA in the last 168 hours. Coagulation Profile: No results for input(s): INR, PROTIME in the last 168 hours. Cardiac Enzymes:  Recent Labs Lab 06/18/2015 0854 06/24/2015 1440 06/19/2015 1906 06/11/15 0036  TROPONINI 1.74* 1.65* 1.39* 1.08*   BNP (last 3 results) No results for input(s): PROBNP in the last 8760 hours. HbA1C: No results for input(s): HGBA1C in the last 72 hours. CBG:  Recent Labs Lab 06/07/2015 1705 07/02/2015 2215 06/11/15 0841  GLUCAP 116* 131* 159*   Lipid Profile: No results for input(s): CHOL, HDL, LDLCALC, TRIG, CHOLHDL, LDLDIRECT in the last 72 hours. Thyroid Function Tests:  Recent Labs  06/08/2015 1440  TSH 5.015*   Anemia Panel: No results for input(s): VITAMINB12, FOLATE, FERRITIN, TIBC, IRON, RETICCTPCT in the last 72 hours. Urine analysis:    Component Value Date/Time   COLORURINE YELLOW 06/08/2015 1000   APPEARANCEUR CLEAR 07/02/2015 1000   LABSPEC 1.011 06/11/2015 1000   PHURINE  6.0 06/16/2015 1000   GLUCOSEU NEGATIVE 07/05/2015 1000   HGBUR NEGATIVE 07/01/2015 1000   BILIRUBINUR NEGATIVE 06/29/2015 1000   KETONESUR NEGATIVE 06/29/2015 1000   PROTEINUR NEGATIVE 06/11/2015 1000   UROBILINOGEN 1.0 06/28/2014 1227   NITRITE NEGATIVE 06/20/2015 1000   LEUKOCYTESUR NEGATIVE 06/09/2015 1000   Sepsis Labs: @LABRCNTIP (procalcitonin:4,lacticidven:4)  ) Recent Results (from the past 240 hour(s))  MRSA PCR Screening     Status: None   Collection Time: 06/14/2015  3:00 PM  Result Value Ref Range Status   MRSA by PCR NEGATIVE NEGATIVE Final    Comment:        The GeneXpert MRSA Assay (FDA approved for NASAL specimens only), is one component of a comprehensive MRSA colonization surveillance program. It is not intended to diagnose MRSA infection nor to guide or monitor treatment for MRSA infections.          Radiology Studies: Dg Chest  Port 1 View  07/01/2015  CLINICAL DATA:  Patient with wheezing. EXAM: PORTABLE CHEST 1 VIEW COMPARISON:  Chest radiograph 06/30/2015. FINDINGS: Monitoring leads overlie the patient. Stable cardiomegaly status post median sternotomy. Persistent elevation left hemidiaphragm with heterogeneous opacities left lung base. No pleural effusion or pneumothorax. IMPRESSION: Elevation left hemidiaphragm. Heterogeneous opacities left lung base may represent atelectasis or infection. Electronically Signed   By: Lovey Newcomer M.D.   On: 06/21/2015 16:43   Dg Chest Portable 1 View  06/19/2015  CLINICAL DATA:  Shortness of breath, congestion and nausea for 3 days. EXAM: PORTABLE CHEST 1 VIEW COMPARISON:  06/28/2014 FINDINGS: New densities at the left lung base, particularly in the retrocardiac space. Post CABG changes in the chest. The right lung is clear. No evidence for pulmonary edema. Heart size is within normal limits and stable. Negative for a pneumothorax. Bony thorax is intact. IMPRESSION: New left basilar chest densities. Findings could represent atelectasis or infection. Electronically Signed   By: Markus Daft M.D.   On: 06/08/2015 08:56        Scheduled Meds: . aspirin EC  325 mg Oral Daily  . azithromycin  500 mg Intravenous Q24H  . budesonide (PULMICORT) nebulizer solution  0.25 mg Nebulization BID  . cefTRIAXone (ROCEPHIN)  IV  1 g Intravenous Q24H  . heparin  5,000 Units Subcutaneous Q8H  . insulin aspart  0-15 Units Subcutaneous TID WC  . ipratropium-albuterol  3 mL Nebulization Q6H  . memantine  10 mg Oral BID  . methylPREDNISolone (SOLU-MEDROL) injection  60 mg Intravenous Once  . pantoprazole (PROTONIX) IV  40 mg Intravenous Q24H  . QUEtiapine  12.5 mg Oral BID  . rosuvastatin  20 mg Oral Daily  . sodium chloride  500 mL Intravenous Once  . vancomycin  1,000 mg Intravenous Q24H   Continuous Infusions: . sodium chloride 50 mL/hr at 06/11/15 0914     LOS: 1 day    Time spent: [place], MD [facility] Pager [phone]  If 7PM-7AM, please contact night-coverage [email or web address] Password [facility] 06/11/2015, 10:44 AM

## 2015-06-11 NOTE — Progress Notes (Signed)
PT Cancellation Note  Patient Details Name: [name] MRN: [id number] DOB: 26-Jan-1925   Cancelled Treatment:    Reason Eval/Treat Not Completed: Medical issues which prohibited therapy   Discussed case with [place], RN;  Currently decr O2 sats, on a mask, agitated;   Will follow up later today as time allows;  Otherwise, will follow up for PT tomorrow;   Thank you,  Roney [name], PT  Acute Rehabilitation Services Pager [phone] Office [phone]     Roney [name] [facility] 06/11/2015, 10:22 AM

## 2015-06-11 NOTE — Progress Notes (Signed)
Pt becoming increasingly confused and mildly combative with daughter and staff. Pt is extremely restless and hallucinating. Paged M. Lynch and orders received.

## 2015-06-11 NOTE — Progress Notes (Signed)
CRITICAL VALUE ALERT  Critical value received:  Lactic acid  Date of notification:  06/11/15  Time of notification:  [id number]  Critical value read back:Yes.    Nurse who received alert:  Brooklynn Brandenburg  MD notified (1st page):  Dr. Marthenia Rolling  Time of first page:  1352  Responding MD:  Dr. Marcie Bal

## 2015-06-12 ENCOUNTER — Inpatient Hospital Stay (HOSPITAL_COMMUNITY): Payer: Medicare Other

## 2015-06-12 DIAGNOSIS — R0600 Dyspnea, unspecified: Secondary | ICD-10-CM

## 2015-06-12 DIAGNOSIS — Z515 Encounter for palliative care: Secondary | ICD-10-CM

## 2015-06-12 DIAGNOSIS — Z66 Do not resuscitate: Secondary | ICD-10-CM

## 2015-06-12 LAB — LEGIONELLA PNEUMOPHILA SEROGP 1 UR AG: L. PNEUMOPHILA SEROGP 1 UR AG: NEGATIVE

## 2015-06-12 LAB — PROCALCITONIN: Procalcitonin: 0.13 ng/mL

## 2015-06-12 LAB — GLUCOSE, CAPILLARY
Glucose-Capillary: 145 mg/dL — ABNORMAL HIGH (ref 65–99)
Glucose-Capillary: 159 mg/dL — ABNORMAL HIGH (ref 65–99)

## 2015-06-12 LAB — HEMOGLOBIN A1C
HEMOGLOBIN A1C: 6.8 % — AB (ref 4.8–5.6)
MEAN PLASMA GLUCOSE: 148 mg/dL

## 2015-06-12 MED ORDER — IPRATROPIUM-ALBUTEROL 0.5-2.5 (3) MG/3ML IN SOLN: 3.0000 mL | RESPIRATORY_TRACT | Status: DC | PRN

## 2015-06-12 MED ORDER — GLYCOPYRROLATE 0.2 MG/ML IJ SOLN
0.1000 mg | INTRAMUSCULAR | Status: DC | PRN
  Administered 2015-06-12: 0.1 mg via INTRAVENOUS
  Filled 2015-06-12: qty 1

## 2015-06-12 MED ORDER — HALOPERIDOL LACTATE 5 MG/ML IJ SOLN: 1.0000 mg | INTRAMUSCULAR | Status: DC | PRN

## 2015-06-12 MED ORDER — CETYLPYRIDINIUM CHLORIDE 0.05 % MT LIQD
7.0000 mL | Freq: Two times a day (BID) | OROMUCOSAL | Status: DC
  Administered 2015-06-12: 7 mL via OROMUCOSAL

## 2015-06-12 MED ORDER — MORPHINE SULFATE (PF) 2 MG/ML IV SOLN
2.0000 mg | INTRAVENOUS | Status: DC | PRN
  Administered 2015-06-12 – 2015-06-13 (×2): 2 mg via INTRAVENOUS

## 2015-06-12 MED ORDER — LORAZEPAM 2 MG/ML IJ SOLN
1.0000 mg | INTRAMUSCULAR | Status: DC | PRN
  Filled 2015-06-12: qty 1

## 2015-06-12 MED ORDER — MORPHINE SULFATE (PF) 2 MG/ML IV SOLN
1.0000 mg | INTRAVENOUS | Status: DC | PRN
  Administered 2015-06-12 (×3): 1 mg via INTRAVENOUS
  Filled 2015-06-12 (×3): qty 1

## 2015-06-12 MED ORDER — MORPHINE SULFATE 25 MG/ML IV SOLN
1.0000 mg/h | INTRAVENOUS | Status: DC
  Administered 2015-06-12: 1 mg/h via INTRAVENOUS
  Filled 2015-06-12: qty 10

## 2015-06-12 MED ORDER — CHLORHEXIDINE GLUCONATE 0.12 % MT SOLN
15.0000 mL | Freq: Two times a day (BID) | OROMUCOSAL | Status: DC
  Administered 2015-06-12: 15 mL via OROMUCOSAL
  Filled 2015-06-12: qty 15

## 2015-06-12 MED ORDER — CHLORHEXIDINE GLUCONATE 0.12 % MT SOLN: 15.0000 mL | Freq: Two times a day (BID) | OROMUCOSAL | Status: DC | PRN

## 2015-06-12 MED ORDER — CETYLPYRIDINIUM CHLORIDE 0.05 % MT LIQD: 7.0000 mL | OROMUCOSAL | Status: DC | PRN

## 2015-06-12 NOTE — Consult Note (Signed)
Consultation Note Date: 06/12/2015   Patient Name: [name]  DOB: [birth year]  MRN: [id number]  Age / Sex: 82 y.o., male  PCP: Tonia Ghent, MD Referring Physician: Cherene Altes, MD  Reason for Consultation: Establishing goals of care and Psychosocial/spiritual support  HPI/Patient Profile: 82 y.o. male  Admitted on  06/12/2015 with   [name] is a 82 y.o. male with significant past history of Alzheimer's worsening dementia described with gait disturbances and progressive cognitive decline as well as a history of coronary artery disease, status post past post 4 vessel CABG.  Per family continued physical, functional and cognitive decline.      Per Family, pt was in normal state of health for him until about 3 days ago. His Mentation has worsened with confusion and visual hallucinations. + bilat LE weakness and inability to bear weight.Pt is confirmed to be DNR/DNI w/ family.  In ED, pt noted hypotensive, tachycardic, found to have new left basilar opacities, as well as afib rate 60s, w/ st depressions in I/avl. Pt was started on 30mg /kg fluid bolus per sepsis protocol per ED, treated for CAP w/ rocephin and azithromycin, and was seen by Critical Care Team and Cardiologist.  Family faced with advanced directive and EOL decisions   Clinical Assessment and Goals of Care:   This NP Wadie Lessen reviewed medical records, received report from team, assessed the patient and then meet at the patient's bedside along with [name] and daughter [name]  to discuss diagnosis, prognosis, GOC, EOL wishes disposition and options.   A detailed discussion was had today regarding advanced directives.  Concepts specific to code status, artifical feeding and hydration, continued IV antibiotics and rehospitalization was had.  The difference between a aggressive medical intervention path  and a  palliative comfort care path for this patient at this time was had.  Values and goals of care important to patient and family were attempted to be elicited.  Concept of Hospice and Palliative Care were discussed  Natural trajectory and expectations at EOL were discussed.  Questions and concerns addressed. Family encouraged to call with questions or concerns.  PMT will continue to support holistically.   HCPOA, with support of entire family    SUMMARY OF RECOMMENDATIONS    Code Status/Advance Care Planning:  DNR    Symptom Management:   Dyspnea/Pain: Morphine 1 mg IV every 1 hr prn  Agitation: Haldol 1 mg IV every 3 hrs prn  Palliative Prophylaxis:   Aspiration, Bowel Regimen, Delirium Protocol, Eye Care, Frequent Pain Assessment, Oral Care and Turn Reposition  Additional Recommendations (Limitations, Scope, Preferences):  - Family verbalizing understanding of poor prognosis, await input today from attending and then re-meet tonight with this NP with entire family to make unified decisions at this time of EOL.  Focus is comfort  Psycho-social/Spiritual:    Desire for further Chaplaincy support-declined at this time Additional Recommendations: Information on Hospice Prognosis:   Hours - Days  Discharge Planning: Anticipated Hospital Death  Primary Diagnoses: Present on Admission:  . CAP (community acquired pneumonia) . Dementia . Coronary atherosclerosis . Pneumonia . Sepsis associated hypotension ([place])  I have reviewed the medical record, interviewed the patient and family, and examined the patient. The following aspects are pertinent.  Past Medical History  Diagnosis Date  . Anxiety   . Hypertension   . Hyperlipidemia   . CAD (coronary artery disease)   . Dementia   . Gout, unspecified 2012    crystal analysis at urgent care  . AF (atrial fibrillation) ([place])   . 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 Cancer ([place])  02/25/11    Squamous Cell Carcinoma- Left Dorsum Hand  . Type II or unspecified type diabetes mellitus without mention of complication, not stated as uncontrolled 06/18/2006  . Stroke ([place]) 07/13/11    Acute left parietal subcortical and periventricular white matter infarction.  . Carotid artery occlusion   . Alzheimer's dementia    Social History   Social History  . Marital Status: Widowed    Spouse Name: N/A  . Number of Children: 5  . Years of Education: 12   Occupational History  . Retired from [employer]    Social History Main Topics  . Smoking status: Former Smoker    Types: Cigarettes  . Smokeless tobacco: Never Used     Comment: Remote mild smoking history  . Alcohol Use: No  . Drug Use: No  . Sexual Activity: Not Asked   Other Topics Concern  . None   Social History Narrative   Son lives with patient, helps out at home.   Drinks caffeine occasionally   Drinks a fair amount of tea    Family History  Problem Relation Age of Onset  . Colon cancer Sister   . Hypertension Brother   . Cancer Brother     intestine  . Brain cancer Brother   . Melanoma Sister   . Coronary artery disease Son     42, heart attack  . Diabetes Son   . Heart disease Son     After 49 yrs of age  . Hypertension Son   . Heart attack Son   . Other Mother     Intestinal  Blockage  . Other Father     Intestinal Blockage  . Diabetes Daughter   . Hypertension Daughter   . Hypertension Son    Scheduled Meds: . antiseptic oral rinse  7 mL Mouth Rinse q12n4p  . aspirin EC  325 mg Oral Daily  . budesonide (PULMICORT) nebulizer solution  0.25 mg Nebulization BID  . chlorhexidine  15 mL Mouth Rinse BID  . digoxin  0.25 mg Intravenous Daily  . heparin  5,000 Units Subcutaneous Q8H  . insulin aspart  0-15 Units Subcutaneous TID WC  . ipratropium-albuterol  3 mL Nebulization Q6H  . memantine  10 mg Oral BID  . pantoprazole (PROTONIX) IV  40 mg Intravenous Q24H  . piperacillin-tazobactam  (ZOSYN)  IV  3.375 g Intravenous Q8H  . QUEtiapine  12.5 mg Oral BID  . rosuvastatin  20 mg Oral Daily  . sodium chloride  500 mL Intravenous Once  . vancomycin  1,000 mg Intravenous Q24H   Continuous Infusions: . sodium chloride 50 mL/hr at 06/12/15 0741   PRN Meds:.albuterol, haloperidol lactate, ipratropium-albuterol, morphine injection Medications Prior to Admission:  Prior to Admission medications   Medication Sig Start Date End Date Taking? Authorizing Provider  acetaminophen (TYLENOL) 500  MG tablet Take 500 mg by mouth once.   Yes Historical Provider, MD  allopurinol (ZYLOPRIM) 100 MG tablet take 1 tablet by mouth once daily 01/05/15  Yes Tonia Ghent, MD  ALPRAZolam Duanne Moron) 0.25 MG tablet take 1 tab by mouth twice daily Patient taking differently: Take 0.25 mg by mouth 2 (two) times daily as needed for anxiety.  06/01/15  Yes Tonia Ghent, MD  furosemide (LASIX) 40 MG tablet Take 1 tablet (40 mg total) by mouth daily. 01/18/15  Yes Tonia Ghent, MD  memantine (NAMENDA) 10 MG tablet Take 1 tablet (10 mg total) by mouth 2 (two) times daily. 01/18/15  Yes Tonia Ghent, MD  metoprolol (LOPRESSOR) 50 MG tablet Take 50 mg by mouth 2 (two) times daily.   Yes Historical Provider, MD  pantoprazole (PROTONIX) 40 MG tablet Take 1 tablet (40 mg total) by mouth 2 (two) times daily. 01/18/15  Yes Tonia Ghent, MD  rosuvastatin (CRESTOR) 20 MG tablet take 1 tablet by mouth once daily Patient taking differently: take 1 tablet by mouth once daily in evening 01/10/15  Yes Tonia Ghent, MD  sertraline (ZOLOFT) 50 MG tablet Take 1 tablet (50 mg total) by mouth daily. Patient taking differently: Take 50 mg by mouth at bedtime.  01/18/15  Yes Tonia Ghent, MD  XARELTO 15 MG TABS tablet take 1 tablet by mouth once daily 09/05/14  Yes Tonia Ghent, MD  acetaminophen (TYLENOL) 325 MG tablet Take 650 mg by mouth 3 (three) times daily.    Historical Provider, MD   Allergies  Allergen  Reactions  . Ramipril Other (See Comments)    REACTION: syncope  . Lorazepam Other (See Comments)  . Naproxen Sodium Itching and Rash    REACTION: rash all over  . Niacin Other (See Comments)    REACTION: unspecified  . Sulfonamide Derivatives Other (See Comments)    REACTION: unspecified (no HCTZ allergy)  . Zolpidem Tartrate Other (See Comments)   Review of Systems  Unable to perform ROS: Patient unresponsive    Physical Exam  Constitutional: He appears well-developed. He appears ill. Face mask in place.  Cardiovascular: Tachycardia present.   Pulmonary/Chest: He has decreased breath sounds.  Neurological: He is unresponsive.  Skin: Skin is warm and dry.    Vital Signs: BP 97/72 mmHg  Pulse 121  Temp(Src) 97.4 F (36.3 C) (Axillary)  Resp 18  Ht 5\' 4"  (1.626 m)  Wt 86.5 kg (190 lb 11.2 oz)  BMI 32.72 kg/m2  SpO2 92% Pain Assessment: PAINAD POSS *See Group Information*: 1-Acceptable,Awake and alert Pain Score: 0-No pain   SpO2: SpO2: 92 % O2 Device:SpO2: 92 % O2 Flow Rate: .O2 Flow Rate (L/min): 15 L/min  IO: Intake/output summary:  Intake/Output Summary (Last 24 hours) at 06/12/15 1045 Last data filed at 06/12/15 0741  Gross per 24 hour  Intake   1550 ml  Output    425 ml  Net   1125 ml    LBM: Last BM Date: 06/12/2015 Baseline Weight: Weight: 81.194 kg (179 lb) Most recent weight: Weight: 86.5 kg (190 lb 11.2 oz)     Palliative Assessment/Data: 20 % at best   Discussed with Dr Thereasa Solo  Time In:  0900 Time Out: 1015  Time Total: 75 min Greater than 50%  of this time was spent counseling and coordinating care related to the above assessment and plan.  Signed by: Wadie Lessen, NP   Please contact Palliative Medicine Team phone  at [phone] for questions and concerns.  For individual provider: See Amion  This NP meet with entire family at 6 pm, answered questions and addressed concerns. AL understand that prognosis is likely hours to days.  Hope is  for comfort , quality and dignity.

## 2015-06-12 NOTE — Progress Notes (Signed)
SUBJECTIVE:  Minimally responsive.  Arousable   PHYSICAL EXAM Filed Vitals:   06/12/15 0247 06/12/15 0255 06/12/15 0706 06/12/15 0718  BP:  104/55  97/72  Pulse:  133  121  Temp: 98.1 F (36.7 C)   97.4 F (36.3 C)  TempSrc: Axillary   Axillary  Resp:  23  18  Height:      Weight:      SpO2:  92% 99% 92%   General:  Somnolent with acute on chronic illness Lungs:  Decreased breath sounds bilaterally.  Heart:  Irregular Abdomen:  Decreased bowel sounds Extremities:  Trace edema   LABS: Lab Results  Component Value Date   TROPONINI 1.08* 06/11/2015   Results for orders placed or performed during the hospital encounter of 06/22/2015 (from the past 24 hour(s))  Glucose, capillary     Status: Abnormal   Collection Time: 06/11/15  8:41 AM  Result Value Ref Range   Glucose-Capillary 159 (H) 65 - 99 mg/dL  Blood gas, arterial     Status: Abnormal   Collection Time: 06/11/15 11:44 AM  Result Value Ref Range   FIO2 1.00    Delivery systems NON-REBREATHER OXYGEN MASK    pH, Arterial 7.281 (L) 7.350 - 7.450   pCO2 arterial 39.0 35.0 - 45.0 mmHg   pO2, Arterial 69.9 (L) 80.0 - 100.0 mmHg   Bicarbonate 17.8 (L) 20.0 - 24.0 mEq/L   TCO2 18.9 0 - 100 mmol/L   Acid-base deficit 7.7 (H) 0.0 - 2.0 mmol/L   O2 Saturation 90.6 %   Patient temperature 98.6    Collection site ARTERIAL DRAW    Drawn by [id number]    Sample type ARTERIAL DRAW    Allens test (pass/fail) PASS PASS  Lactic acid, plasma     Status: Abnormal   Collection Time: 06/11/15 12:18 PM  Result Value Ref Range   Lactic Acid, Venous 6.9 (HH) 0.5 - 2.0 mmol/L  Brain natriuretic peptide     Status: Abnormal   Collection Time: 06/11/15 12:18 PM  Result Value Ref Range   B Natriuretic Peptide 1033.3 (H) 0.0 - 100.0 pg/mL  Glucose, capillary     Status: Abnormal   Collection Time: 06/11/15 12:34 PM  Result Value Ref Range   Glucose-Capillary 234 (H) 65 - 99 mg/dL  Lactic acid, plasma     Status: Abnormal   Collection Time: 06/11/15  4:14 PM  Result Value Ref Range   Lactic Acid, Venous 7.1 (HH) 0.5 - 2.0 mmol/L  Glucose, capillary     Status: Abnormal   Collection Time: 06/11/15  4:52 PM  Result Value Ref Range   Glucose-Capillary 204 (H) 65 - 99 mg/dL  Glucose, capillary     Status: Abnormal   Collection Time: 06/11/15  9:08 PM  Result Value Ref Range   Glucose-Capillary 168 (H) 65 - 99 mg/dL  Procalcitonin     Status: None   Collection Time: 06/12/15  4:38 AM  Result Value Ref Range   Procalcitonin 0.13 ng/mL  Glucose, capillary     Status: Abnormal   Collection Time: 06/12/15  7:34 AM  Result Value Ref Range   Glucose-Capillary 145 (H) 65 - 99 mg/dL    Intake/Output Summary (Last 24 hours) at 06/12/15 [id number] Last data filed at 06/12/15 0741  Gross per 24 hour  Intake   1550 ml  Output    425 ml  Net   1125 ml     ASSESSMENT AND PLAN:   ELEVATED  TROPONIN:  Nonspecific elevation in this situation.  Conservative therapy.  ASA only therapy at present.   ATRIAL FIB:   Telemetry reviewed.  Rapid rates and wide complex noted.  Hypotension and acute on chronic illnesses makes further rate management difficult.  Rapid rate is not the primary but rather a secondary problem.   Need to establish goals of care.  If he is to be full care then consider IV amiodarone for rate control.   Minus Breeding 06/12/2015 8:22 AM

## 2015-06-12 NOTE — Progress Notes (Signed)
SLP Cancellation Note  Patient Details Name: ISHAN GIAMMANCO MRN: [id number] DOB: [birth year]   Cancelled treatment:       Reason Eval/Treat Not Completed: Medical issues which prohibited therapy. Per discussion with RN, pt not yet ready for swallow evaluation and is pending palliative care consult this afternoon. Will hold evaluation at this time.   Germain Osgood, M.A. CCC-SLP [phone]  Germain Osgood 06/12/2015, 2:57 PM

## 2015-06-12 NOTE — Progress Notes (Signed)
PT Cancellation Note  Patient Details Name: [name] MRN: [id number] DOB: [birth year]   Cancelled Treatment:    Reason Eval/Treat Not Completed: Medical issues which prohibited therapy; noted OT note and not stable for mobility at this time.  Will cancel for today.   Reginia Naas 06/12/2015, [place], [place] 06/12/2015

## 2015-06-12 NOTE — Progress Notes (Signed)
[facility] TEAM 1 - Stepdown/ICU TEAM  CHANEL HOVEY  [id number] DOB: 05-06-1924 DOA: 06/30/2015 PCP: [name], MD  Outpatient Specialists:   Brief Narrative:  82 y.o. male with history of Alzheimer's dementia with gait disturbance and progressive cognitive decline, and coronary artery disease status post 4 vessel CABG who presented to the ER for worsening inability to walk w/ lower extremity weakness and confusion. Per Family, pt was in normal state of health until about 3 days prior to his admit when his mentation worsened and he developed bilat LE weakness w/ inability to bear weight. Family noted 02 sats ranging in the mid 48s to mid 80s. Pt is confirmed to be DNR/DNI.  In ED the pt was hypotensive and tachycardic and found to have new left basilar opacity, as well as ST depression in I/AVL.   Assessment & Plan:  Sepsis due to LLL PNA w/ severe acute hypoxic respiratory failure  Clinically the patient appears to be actively declining - the likelihood of him surviving this acute illness is exceedingly small - I have discussed this in detail with the family at bedside and they inform me they're ready to transition to comfort focused care - I will initiate a low-dose morphine drip to assure the patient is not dealing with air hunger - we will utilize boluses of anxiolytics and pain medications as needed - for now I will not attempt to transfer the patient as a suspect he is well into his dying process  Chronic Afib w/ acute RVR  Elevated troponin - CAD s/p CABG 1997  CKD  Alz Dementia w/ acute Toxic metabolic encephalopathy  Valvular heart disease - Pulm HTN  DM  HTN  DVT prophylaxis: SCDs Code Status: NO CODE  Family Communication: no family present at time of exam  Disposition Plan: comfort focused care - keep in current bed for now - morphine gtt   Consultants:  Cardiology PCCM   Procedures:  none  Antimicrobials:  Azithro 5/6 > 5/7 Rocephin 5/6 >  5/7 Zosyn 5/7 > Vanc 5/6 >   Subjective: The patient is in obvious respiratory distress.  He is tachypnea.  He is not able to respond to my questioning.  I have spoken to multiple family members in the room including 3 children and a brother.  Objective: Blood pressure 99/70, pulse 119, temperature 97.6 F (36.4 C), temperature source Axillary, resp. rate 20, height 5\' 4"  (1.626 m), weight 86.5 kg (190 lb 11.2 oz), SpO2 94 %.  Intake/Output Summary (Last 24 hours) at 06/12/15 1620 Last data filed at 06/12/15 1500  Gross per 24 hour  Intake    950 ml  Output    425 ml  Net    525 ml   Filed Weights   06/05/2015 0932 06/06/2015 1455  Weight: 81.194 kg (179 lb) 86.5 kg (190 lb 11.2 oz)    Examination: General: Severe acute respiratory distress Lungs: Very poor air movement throughout all fields with no active wheezing Cardiovascular: Tachycardic without appreciable murmur Abdomen: nondistended, soft, bowel sounds not appreciated, no appreciable mass Extremities: No significant cyanosis, or clubbing - 1+ edema bilateral lower extremities - both feet are very cold to the touch  CBC:  Recent Labs Lab 06/24/2015 0854 06/28/2015 1441 06/11/15 0311  WBC 13.1* 11.6* 12.3*  NEUTROABS 10.1*  --   --   HGB 12.5* 12.5* 11.4*  HCT 38.6* 37.6* 35.4*  MCV 95.1 97.2 94.7  PLT 226 166 0000000   Basic Metabolic Panel:  Recent Labs Lab 06/08/2015 0854 06/26/2015 1441 06/11/15 0311  NA 135  --  136  K 3.8  --  3.6  CL 97*  --  105  CO2 24  --  21*  GLUCOSE 200*  --  154*  BUN 23*  --  18  CREATININE 1.85* 1.53* 1.40*  CALCIUM 9.1  --  8.0*   GFR: Estimated Creatinine Clearance: 36.2 mL/min (by C-G formula based on Cr of 1.4).  Cardiac Enzymes:  Recent Labs Lab 06/12/2015 0854 06/30/2015 1440 07/01/2015 1906 06/11/15 0036  TROPONINI 1.74* 1.65* 1.39* 1.08*    HbA1C:  Recent Labs  06/30/2015 1440  HGBA1C 6.8*    CBG:  Recent Labs Lab 06/11/15 1234 06/11/15 1652 06/11/15 2108  06/12/15 0734 06/12/15 1216  GLUCAP 234* 204* 168* 145* 159*   Thyroid Function Tests:  Recent Labs  06/15/2015 1440  TSH 5.015*   Urine analysis:    Component Value Date/Time   COLORURINE YELLOW 06/16/2015 1000   APPEARANCEUR CLEAR 06/29/2015 1000   LABSPEC 1.011 06/15/2015 1000   PHURINE 6.0 06/30/2015 1000   GLUCOSEU NEGATIVE 06/28/2015 1000   HGBUR NEGATIVE 06/26/2015 1000   BILIRUBINUR NEGATIVE 07/05/2015 1000   KETONESUR NEGATIVE 06/12/2015 1000   PROTEINUR NEGATIVE 06/12/2015 1000   UROBILINOGEN 1.0 06/28/2014 1227   NITRITE NEGATIVE 06/16/2015 1000   LEUKOCYTESUR NEGATIVE 06/27/2015 1000    Recent Results (from the past 240 hour(s))  Blood Culture (routine x 2)     Status: None (Preliminary result)   Collection Time: 06/28/2015  9:30 AM  Result Value Ref Range Status   Specimen Description BLOOD RIGHT HAND  Final   Special Requests BOTTLES DRAWN AEROBIC AND ANAEROBIC 5CC  Final   Culture NO GROWTH 2 DAYS  Final   Report Status PENDING  Incomplete  Blood Culture (routine x 2)     Status: None (Preliminary result)   Collection Time: 06/19/2015  9:40 AM  Result Value Ref Range Status   Specimen Description BLOOD LEFT ANTECUBITAL  Final   Special Requests BOTTLES DRAWN AEROBIC AND ANAEROBIC 5CC  Final   Culture NO GROWTH 2 DAYS  Final   Report Status PENDING  Incomplete  Urine culture     Status: Abnormal   Collection Time: 06/21/2015 10:00 AM  Result Value Ref Range Status   Specimen Description URINE, RANDOM  Final   Special Requests NONE  Final   Culture MULTIPLE SPECIES PRESENT, SUGGEST RECOLLECTION (A)  Final   Report Status 06/11/2015 FINAL  Final  Culture, blood (routine x 2) Call MD if unable to obtain prior to antibiotics being given     Status: None (Preliminary result)   Collection Time: 06/27/2015  2:35 PM  Result Value Ref Range Status   Specimen Description BLOOD RIGHT HAND  Final   Special Requests IN PEDIATRIC BOTTLE 4CC  Final   Culture NO GROWTH 2  DAYS  Final   Report Status PENDING  Incomplete  MRSA PCR Screening     Status: None   Collection Time: 07/05/2015  3:00 PM  Result Value Ref Range Status   MRSA by PCR NEGATIVE NEGATIVE Final    Comment:        The GeneXpert MRSA Assay (FDA approved for NASAL specimens only), is one component of a comprehensive MRSA colonization surveillance program. It is not intended to diagnose MRSA infection nor to guide or monitor treatment for MRSA infections.      Scheduled Meds: . antiseptic oral rinse  7 mL  Mouth Rinse q12n4p  . aspirin EC  325 mg Oral Daily  . budesonide (PULMICORT) nebulizer solution  0.25 mg Nebulization BID  . chlorhexidine  15 mL Mouth Rinse BID  . digoxin  0.25 mg Intravenous Daily  . heparin  5,000 Units Subcutaneous Q8H  . insulin aspart  0-15 Units Subcutaneous TID WC  . ipratropium-albuterol  3 mL Nebulization Q6H  . memantine  10 mg Oral BID  . pantoprazole (PROTONIX) IV  40 mg Intravenous Q24H  . piperacillin-tazobactam (ZOSYN)  IV  3.375 g Intravenous Q8H  . QUEtiapine  12.5 mg Oral BID  . rosuvastatin  20 mg Oral Daily  . sodium chloride  500 mL Intravenous Once  . vancomycin  1,000 mg Intravenous Q24H   Continuous Infusions: . sodium chloride 50 mL/hr at 06/12/15 0741     LOS: 2 days   Time spent: 35 minutes   Cherene Altes, MD [facility] Office  [phone] Pager - Text Page per Shea Evans as per below:  On-Call/Text Page:      [email or web address]      password [email or web address]  If 7PM-7AM, please contact night-coverage [email or web address] Password [email or web address] 06/12/2015, 4:20 PM

## 2015-06-12 NOTE — Progress Notes (Signed)
OT Cancellation Note  Patient Details Name: [name] MRN: [id number] DOB: 1924-09-29   Cancelled Treatment:    Reason Eval/Treat Not Completed: Patient not medically ready Per nsg, pt not stable medically. Consulting palliative. Will follow up as appropriate tomorrow. [place], OTR/L  [id number] 06/12/2015 06/12/2015, 8:47 AM

## 2015-06-12 NOTE — Progress Notes (Signed)
CCMd notified nurse at 0300 that pt went into Accelerated IVR rhythm with heart rate at 103. Pt did not sustain. Patient is back in to AFIB and normal sinus with HR of 110-140's. Nurse was changing and rolling pt when he went into Accelerated IVR. Pt is still very restless requiring NRB mask. L. [place] notified.

## 2015-06-13 DIAGNOSIS — Z515 Encounter for palliative care: Secondary | ICD-10-CM | POA: Diagnosis present

## 2015-06-13 DIAGNOSIS — R0600 Dyspnea, unspecified: Secondary | ICD-10-CM | POA: Diagnosis present

## 2015-06-13 DIAGNOSIS — Z66 Do not resuscitate: Secondary | ICD-10-CM | POA: Diagnosis present

## 2015-06-13 DIAGNOSIS — I482 Chronic atrial fibrillation: Secondary | ICD-10-CM

## 2015-06-13 LAB — CG4 I-STAT (LACTIC ACID): LACTIC ACID, VENOUS: 4.67 mmol/L — AB (ref 0.5–2.0)

## 2015-06-15 ENCOUNTER — Telehealth: Payer: Self-pay | Admitting: Family Medicine

## 2015-06-15 LAB — CULTURE, BLOOD (ROUTINE X 2)
CULTURE: NO GROWTH
Culture: NO GROWTH
Culture: NO GROWTH

## 2015-06-15 NOTE — Telephone Encounter (Signed)
Patient's son,Gary,called to let Dr.Duncan know patient passed away on 2022-05-09.  Dominica Severin said his father had a long life and Dr.Duncan was part of the reason he did.

## 2015-06-16 DIAGNOSIS — A419 Sepsis, unspecified organism: Secondary | ICD-10-CM | POA: Diagnosis present

## 2015-06-16 DIAGNOSIS — I482 Chronic atrial fibrillation, unspecified: Secondary | ICD-10-CM | POA: Diagnosis present

## 2015-06-16 DIAGNOSIS — J189 Pneumonia, unspecified organism: Secondary | ICD-10-CM | POA: Diagnosis present

## 2015-06-19 NOTE — Telephone Encounter (Signed)
[name] came by today and thanked me.   I was glad to see her.  I hadn't called the family yet as [place] was [place] of [place] last [place].  I was glad to see this patient in the office.  He was a kind and pleasant man.  It should be noted that his family worked hard to provide him with good care and I told [name] they should be commended for that.  I appreciate all of those who helped care for this gentleman.

## 2015-07-06 NOTE — Discharge Summary (Signed)
Death Summary  PHALEN BONNAR [id number] DOB: Apr 04, 1924 DOA: 2015-06-14  PCP: [name], MD PCP/Office notified: No  Admit date: 2015-06-14 Date of Death: Jun 20, 2015  Final Diagnoses:  Principal Problem:   Sepsis associated hypotension ([place]) Active Problems:   Diabetes mellitus without complication ([place])   Dementia   Coronary atherosclerosis   CAP (community acquired pneumonia)   AKI (acute kidney injury) ([place])   Pneumonia   Elevated troponin   Acute respiratory failure ([place])   DNR (do not resuscitate)   Palliative care encounter   Dyspnea   Sepsis due to pneumonia ([place])   Chronic atrial fibrillation ([place])   Sepsis due to LLL PNA w/ severe acute hypoxic respiratory failure  Clinically the patient appears to be actively declining - the likelihood of him surviving this acute illness is exceedingly small - I have discussed this in detail with the family at bedside and they inform me they're ready to transition to comfort focused care - I will initiate a low-dose morphine drip to assure the patient is not dealing with air hunger - we will utilize boluses of anxiolytics and pain medications as needed - for now I will not attempt to transfer the patient as a suspect he is well into his dying process  Chronic Afib w/ acute RVR  Elevated troponin - CAD s/p CABG 1997  CKD  Alz Dementia w/ acute Toxic metabolic encephalopathy  Valvular heart disease - Pulm HTN  DM  HTN History of present illness:    Hospital Course:  82 y.o. male with history of Alzheimer's dementia with gait disturbance and progressive cognitive decline, and coronary artery disease status post 4 vessel CABG who presented to the ER for worsening inability to walk w/ lower extremity weakness and confusion. Per Family, pt was in normal state of health until about 3 days prior to his admit when his mentation worsened and he developed bilat LE weakness w/ inability to bear weight. Family noted 02 sats ranging  in the mid 15s to mid 80s. Pt is confirmed to be DNR/DNI.  In ED the pt was hypotensive and tachycardic and found to have new left basilar opacity, as well as ST depression in I/AVL.  During this admission patient was treated for sepsis secondary to LLL pneumonia, as well as chronic atrial fibrillation. Despite maximum medical treatment patient did not survive. Patient passed quietly on 2015/06/17 at 1720.  Time: 30 minutes  Signed:  Dia Crawford, MD [facility] [phone]

## 2015-07-06 NOTE — Care Management Important Message (Signed)
Important Message  Patient Details  Name: KAZEN ARMIJO MRN: [id number] Date of Birth: Aug 20, 1924   Medicare Important Message Given:  Yes    Zenon Mayo, RN 2015-06-28, 12:12 [place] Message  Patient Details  Name: TAKAI WURM MRN: [id number] Date of Birth: September 18, 1924   Medicare Important Message Given:  Yes    Zenon Mayo, RN 28-Jun-2015, 12:12 PM

## 2015-07-06 NOTE — Progress Notes (Signed)
Patient's death was pronounced by Norva Karvonen, RN and Cherly Beach, RN at [phone]. He was surrounded by his family at his time of passing. Dr. Sherral Hammers was notified. Family requested that the Chaplain come and say a prayer with them. Family thanked staff for taking care of their father.

## 2015-07-06 NOTE — Progress Notes (Signed)
   06/22/15 1200  Clinical Encounter Type  Visited With Family;Patient and family together  Visit Type Patient actively dying  Referral From Nurse  Spiritual Encounters  Spiritual Needs Prayer;Emotional;Grief support  [facility] asked by RN to visit with pt family as pt is actively dying.  [facility] prayed and consoled with family.  12:56 PM Gwynn Burly

## 2015-07-06 NOTE — Care Management Note (Signed)
Case Management Note  Patient Details  Name: [name] MRN: [id number] Date of Birth: 05-Oct-1924  Subjective/Objective:  Patient is from home with son, here with sepsis, actively dying, mottling on legs, on morphine drip, on NRB, NCM will cont to follow for dc needs.                    Action/Plan:   Expected Discharge Date:                  Expected Discharge Plan:  [place]  In-House Referral:     Discharge planning Services  CM Consult  Post Acute Care Choice:    Choice offered to:     DME Arranged:    DME Agency:     HH Arranged:    [place] Agency:     Status of Service:  In process, will continue to follow  Medicare Important Message Given:  Yes Date Medicare IM Given:    Medicare IM give by:    Date Additional Medicare IM Given:    Additional Medicare Important Message give by:     If discussed at [place] of Stay Meetings, dates discussed:    Additional Comments:  Zenon Mayo, RN 07/01/2015, 12:57 PM

## 2015-07-06 NOTE — Progress Notes (Signed)
Morphine drip of 200 mL wasted at the bedside with Cherly Beach, RN.

## 2015-07-06 DEATH — deceased

## 2015-07-13 ENCOUNTER — Other Ambulatory Visit: Payer: Medicare Other

## 2015-07-18 ENCOUNTER — Ambulatory Visit: Payer: Medicare Other | Admitting: Family

## 2015-07-18 ENCOUNTER — Encounter (HOSPITAL_COMMUNITY): Payer: Medicare Other

## 2015-07-20 ENCOUNTER — Encounter: Payer: Medicare Other | Admitting: Family Medicine

## 2015-08-29 ENCOUNTER — Ambulatory Visit: Payer: Medicare Other | Admitting: Podiatry

## 2017-10-17 IMAGING — CT CT HEAD W/O CM
2 series · 15 of 30 positions shown, 17 images · non-contrast
Comparison: 07/05/2014

CLINICAL DATA: Occipital headache beginning today. Any coagulated.
Dementia.

EXAM:
CT HEAD WITHOUT CONTRAST
TECHNIQUE: Contiguous axial images were obtained from the base of the skull
through the vertex without intravenous contrast.

[Series 2: head without · axial · non-contrast · 0.42mm/px · z∈[-109,+11]mm · 7 of 33 slices shown, 9 images]
[im 5/33  brain]
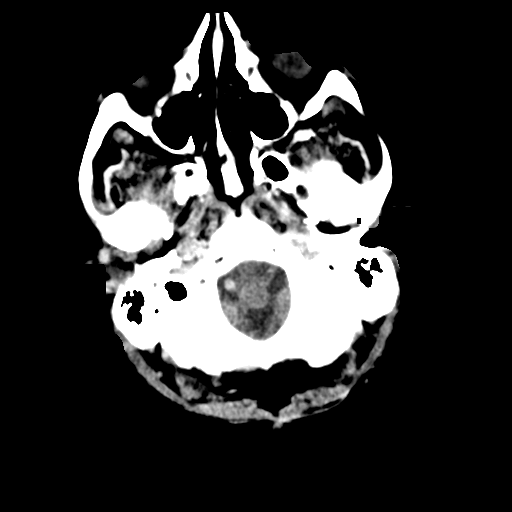
[im 5/33  bone]
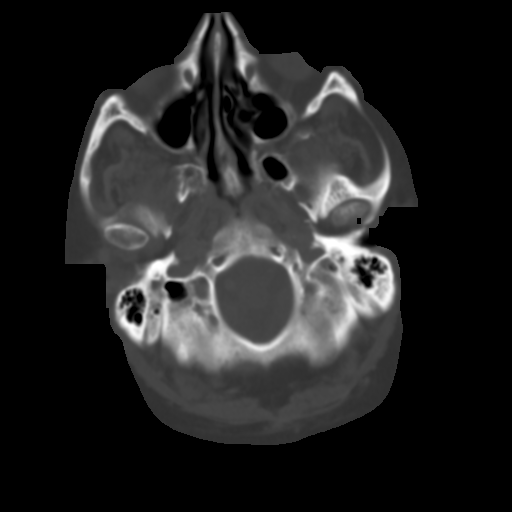
[im 9/33  brain]
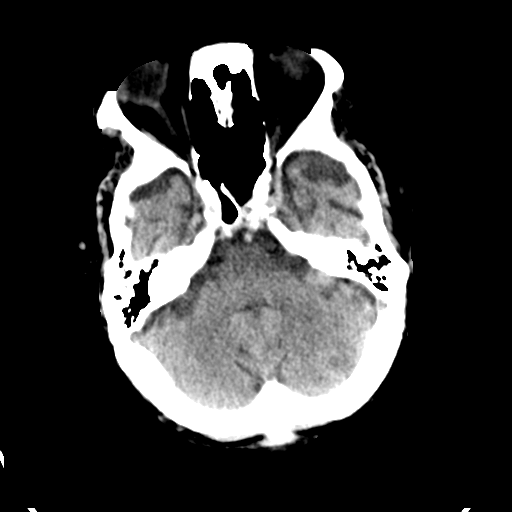
[im 13/33  brain]
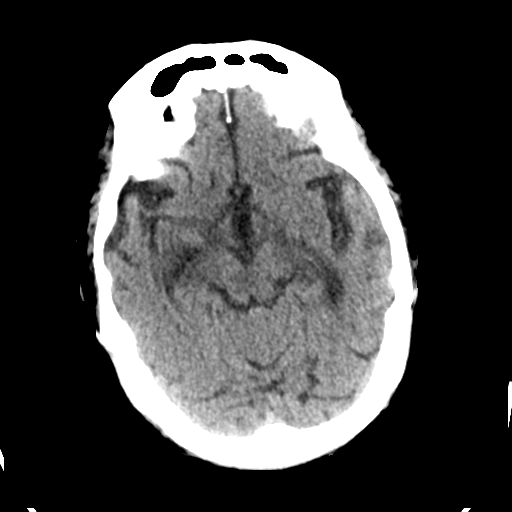
[im 17/33  brain]
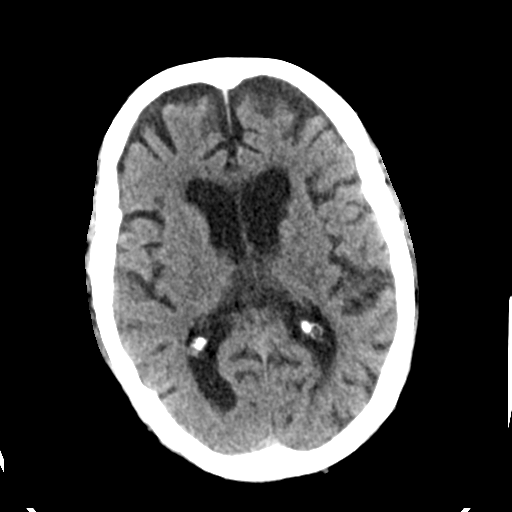
[im 21/33  brain]
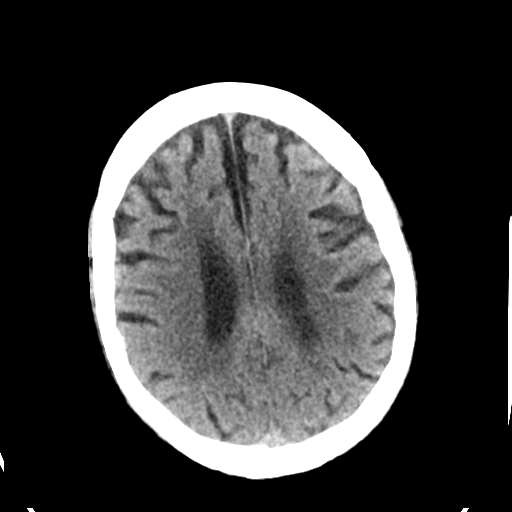
[im 21/33  bone]
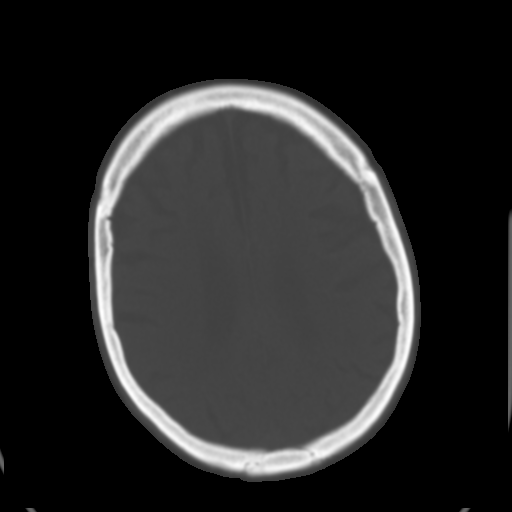
[im 25/33  brain]
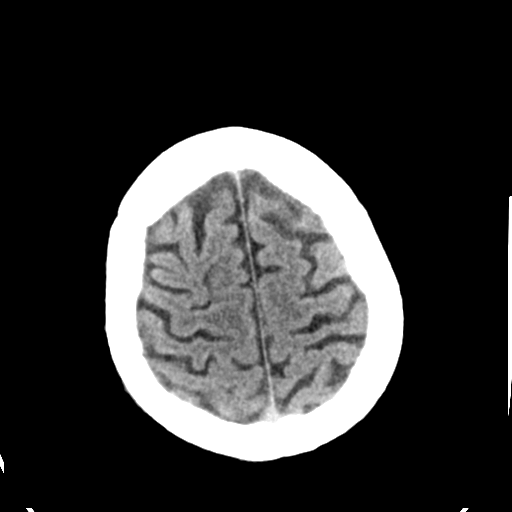
[im 29/33  brain]
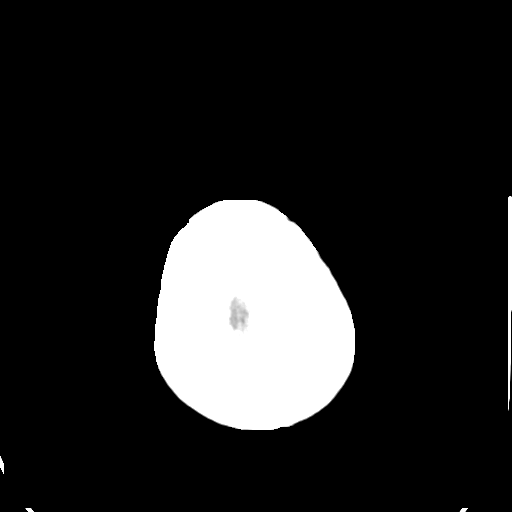

[Series 3: head bone · axial · 0.42mm/px · z∈[-113,+15]mm · 8 of 81 slices shown]
[im 9/81  bone]
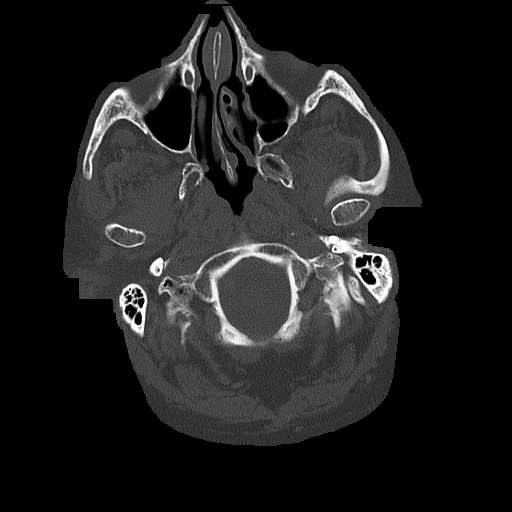
[im 17/81  bone]
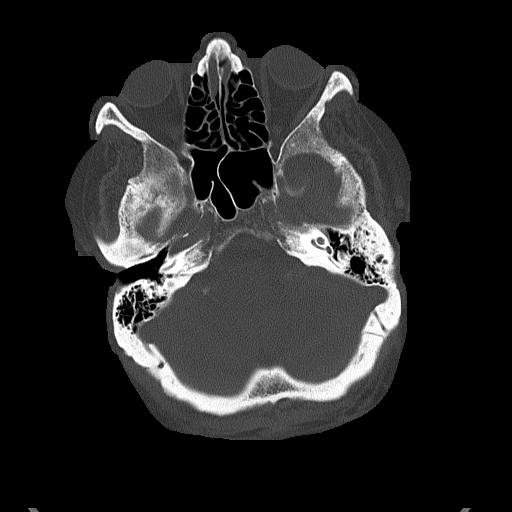
[im 25/81  bone]
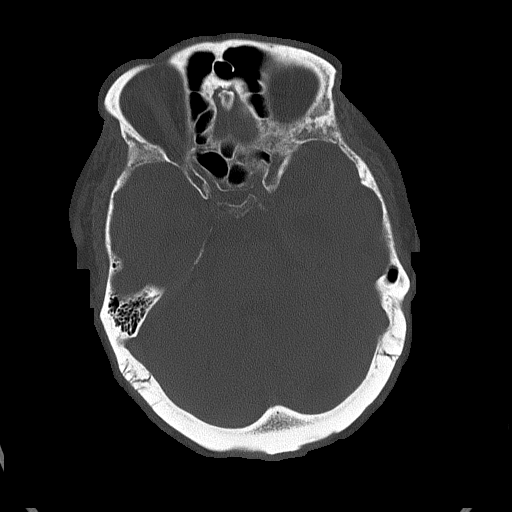
[im 37/81  bone]
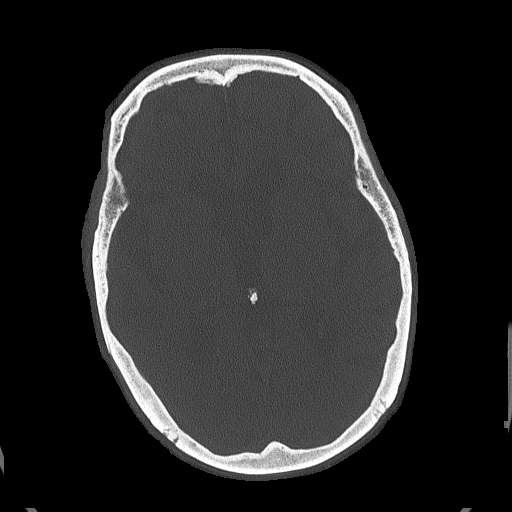
[im 45/81  bone]
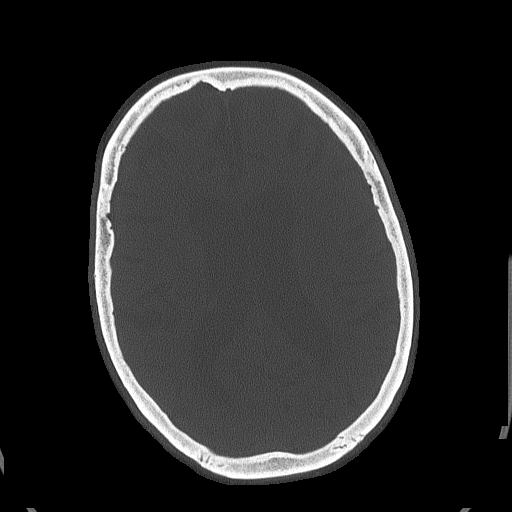
[im 57/81  bone]
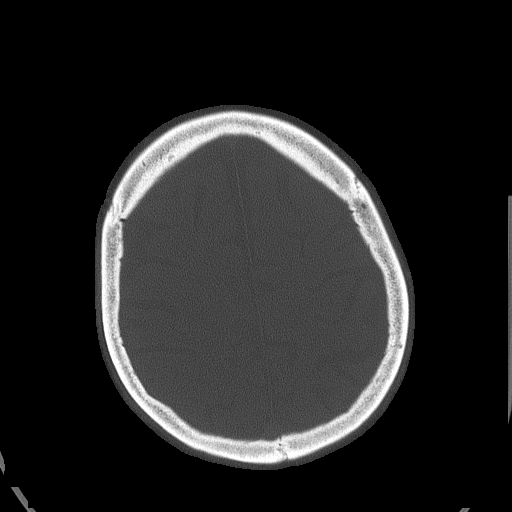
[im 65/81  bone]
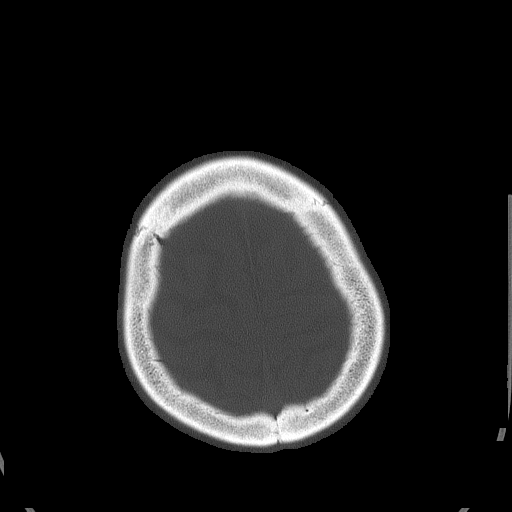
[im 73/81  bone]
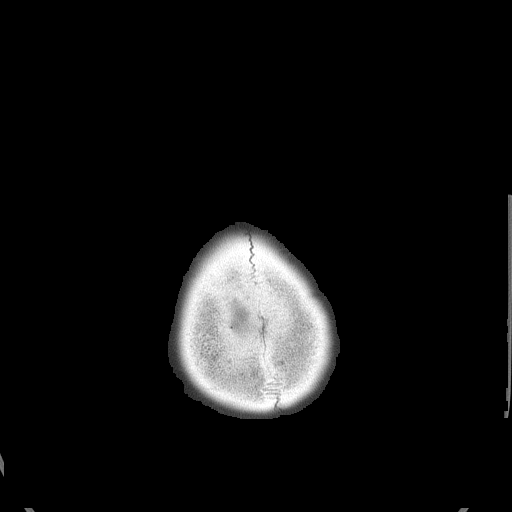

[15 of 30 positions shown; findings below may reference images not displayed]

FINDINGS: The brain shows generalized atrophy, most pronounced in the temporal
lobes. There is an old superior cerebellar infarction on the left.
Few other scattered small vessel cerebellar infarctions. Mild small
vessel change of the cerebral hemispheric white matter. No sign of
acute infarction, mass lesion, hemorrhage, hydrocephalus or
extra-axial collection. No calvarial abnormality. No evidence of
acute sinus disease. There is atherosclerotic calcification of the
major vessels at the base of the brain.
IMPRESSION: No acute finding.

Generalized atrophy, most pronounced affecting the temporal lobes.

Old cerebellar infarctions.
